# Patient Record
Sex: Female | Born: 1960
Health system: Southern US, Community
[De-identification: ages and names within clinical notes are randomized; demographics above are authoritative.]

## PROBLEM LIST (undated history)

## (undated) DIAGNOSIS — I8390 Asymptomatic varicose veins of unspecified lower extremity: Secondary | ICD-10-CM

## (undated) DIAGNOSIS — D649 Anemia, unspecified: Secondary | ICD-10-CM

## (undated) DIAGNOSIS — R112 Nausea with vomiting, unspecified: Secondary | ICD-10-CM

## (undated) DIAGNOSIS — Z9889 Other specified postprocedural states: Secondary | ICD-10-CM

## (undated) DIAGNOSIS — S76309A Unspecified injury of muscle, fascia and tendon of the posterior muscle group at thigh level, unspecified thigh, initial encounter: Secondary | ICD-10-CM

## (undated) DIAGNOSIS — E669 Obesity, unspecified: Secondary | ICD-10-CM

## (undated) DIAGNOSIS — K30 Functional dyspepsia: Secondary | ICD-10-CM

## (undated) HISTORY — DX: Asymptomatic varicose veins of unspecified lower extremity: I83.90

## (undated) HISTORY — DX: Nausea with vomiting, unspecified: R11.2

## (undated) HISTORY — PX: ENDOMETRIAL ABLATION: SHX621

## (undated) HISTORY — DX: Nausea with vomiting, unspecified: Z98.890

## (undated) HISTORY — DX: Obesity, unspecified: E66.9

## (undated) HISTORY — PX: TONSILLECTOMY: SUR1361

## (undated) HISTORY — DX: Anemia, unspecified: D64.9

## (undated) HISTORY — PX: CERVICAL DISC ARTHROPLASTY: SHX587

## (undated) HISTORY — DX: Unspecified injury of muscle, fascia and tendon of the posterior muscle group at thigh level, unspecified thigh, initial encounter: S76.309A

---
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
## 2002-11-21 DIAGNOSIS — Z9884 Bariatric surgery status: Secondary | ICD-10-CM

## 2002-11-21 HISTORY — DX: Bariatric surgery status: Z98.84

## 2002-11-21 HISTORY — PX: ENDOMETRIAL ABLATION: SHX621

## 2002-11-21 HISTORY — PX: OTHER SURGICAL HISTORY: SHX169

## 2008-01-14 ENCOUNTER — Ambulatory Visit (HOSPITAL_COMMUNITY): Admission: RE | Admit: 2008-01-14 | Discharge: 2008-01-14 | Payer: Self-pay

## 2008-01-29 ENCOUNTER — Ambulatory Visit (HOSPITAL_COMMUNITY): Admission: RE | Admit: 2008-01-29 | Discharge: 2008-01-30 | Payer: Self-pay | Admitting: Neurological Surgery

## 2011-04-05 NOTE — Op Note (Signed)
Kristie Martinez, Kristie Martinez                 ACCOUNT NO.:  [id number]   MEDICAL RECORD NO.:  [id number]          PATIENT TYPE:  INP   LOCATION:  3523                         FACILITY:  [facility]   PHYSICIAN:  Stefani Dama, M.D.  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   PROCEDURE:  Anterior cervical decompression C5-C6 and C6-C7 with partial  corpectomy, C6, arthrodesis with structural allograft, and anterior  plate fixation with an Alphatec plate.   SURGEON:  Stefani Dama, M.D.   FIRST ASSISTANT:  Clydene Fake, M.D.   ANESTHESIA:  General endotracheal.   INDICATIONS:  Kristie Martinez is a 50 year old individual who has had severe  neck, shoulder and left arm pain.  An MRI demonstrates the presence of a  large extruded fragment of disc at C6-C7 behind the body of C6 causing  severe foraminal stenosis and cord compression.  The patient was also  noted to be weak in her right arm and after careful evaluation, it was  noted that she had significant foraminal stenosis on the right side at  C5-C6.  She was advised regarding surgical decompression and was taken  to the operating room to undergo anterior decompression arthrodesis at  C5-C6 and C6-C7 and corpectomy that was initially planned for a full  decompressive corpectomy. At the time of surgery, it was noted that the  disc herniation extruded itself nicely into the disc space and with a  minimal partial corpectomy, I was able to relieve the compression  adequately with the nerve hook being flipped up onto the vertebra of C6.   PROCEDURE NOTE:   The patient was brought to the operating room supine on  the stretcher.  She was placed on the table in a supine position. After  the smooth induction of general endotracheal anesthesia, she was placed  in 5 pounds of halter traction.  Her neck was prepped with alcohol and  DuraPrep and draped in a sterile fashion.  A transverse incision was  made in the base of the neck on the left side and dissection was carried  down through the platysma.  The plane between the sternocleidomastoid  and strap muscles was dissected bluntly until the prevertebral space was  reached.  The first identifiable disc space was noted to be that of the  C6-C7 on a localizing radiograph.  The longus coli muscle was then  stripped off either side of the midline and a self-retaining Caspar  retractor was placed in the wound.  A discectomy was undertaken at C6-  C7.  As the region of the posterior longitudinal ligament was reached,  on the left side, there was noted to be some fragments of disc that were  extruded on the ligament and these were retrieved. Then,  by using small  nerve hooks, some further fragments could be retrieved from this region.  The under surface of the vertebra at C6 was then undercut approximately  3 mm with a high speed bur.  A fine nerve hook could then be slipped  into this area and several other rather large fragments of disc were  retrieved from this region. With this decompression being obtained, the  decompression was obtained across the disc space in a similar fashion.  Attention was then turned to C5-C6 were a similar discectomy was  undertaken and once the region of the posterior longitudinal ligament  was reached, the ligament, itself, was opened and decompression under  the left side of the vertebral body of C6 was undertaken with a small  nerve hook. Several passes yielded no other fragments of disc down to  the vertebral body of C7.  Then, the decompression was undertaken on the   right side at C5-C6 where there was noted to be a substantial bone spur  causing compromise for the right C6 nerve root.  This was drilled down  carefully with a 1 mm Kerrison punch.  The bone fragments were removed.  The nerve root was well decompressed.  Once this was accomplished,  hemostasis was accomplished. Because an incomplete corpectomy was  performed, it was felt that was felt that the interspace could be  stabilized with two standard allografts. These were 8 mm grafts that  were cut and fit to the appropriate size.  They were fit with some  VITOSS bone substitute.  An anterior plate was then affixed with 34 mm  plate using 14 mm screws.  Final alignment was checked radiographically  and was found to be good.  The area was checked for hemostasis and then  the wound was closed with 3-0 Vicryl in the platysma and 3-0 Vicryl  subcuticularly.  The patient tolerated procedure well and was returned  to the recovery room in stable condition.      Stefani Dama, M.D.  Electronically Signed     HJE/MEDQ  D:  01/29/2008  T:  01/30/2008  Job:  [id number]

## 2011-07-15 ENCOUNTER — Inpatient Hospital Stay (INDEPENDENT_AMBULATORY_CARE_PROVIDER_SITE_OTHER)
Admission: RE | Admit: 2011-07-15 | Discharge: 2011-07-15 | Disposition: A | Discharge status: Home / Self Care | Payer: 59 | Source: Ambulatory Visit | Attending: Emergency Medicine | Admitting: Emergency Medicine

## 2011-07-15 DIAGNOSIS — J019 Acute sinusitis, unspecified: Secondary | ICD-10-CM

## 2011-07-15 DIAGNOSIS — J40 Bronchitis, not specified as acute or chronic: Secondary | ICD-10-CM

## 2011-08-15 LAB — BASIC METABOLIC PANEL
BUN: 8
CO2: 27
Chloride: 107
Creatinine, Ser: 0.69
Potassium: 4.4

## 2011-08-15 LAB — CBC
HCT: 38.7
MCHC: 34.5
MCV: 90.3
Platelets: 331
WBC: 8.5

## 2012-05-16 ENCOUNTER — Encounter: Payer: Self-pay | Admitting: Vascular Surgery

## 2012-05-17 ENCOUNTER — Encounter: Payer: Self-pay | Admitting: Vascular Surgery

## 2012-05-17 ENCOUNTER — Ambulatory Visit (INDEPENDENT_AMBULATORY_CARE_PROVIDER_SITE_OTHER): Payer: 59 | Admitting: *Deleted

## 2012-05-17 ENCOUNTER — Ambulatory Visit (INDEPENDENT_AMBULATORY_CARE_PROVIDER_SITE_OTHER): Payer: 59 | Admitting: Vascular Surgery

## 2012-05-17 VITALS — BP 124/86 | HR 93 | Resp 18 | Ht 66.5 in | Wt 172.0 lb

## 2012-05-17 DIAGNOSIS — I83893 Varicose veins of bilateral lower extremities with other complications: Secondary | ICD-10-CM

## 2012-05-17 NOTE — Progress Notes (Signed)
[facility] [place] HISTORY AND PHYSICAL   History of Present Illness:  Patient is a 51 y.o. year old female who presents for evaluation of pain aching and burning of both lower extremities. She has developed several areas of varicose veins in her lower extremities of the last several years. Her primary complaint is a burning and aching in her legs that occurs at the end of the day and disrupture sleep. She has some occasional swelling. The veins are more prominent on some days than others. She works as a Engineer, civil (consulting) in the emergency room and is on her feet most of the day during those shifts.  She has not had any prior interventions in her lower extremities. 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   Past Medical History  Diagnosis Date  . Varicose veins     Past Surgical History  Procedure Date  . Cervical disc arthroplasty      Social History History  Substance Use Topics  . Smoking status: Never Smoker   . Smokeless tobacco: Never Used  . Alcohol Use: Yes    Family History No family history on file.  Allergies  No Known Allergies   Current Outpatient Prescriptions  Medication Sig Dispense Refill  . calcium carbonate (TUMS - DOSED IN MG ELEMENTAL CALCIUM) 500 MG chewable tablet Chew 1 tablet by mouth daily.        ROS:   General:  No weight loss, Fever, chills  HEENT: No recent headaches, no nasal bleeding, no visual changes, no sore throat  Neurologic: No dizziness, blackouts, seizures. No recent symptoms of stroke or mini- stroke. No recent episodes of slurred speech, or temporary blindness.  Cardiac: No recent episodes of chest pain/pressure, no shortness of breath at rest.  No shortness of breath with exertion.  Denies history of atrial fibrillation or irregular heartbeat  Vascular: No history of rest pain in feet.  No history of claudication.  No history of non-healing  ulcer, No history of DVT   Pulmonary: No home oxygen, no productive cough, no hemoptysis,  No asthma or wheezing  Musculoskeletal:  [ ]  Arthritis, [ ]  Low back pain,  [ ]  Joint pain  Hematologic:No history of hypercoagulable state.  No history of easy bleeding.  No history of anemia  Gastrointestinal: No hematochezia or melena,  No gastroesophageal reflux, no trouble swallowing  Urinary: [ ]  chronic Kidney disease, [ ]  on HD - [ ]  MWF or [ ]  TTHS, [ ]  Burning with urination, [ ]  Frequent urination, [ ]  Difficulty urinating;   Skin: No rashes  Psychological: No history of anxiety,  No history of depression   Physical Examination  Filed Vitals:   05/17/12 1246  BP: 124/86  Pulse: 93  Resp: 18  Height: 5' 6.5" (1.689 m)  Weight: 172 lb (78.019 kg)    Body mass index is 27.35 kg/(m^2).  General:  Alert and oriented, no acute distress HEENT: Normal Neck: No bruit or JVD Pulmonary: Clear to auscultation bilaterally Cardiac: Regular Rate and Rhythm without murmur Abdomen: Soft, non-tender, non-distended, no mass Skin: No rash, scattered spider varicosities on the anterior and lateral thigh bilaterally also along the lateral anterior calf, prominent varicosities in the anterior calf area right leg when standing, palpable varicosities in the left popliteal area on standing Extremity Pulses:  2+ radial, brachial, femoral, dorsalis pedis pulses bilaterally Musculoskeletal: No deformity or edema  Neurologic: Upper  and lower extremity motor 5/5 and symmetric  DATA: She had a venous duplex exam today which I reviewed and interpreted. This showed greater saphenous reflux bilaterally. She had very mild deep reflux.   ASSESSMENT:   Symptomatic varicose veins with evidence of greater saphenous reflux bilaterally.   PLAN:  The patient was given a prescription today for 5 high compression stockings bilaterally. She will take Tylenol intermittently for pain relief. She was counseled in  elevating her legs when she has swelling.  She will followup in 3 months time with Dr. Arbie Cookey to consider laser ablation of her saphenous veins bilaterally.  Fabienne Bruns, MD [facility] [place] Office: [phone] Pager: [phone]

## 2012-05-31 NOTE — Procedures (Unsigned)
LOWER EXTREMITY VENOUS REFLUX EXAM  INDICATION:  Bilateral throbbing and ropey varicose veins.  EXAM:  Using color-flow imaging and pulse Doppler spectral analysis, the right and left common femoral, femoral, popliteal, posterior tibial, great and small saphenous veins were evaluated.  There is evidence suggesting deep venous insufficiency in the right and the left lower extremity.  The right and left saphenofemoral junction is not competent with Reflux of >586milliseconds. The right and left greater saphenous vein is not competent with reflux of >52milliseconds with the caliber as described below.   The right and left proximal small saphenous vein demonstrates competency.  GSV Diameter (used if found to be incompetent only)                                           Right    Left Proximal Greater Saphenous Vein           0.89 cm  0.79 cm Proximal-to-mid-thigh                     0.38 cm  0.34 cm Mid thigh                                 0.29 cm  0.42 cm Mid-distal thigh                          0.33 cm  0.29 cm Distal thigh                              cm       cm Knee                                      0.23 cm  0.38 cm  IMPRESSION: 1. Right and left greater saphenous vein is not competent with reflux     of >514milliseconds. 2. The right and left great saphenous vein is not tortuous. 3. The deep venous system is not competent with reflux of     >556milliseconds. 4. The right and left small saphenous vein is competent.  ___________________________________________ Kristie Hora. Fields, MD  EM/MEDQ  D:  05/17/2012  T:  05/17/2012  Job:  [id number]

## 2012-08-21 ENCOUNTER — Encounter: Payer: Self-pay | Admitting: Vascular Surgery

## 2012-08-21 ENCOUNTER — Ambulatory Visit (INDEPENDENT_AMBULATORY_CARE_PROVIDER_SITE_OTHER): Payer: 59 | Admitting: Vascular Surgery

## 2012-08-21 VITALS — BP 112/72 | HR 73 | Resp 18 | Ht 66.0 in | Wt 179.2 lb

## 2012-08-21 DIAGNOSIS — I83893 Varicose veins of bilateral lower extremities with other complications: Secondary | ICD-10-CM

## 2012-08-21 NOTE — Progress Notes (Signed)
Problems with Activities of Daily Living Secondary to Leg Pain  1. Kristie Martinez has had to stop walking for exercise due to leg pain.  2. Kristie Martinez is unable to walk the stairs at work due to leg pain.  3. 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    Failure of  Conservative Therapy:  1. Worn 20-30 mm Hg thigh high compression hose >3 months with no relief of symptoms.  2. Frequently elevates legs-no relief of symptoms  3. Taken Ibuprofen 600 Mg TID with no relief of symptoms.  The patient has today for further discussion regarding her symptomatic venous hypertension. 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 She reports that she has continued to have aching discomfort in her calves. This is with prolonged standing she hasn't radial aching and she's been up on her feet it during the night. Reviewed her venous duplex in her reimage her veins with SonoSite. This does show enlarged refluxing great saphenous vein bilaterally. On the left side she has typical anatomy on the right side she has a bifurcated system with a more superficial and a more standard location of her great saphenous vein on the right. I recommended ablation of the standard anatomic positioning of her great saphenous vein on the right since this appears to be responsible for the reflux into the tributaries in her calf. We would then have a staged left great saphenous vein laser ablation. She understands this is an outpatient in our office under local anesthesia. We discussed the procedure including a very slight risk for DVT. She wish to proceed as soon as we can get her on the schedule

## 2012-09-04 ENCOUNTER — Other Ambulatory Visit: Payer: Self-pay | Admitting: *Deleted

## 2012-09-04 DIAGNOSIS — I83893 Varicose veins of bilateral lower extremities with other complications: Secondary | ICD-10-CM

## 2012-09-26 ENCOUNTER — Encounter: Payer: Self-pay | Admitting: Vascular Surgery

## 2012-09-27 ENCOUNTER — Ambulatory Visit (INDEPENDENT_AMBULATORY_CARE_PROVIDER_SITE_OTHER): Payer: 59 | Admitting: Vascular Surgery

## 2012-09-27 ENCOUNTER — Encounter: Payer: Self-pay | Admitting: Vascular Surgery

## 2012-09-27 VITALS — BP 112/76 | HR 73 | Resp 18 | Ht 66.0 in | Wt 178.0 lb

## 2012-09-27 DIAGNOSIS — I83893 Varicose veins of bilateral lower extremities with other complications: Secondary | ICD-10-CM

## 2012-09-27 HISTORY — PX: ENDOVENOUS ABLATION SAPHENOUS VEIN W/ LASER: SUR449

## 2012-09-27 NOTE — Progress Notes (Signed)
Laser Ablation Procedure      Date: 09/27/2012    Kristie Martinez DOB:1961-03-08  Consent signed: Yes  Surgeon:T.F. Clary Meeker  Procedure: Laser Ablation: left Greater Saphenous Vein  BP 112/76  Pulse 73  Resp 18  Ht 5\' 6"  (1.676 m)  Wt 178 lb (80.74 kg)  BMI 28.73 kg/m2  Start time: 10:50AM   End time: 11:50AM  Tumescent Anesthesia: 475 cc 0.9% NaCl with 50 cc Lidocaine HCL with 1% Epi and 15 cc 8.4% NaHCO3  Local Anesthesia: 3 cc Lidocaine HCL and NaHCO3 (ratio 2:1)  Continuous Mode: 15 Watts Total Energy 2524 Joules Total Time2:48       Patient tolerated procedure well: Yes    Description of Procedure:  After marking the course of the saphenous vein and the secondary varicosities in the standing position, the patient was placed on the operating table in the supine position, and the left leg was prepped and draped in sterile fashion. Local anesthetic was administered, and under ultrasound guidance the saphenous vein was accessed with a micro needle and guide wire; then the micro puncture sheath was placed. A guide wire was inserted to the saphenofemoral junction, followed by a 5 french sheath.  The position of the sheath and then the laser fiber below the junction was confirmed using the ultrasound and visualization of the aiming beam.  Tumescent anesthesia was administered along the course of the saphenous vein using ultrasound guidance. Protective laser glasses were placed on the patient, and the laser was fired at at 15 watt continuous mode.  For a total of 2524 joules.  A steri strip was applied to the puncture site.      ABD pads and thigh high compression stockings were applied.  Ace wrap bandages were applied  at the top of the saphenofemoral junction.  Blood loss was less than 15 cc.  The patient ambulated out of the operating room having tolerated the procedure well.

## 2012-10-01 ENCOUNTER — Encounter: Payer: Self-pay | Admitting: Vascular Surgery

## 2012-10-02 ENCOUNTER — Telehealth: Payer: Self-pay | Admitting: *Deleted

## 2012-10-02 NOTE — Telephone Encounter (Signed)
10/02/2012  Time: 8:38 AM   Patient Name: Kristie Martinez  Patient of: T.F. Early  Procedure:Laser Ablation left  Greater saphenous vein 09-27-2012 Reached patient at home and checked  Her status  Yes    Comments/Actions Taken: [name] has no  compliants of swelling.  Has occasional throbbing pain over left inner thigh relieved by Ibuprofen.  Slept well. 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  Reviewed post procedural instructions.  Reminded her of post laser ablation duplex and follow up appointment with Dr. Arbie Cookey on 10-04-2012.    @SIGNATURE @

## 2012-10-03 ENCOUNTER — Encounter: Payer: Self-pay | Admitting: Vascular Surgery

## 2012-10-04 ENCOUNTER — Ambulatory Visit (INDEPENDENT_AMBULATORY_CARE_PROVIDER_SITE_OTHER): Payer: 59 | Admitting: Vascular Surgery

## 2012-10-04 ENCOUNTER — Encounter (INDEPENDENT_AMBULATORY_CARE_PROVIDER_SITE_OTHER): Payer: 59 | Admitting: *Deleted

## 2012-10-04 ENCOUNTER — Encounter: Payer: Self-pay | Admitting: Vascular Surgery

## 2012-10-04 VITALS — BP 118/80 | HR 87 | Resp 18 | Ht 66.0 in | Wt 178.0 lb

## 2012-10-04 DIAGNOSIS — I83893 Varicose veins of bilateral lower extremities with other complications: Secondary | ICD-10-CM

## 2012-10-04 NOTE — Progress Notes (Signed)
The patient presents today for followup of her left leg laser ablation one week ago. She had minimal bruising and mild discomfort following the procedure. She has been compliant with her compression garments.  Physical exam reveals mild bruising in the mid thigh. There is some thickening as expected at her thrombosed saphenous vein at the level of the knee.  Venous duplex today he was evaluated interpreter myself. This does show closure of the great saphenous vein from the insertion site just below the knee to partially 1 cm below the saphenofemoral junction. There is no evidence of DVT  Impression and plan: Successful ablation of left great saphenous vein for venous hypertension. She is scheduled for similar treatment in her right leg in the mid December. She will continue her compression garment on the left leg for one additional week and then discontinue this use.

## 2012-11-07 ENCOUNTER — Encounter: Payer: Self-pay | Admitting: Vascular Surgery

## 2012-11-08 ENCOUNTER — Ambulatory Visit (INDEPENDENT_AMBULATORY_CARE_PROVIDER_SITE_OTHER): Payer: 59 | Admitting: Vascular Surgery

## 2012-11-08 ENCOUNTER — Encounter: Payer: Self-pay | Admitting: Vascular Surgery

## 2012-11-08 VITALS — BP 106/73 | HR 72 | Resp 18 | Ht 66.0 in | Wt 178.0 lb

## 2012-11-08 DIAGNOSIS — I83893 Varicose veins of bilateral lower extremities with other complications: Secondary | ICD-10-CM

## 2012-11-08 HISTORY — PX: ENDOVENOUS ABLATION SAPHENOUS VEIN W/ LASER: SUR449

## 2012-11-08 NOTE — Progress Notes (Signed)
Laser Ablation Procedure      Date: 11/08/2012    Kristie Martinez DOB:[birth year]  Consent signed: Yes  Surgeon:T.F. Early  Procedure: Laser Ablation: right Greater Saphenous Vein  BP 106/73  Pulse 72  Resp 18  Ht 5\' 6"  (1.676 m)  Wt 178 lb (80.74 kg)  BMI 28.73 kg/m2  Start time: 10:50AM   End time: 11:55AM  Tumescent Anesthesia: 425 cc 0.9% NaCl with 50 cc Lidocaine HCL with 1% Epi and 15 cc 8.4% NaHCO3  Local Anesthesia: 4 cc Lidocaine HCL and NaHCO3 (ratio 2:1)  Continuous Mode: 15 Watts Total Energy 1908 Joules Total Time2:07       Patient tolerated procedure well: Yes    Description of Procedure:  After marking the course of the saphenous vein and the secondary varicosities in the standing position, the patient was placed on the operating table in the supine position, and the right leg was prepped and draped in sterile fashion. Local anesthetic was administered, and under ultrasound guidance the saphenous vein was accessed with a micro needle and guide wire; then the micro puncture sheath was placed. A guide wire was inserted to the saphenofemoral junction, followed by a 5 french sheath.  The position of the sheath and then the laser fiber below the junction was confirmed using the ultrasound and visualization of the aiming beam.  Tumescent anesthesia was administered along the course of the saphenous vein using ultrasound guidance. Protective laser glasses were placed on the patient, and the laser was fired at at 15 watt continuous mode.  For a total of 1908 joules.  A steri strip was applied to the puncture site.    ABD pads and thigh high compression stockings were applied.  Ace wrap bandages were applied  at the top of the saphenofemoral junction.  Blood loss was less than 15 cc.  The patient ambulated out of the operating room having tolerated the procedure well.  The patient had an eventful laser ablation of her right saphenous vein. She had a similar treatment of  her left great saphenous vein several weeks ago. She had multiple branching pattern of her saphenous vein right after the saphenofemoral junction. The most prominent of this it was heading down to the area of discomfort was chosen and she had a treatment from just at the level of the knee to just below the saphenofemoral junction. She will see [place] in one week for repeat duplex to confirm closure of this saphenous vein

## 2012-11-09 ENCOUNTER — Encounter: Payer: Self-pay | Admitting: Surgery

## 2012-11-09 ENCOUNTER — Telehealth: Payer: Self-pay | Admitting: *Deleted

## 2012-11-09 NOTE — Telephone Encounter (Signed)
11/09/2012  Time: 11:20 AM   Patient Name: Kristie Martinez  Patient of: T.F. Early  Procedure:Laser Ablation right greater saphenous vein  11-08-2012  Reached patient at home and checked  Her status  Yes    Comments/Actions Taken: Mrs. Lorey states no problems with pain or swelling.  She states she noticed a small amount of bleeding around the insertion site(right inner knee) which was controlled and stopped with compression.  Reviewed post procedural instructions with her and reminded her of post laser ablation duplex and follow up appointment with Dr. Myra Gianotti on 11-12-2012.     @SIGNATURE @

## 2012-11-12 ENCOUNTER — Ambulatory Visit (INDEPENDENT_AMBULATORY_CARE_PROVIDER_SITE_OTHER): Payer: 59 | Admitting: Surgery

## 2012-11-12 ENCOUNTER — Encounter (INDEPENDENT_AMBULATORY_CARE_PROVIDER_SITE_OTHER): Payer: 59 | Admitting: *Deleted

## 2012-11-12 ENCOUNTER — Encounter: Payer: Self-pay | Admitting: Surgery

## 2012-11-12 ENCOUNTER — Ambulatory Visit: Payer: 59 | Admitting: Surgery

## 2012-11-12 VITALS — BP 119/77 | HR 75 | Ht 66.0 in | Wt 184.0 lb

## 2012-11-12 DIAGNOSIS — Z48812 Encounter for surgical aftercare following surgery on the circulatory system: Secondary | ICD-10-CM

## 2012-11-12 DIAGNOSIS — I83893 Varicose veins of bilateral lower extremities with other complications: Secondary | ICD-10-CM

## 2012-11-12 NOTE — Progress Notes (Signed)
The patient is one-week status post laser ablation of her right great saphenous vein. She is previously undergone laser ablation of the left great saphenous vein. These procedures were done by Dr. early. The right leg was done 1 week ago. This is her followup visit. She states that her right leg hurt significantly less than the left. She has been wearing her compression garments.  On physical examination there is mild ecchymosis in the mid side. There is minimal tenderness.  Duplex ultrasound was reviewed by myself. This shows ablation of the great saphenous vein up to 1.8 cm distal to the saphenofemoral junction. There are additional branches of the saphenous vein which remained patent but did not show any evidence of reflux.  The patient has been encouraged to wear her compression garments. She will monitor the varicosities in the left leg and contact us within 3-6 months if she wishes to have these injected.

## 2012-11-20 ENCOUNTER — Other Ambulatory Visit: Payer: 59 | Admitting: Vascular Surgery

## 2013-01-05 ENCOUNTER — Other Ambulatory Visit: Payer: Self-pay

## 2013-01-18 ENCOUNTER — Emergency Department (HOSPITAL_COMMUNITY)
Admission: EM | Admit: 2013-01-18 | Discharge: 2013-01-18 | Disposition: A | Discharge status: Home / Self Care | Payer: 59 | Source: Home / Self Care | Attending: Family Medicine | Admitting: Family Medicine

## 2013-01-18 ENCOUNTER — Encounter (HOSPITAL_COMMUNITY): Payer: Self-pay | Admitting: Emergency Medicine

## 2013-01-18 DIAGNOSIS — S76011A Strain of muscle, fascia and tendon of right hip, initial encounter: Secondary | ICD-10-CM

## 2013-01-18 DIAGNOSIS — IMO0002 Reserved for concepts with insufficient information to code with codable children: Secondary | ICD-10-CM

## 2013-01-18 MED ORDER — METHYLPREDNISOLONE 4 MG PO KIT: PACK | ORAL | Status: DC

## 2013-01-18 MED ORDER — CYCLOBENZAPRINE HCL 5 MG PO TABS: 5.0000 mg | ORAL_TABLET | Freq: Three times a day (TID) | ORAL | Status: DC | PRN

## 2013-01-18 NOTE — ED Notes (Signed)
Right buttocks pain worsening over the past few weeks.  No complaints while standing or during exercises.  Notices pain with squatting, sitting, or crossing legs .  Several weeks ago recalls assisting her mother, who had fallen.  Had pain, pain went away, but pain is returning and increasing.

## 2013-01-18 NOTE — ED Provider Notes (Signed)
History     CSN: [id number]  Arrival date & time 01/18/13  1147   First MD Initiated Contact with Patient 01/18/13 1152      Chief Complaint  Patient presents with  . Back Pain    (Consider location/radiation/quality/duration/timing/severity/associated sxs/prior treatment) Patient is a 52 y.o. female presenting with back pain. The history is provided by the patient.  Back Pain Location:  Gluteal region Quality:  Shooting Radiates to:  Does not radiate Pain severity:  Mild Onset quality:  Gradual (onset lifting mother up who had fallen 2 weeks ago,) Duration:  2 weeks Timing:  Intermittent Progression:  Waxing and waning Chronicity:  New Context comment:  Worse with heat and certain positions, able to exercise,, no neuro sx, no leg swelling, no back pain, Relieved by:  Heating pad and NSAIDs Ineffective treatments:  NSAIDs Associated symptoms: no abdominal pain, no bladder incontinence, no bowel incontinence, no numbness, no paresthesias and no pelvic pain     Past Medical History  Diagnosis Date  . Varicose veins     Past Surgical History  Procedure Laterality Date  . Cervical disc arthroplasty    . Mini gastric  2004  . Endovenous ablation saphenous vein w/ laser  09-27-2012    left greater saphenous vein  by Gretta Began MD  . Endovenous ablation saphenous vein w/ laser  11-08-2012    right greater saphenous vein   by Gretta Began MD    No family history on file.  History  Substance Use Topics  . Smoking status: Never Smoker   . Smokeless tobacco: Never Used  . Alcohol Use: Yes    OB History   Grav Para Term Preterm Abortions TAB SAB Ect Mult Living                  Review of Systems  Constitutional: Negative.   Gastrointestinal: Negative.  Negative for abdominal pain and bowel incontinence.  Genitourinary: Negative for bladder incontinence and pelvic pain.  Musculoskeletal: Positive for back pain.  Skin: Negative.   Neurological: Negative for  numbness and paresthesias.    Allergies  Review of patient's allergies indicates no known allergies.  Home Medications   Current Outpatient Rx  Name  Route  Sig  Dispense  Refill  . meloxicam (MOBIC) 7.5 MG tablet   Oral   Take 7.5 mg by mouth daily.         . calcium carbonate (TUMS - DOSED IN MG ELEMENTAL CALCIUM) 500 MG chewable tablet   Oral   Chew 1 tablet by mouth daily.         . cyclobenzaprine (FLEXERIL) 5 MG tablet   Oral   Take 1 tablet (5 mg total) by mouth 3 (three) times daily as needed for muscle spasms.   30 tablet   0   . methylcellulose oral powder   Oral   Take by mouth.         . methylPREDNISolone (MEDROL DOSEPAK) 4 MG tablet      follow package directions, start today and take until finished.   21 tablet   0     BP 104/58  Pulse 95  Temp(Src) 98.1 F (36.7 C) (Oral)  Resp 20  SpO2 98%  Physical Exam  Nursing note and vitals reviewed. Constitutional: She is oriented to person, place, and time. She appears well-developed and well-nourished. No distress.  Abdominal: Soft. Bowel sounds are normal.  Musculoskeletal: She exhibits tenderness.  Right gluteal soreness, increased with stretch maneuvers.  Pulses 2+,   Neurological: She is alert and oriented to person, place, and time. She has normal reflexes. No cranial nerve deficit. Coordination normal.  Skin: Skin is warm and dry.    ED Course  Procedures (including critical care time)  Labs Reviewed - No data to display No results found.   1. Muscle strain of gluteal region, right, initial encounter       MDM          Linna Hoff, MD 01/18/13 1240

## 2013-03-07 DIAGNOSIS — Z9884 Bariatric surgery status: Secondary | ICD-10-CM | POA: Insufficient documentation

## 2013-09-26 ENCOUNTER — Other Ambulatory Visit: Payer: Self-pay

## 2015-12-30 MED FILL — PHENTERMINE 37.5 MG TABLET: 37.5 | 30 days supply | Qty: 30 | Fill #0

## 2016-01-13 ENCOUNTER — Telehealth: Payer: 59 | Admitting: Physician Assistant

## 2016-01-13 DIAGNOSIS — J019 Acute sinusitis, unspecified: Secondary | ICD-10-CM

## 2016-01-14 MED ORDER — AMOXICILLIN-POT CLAVULANATE 875-125 MG PO TABS: 1.0000 | ORAL_TABLET | Freq: Two times a day (BID) | ORAL | Status: DC

## 2016-01-14 NOTE — Progress Notes (Signed)

## 2016-01-19 ENCOUNTER — Telehealth: Payer: 59 | Admitting: Family

## 2016-01-19 DIAGNOSIS — R112 Nausea with vomiting, unspecified: Secondary | ICD-10-CM

## 2016-01-19 MED ORDER — PROMETHAZINE HCL 12.5 MG PO TABS: 12.5000 mg | ORAL_TABLET | Freq: Three times a day (TID) | ORAL | Status: DC | PRN

## 2016-01-19 MED FILL — PROMETHAZINE 12.5 MG TABLET: 12.5 | 5 days supply | Qty: 30 | Fill #0

## 2016-01-19 NOTE — Progress Notes (Signed)
We are sorry that you are not feeling well.  Here is how we plan to help!  Based on what you have shared with me it looks like you have Acute Infectious Diarrhea.  Most cases of acute diarrhea are due to infections with virus and bacteria and are self-limited conditions lasting less than 14 days.  For your symptoms you may take Imodium 2 mg tablets that are over the counter at your local pharmacy. Take two tablet now and then one after each loose stool up to 6 a day.  Antibiotics are not needed for most people with diarrhea.  As we discussed via telephone, I have e-prescribed phenergan 12.5mg , take 1 to 2 tabs by mouth every eight hours as needed for nausea or vomiting.  Marland Kitchen  HOME CARE  We recommend changing your diet to help with your symptoms for the next few days.  Drink plenty of fluids that contain water salt and sugar. Sports drinks such as Gatorade may help.   You may try broths, soups, bananas, applesauce, soft breads, mashed potatoes or crackers.   You are considered infectious for as long as the diarrhea continues. Hand washing or use of alcohol based hand sanitizers is recommend.  It is best to stay out of work or school until your symptoms stop.   GET HELP RIGHT AWAY  If you have dark yellow colored urine or do not pass urine frequently you should drink more fluids.    If your symptoms worsen   If you feel like you are going to pass out (faint)  You have a new problem  MAKE SURE YOU   Understand these instructions.  Will watch your condition.  Will get help right away if you are not doing well or get worse.  Your e-visit answers were reviewed by a board certified advanced clinical practitioner to complete your personal care plan.  Depending on the condition, your plan could have included both over the counter or prescription medications.  If there is a problem please reply  once you have received a response from your provider.  Your safety is important to [place].   If you have drug allergies check your prescription carefully.    You can use MyChart to ask questions about today's visit, request a non-urgent call back, or ask for a work or school excuse for 24 hours related to this e-Visit. If it has been greater than 24 hours you will need to follow up with your provider, or enter a new e-Visit to address those concerns.   You will get an e-mail in the next two days asking about your experience.  I hope that your e-visit has been valuable and will speed your recovery. Thank you for using e-visits.

## 2016-02-11 MED FILL — PHENTERMINE 37.5 MG TABLET: 37.5 | 30 days supply | Qty: 30 | Fill #1

## 2016-03-03 DIAGNOSIS — B309 Viral conjunctivitis, unspecified: Secondary | ICD-10-CM | POA: Diagnosis not present

## 2016-03-03 MED FILL — NEO/POLYMYXIN/DEXAMETH DROP: 3.5-10000-0 | 15 days supply | Qty: 5 | Fill #0

## 2016-03-15 DIAGNOSIS — B309 Viral conjunctivitis, unspecified: Secondary | ICD-10-CM | POA: Diagnosis not present

## 2016-03-22 MED FILL — PHENTERMINE 37.5 MG TABLET: 37.5 | 30 days supply | Qty: 30 | Fill #0

## 2016-03-22 MED FILL — NEO/POLYMYXIN/DEXAMETH DROP: 3.5-10000-0 | 25 days supply | Qty: 5 | Fill #0

## 2016-05-30 DIAGNOSIS — H5213 Myopia, bilateral: Secondary | ICD-10-CM | POA: Diagnosis not present

## 2016-06-14 MED FILL — PHENTERMINE 37.5 MG TABLET: 37.5 | 30 days supply | Qty: 30 | Fill #0

## 2016-07-18 MED FILL — PHENTERMINE 37.5 MG TABLET: 37.5 | 30 days supply | Qty: 30 | Fill #1

## 2016-10-12 MED FILL — PHENTERMINE 37.5 MG TABLET: 37.5 | 30 days supply | Qty: 30 | Fill #0

## 2016-11-03 ENCOUNTER — Encounter: Payer: Self-pay | Admitting: *Deleted

## 2016-11-09 ENCOUNTER — Ambulatory Visit (INDEPENDENT_AMBULATORY_CARE_PROVIDER_SITE_OTHER): Payer: Self-pay | Admitting: *Deleted

## 2016-11-09 ENCOUNTER — Encounter: Payer: Self-pay | Admitting: Vascular Surgery

## 2016-11-09 DIAGNOSIS — I868 Varicose veins of other specified sites: Secondary | ICD-10-CM

## 2016-11-09 DIAGNOSIS — I83893 Varicose veins of bilateral lower extremities with other complications: Secondary | ICD-10-CM

## 2016-11-09 NOTE — Progress Notes (Signed)
X=.3%-.4% Sotradecol administered with a 27g butterfly.  Patient received a total of 12cc.  Treated large reticular that goes from calf to lateral right thigh. Easy access. Tol well. Also treated spiders above the retic. She will return in March for clean up and left leg tx.    Compression stockings applied: Yes.

## 2016-11-17 ENCOUNTER — Observation Stay (HOSPITAL_COMMUNITY)
Admission: EM | Admit: 2016-11-17 | Discharge: 2016-11-18 | Disposition: A | Discharge status: Home / Self Care | Payer: 59 | Attending: Family Medicine | Admitting: Family Medicine

## 2016-11-17 ENCOUNTER — Encounter (HOSPITAL_COMMUNITY): Payer: Self-pay | Admitting: Emergency Medicine

## 2016-11-17 ENCOUNTER — Observation Stay (HOSPITAL_COMMUNITY): Payer: 59

## 2016-11-17 DIAGNOSIS — R0789 Other chest pain: Principal | ICD-10-CM | POA: Insufficient documentation

## 2016-11-17 DIAGNOSIS — Z8249 Family history of ischemic heart disease and other diseases of the circulatory system: Secondary | ICD-10-CM | POA: Diagnosis not present

## 2016-11-17 DIAGNOSIS — R9431 Abnormal electrocardiogram [ECG] [EKG]: Secondary | ICD-10-CM

## 2016-11-17 DIAGNOSIS — R002 Palpitations: Secondary | ICD-10-CM | POA: Diagnosis not present

## 2016-11-17 DIAGNOSIS — R7989 Other specified abnormal findings of blood chemistry: Secondary | ICD-10-CM | POA: Diagnosis not present

## 2016-11-17 DIAGNOSIS — I4891 Unspecified atrial fibrillation: Secondary | ICD-10-CM | POA: Diagnosis not present

## 2016-11-17 DIAGNOSIS — R079 Chest pain, unspecified: Secondary | ICD-10-CM | POA: Diagnosis present

## 2016-11-17 DIAGNOSIS — Z7982 Long term (current) use of aspirin: Secondary | ICD-10-CM | POA: Insufficient documentation

## 2016-11-17 DIAGNOSIS — K219 Gastro-esophageal reflux disease without esophagitis: Secondary | ICD-10-CM | POA: Diagnosis not present

## 2016-11-17 DIAGNOSIS — R110 Nausea: Secondary | ICD-10-CM | POA: Diagnosis not present

## 2016-11-17 HISTORY — DX: Functional dyspepsia: K30

## 2016-11-17 LAB — CBC
HEMATOCRIT: 39.2 % (ref 36.0–46.0)
Hemoglobin: 12.7 g/dL (ref 12.0–15.0)
MCH: 28.3 pg (ref 26.0–34.0)
MCHC: 32.4 g/dL (ref 30.0–36.0)
MCV: 87.3 fL (ref 78.0–100.0)
Platelets: 312 10*3/uL (ref 150–400)
RBC: 4.49 MIL/uL (ref 3.87–5.11)
RDW: 13 % (ref 11.5–15.5)
WBC: 6.9 10*3/uL (ref 4.0–10.5)

## 2016-11-17 LAB — BASIC METABOLIC PANEL
Anion gap: 9 (ref 5–15)
BUN: 8 mg/dL (ref 6–20)
CALCIUM: 9.4 mg/dL (ref 8.9–10.3)
CO2: 26 mmol/L (ref 22–32)
CREATININE: 0.71 mg/dL (ref 0.44–1.00)
Chloride: 103 mmol/L (ref 101–111)
GFR calc Af Amer: >60 mL/min (ref >60)
GFR calc non Af Amer: >60 mL/min (ref >60)
GLUCOSE: 85 mg/dL (ref 65–99)
Potassium: 3.8 mmol/L (ref 3.5–5.1)
Sodium: 138 mmol/L (ref 135–145)

## 2016-11-17 LAB — TROPONIN I
Troponin I: <0.03 ng/mL (ref <0.03)
Troponin I: <0.03 ng/mL (ref <0.03)

## 2016-11-17 LAB — CBG MONITORING, ED: Glucose-Capillary: 94 mg/dL (ref 65–99)

## 2016-11-17 LAB — I-STAT TROPONIN, ED: Troponin i, poc: 0.01 ng/mL (ref 0.00–0.08)

## 2016-11-17 MED ORDER — ACETAMINOPHEN 325 MG PO TABS: 650.0000 mg | ORAL_TABLET | ORAL | Status: DC | PRN

## 2016-11-17 MED ORDER — PANTOPRAZOLE SODIUM 20 MG PO TBEC
20.0000 mg | DELAYED_RELEASE_TABLET | Freq: Every day | ORAL | Status: DC
  Administered 2016-11-17: 20 mg via ORAL
  Filled 2016-11-17: qty 1

## 2016-11-17 MED ORDER — ASPIRIN EC 325 MG PO TBEC
325.0000 mg | DELAYED_RELEASE_TABLET | Freq: Every day | ORAL | Status: DC
  Administered 2016-11-18: 325 mg via ORAL
  Filled 2016-11-17: qty 1

## 2016-11-17 MED ORDER — ENOXAPARIN SODIUM 40 MG/0.4ML ~~LOC~~ SOLN
40.0000 mg | SUBCUTANEOUS | Status: DC
  Administered 2016-11-17: 40 mg via SUBCUTANEOUS

## 2016-11-17 MED ORDER — ONDANSETRON HCL 4 MG/2ML IJ SOLN: 4.0000 mg | Freq: Four times a day (QID) | INTRAMUSCULAR | Status: DC | PRN

## 2016-11-17 MED ORDER — GI COCKTAIL ~~LOC~~
30.0000 mL | Freq: Four times a day (QID) | ORAL | Status: DC | PRN
Start: 2016-11-17 — End: 2016-11-18
  Administered 2016-11-18: 30 mL via ORAL
  Filled 2016-11-17: qty 30

## 2016-11-17 MED ORDER — PANTOPRAZOLE SODIUM 40 MG PO TBEC
40.0000 mg | DELAYED_RELEASE_TABLET | Freq: Two times a day (BID) | ORAL | Status: DC
  Administered 2016-11-18: 40 mg via ORAL
  Filled 2016-11-17: qty 1

## 2016-11-17 MED ORDER — GI COCKTAIL ~~LOC~~
30.0000 mL | Freq: Once | ORAL | Status: AC
  Administered 2016-11-17: 30 mL via ORAL
  Filled 2016-11-17: qty 30

## 2016-11-17 MED ORDER — ASPIRIN 81 MG PO CHEW
324.0000 mg | CHEWABLE_TABLET | Freq: Once | ORAL | Status: AC
  Administered 2016-11-17: 324 mg via ORAL
  Filled 2016-11-17: qty 4

## 2016-11-17 NOTE — ED Notes (Signed)
Pt taken to xray 

## 2016-11-17 NOTE — Progress Notes (Signed)
Nurse making rounds and writer explained to patient that Lovenox is her VTE and has gotten today's dose. Patient denied ever receiving Lovenox shot today. Documentation said patient received the medication at 1800. Patient alert and oriented. Charge nurse notified about patient concern. Charge nurse called the day shift nurse that documented the administration of the Lovenox but did not get any response.Nurse went back and updated patient.

## 2016-11-17 NOTE — H&P (Signed)
Family Medicine Teaching [facility] Admission History and Physical Service Pager: [phone]  Patient name: Kristie Martinez Medical record number: [id number] Date of birth: 07-06-1961 Age: 55 y.o. Gender: female  Primary Care Provider: Vernona Rieger, MD Consultants: None Code Status: Full  Chief Complaint: chest pain  Assessment and Plan: Kristie Martinez is a 55 y.o. female presenting with chest pain . PMH is significant for varicose veins.  Chest Pain- had an episode of chest pain accompanied by diaphoresis, feeling hot, radiated to back while at rest. Also endorsed palpitations at this time. She has noted intermittent left arm pain throughout today. Resolved after 20 minutes. Patient also endorsing burning feeling in throat. Heart Score 4. CXR with no acute cardiopulmonary process. Possibly cardiac related but may also have GI etiology vs related to her past cervical surgery. Troponin 0.01 in ED. EKG with changes from prior study in 2009 with new Q waves in anteroseptal leads. No previous EKGs documented in Epic, but patient reports she had normal EKG in 2009. Also reports Stress Test in 2009, which was normal.  - Place in observation attending Dr. McDiarmid - Vitals per floor - Continuous cardiac monitoring - Repeat EKG in morning - Trend troponin  - am CBC, BMP - Consider cardiology consult in morning due to patient's strong FH - Will start PPI - GI cocktail prn - Aspirin 325 daily. Anticipate transitioning to 81mg  dosing at discharge.  FEN/GI: regular diet, PPI Prophylaxis: lovenox  Disposition: pending clinical improvement  History of Present Illness:  Kristie Martinez is a 55 y.o. female presenting with chest pain.  Patient is a Engineer, civil (consulting) and teaches EKG classes. Was sitting at work, felt hot and started sweating and experiencing left sided chest pain that radiated to her back along with palpitations. She tried tums with no relief. Tried walking around with no relief. Felt aching  pain in her left arm earlier that day. Chest pain resolved after about 20 minutes. Patient and coworker (also a Engineer, civil (consulting)) were concerned and she came to ED for evaluation. Currently describing a heaviness in her chest but no acute pain. Endorses a lot of stress recently in her life. Has a strong FH of MI- brother had 3 cessel CABG at age 63 and has had several MI's. Father died from MI at 71. Mother had 2 stents placed and ultimately passed away at 86 from MI. Patient had cardiac work up (exercise treadmill test) in 2009 with normal EKG. She has had chest pain in past but it was related to cervical disc bulging in C3-C5 that she had an operation on. Has since had neck pain on and off. Has had reflux pain in past especially at night. Recently had varicose vein injection in right leg 1 week ago.   Review Of Systems: Per HPI with the following additions:   Review of Systems  Constitutional: Positive for diaphoresis. Negative for chills and fever.  Respiratory: Negative for cough and shortness of breath.   Cardiovascular: Positive for chest pain and palpitations. Negative for orthopnea and PND.  Gastrointestinal: Positive for heartburn and nausea. Negative for abdominal pain and vomiting.  Musculoskeletal: Positive for neck pain.  Neurological: Negative for dizziness and headaches.  Psychiatric/Behavioral: Negative for substance abuse.    Patient Active Problem List   Diagnosis Date Noted  . Chest pain 11/17/2016  . Varicose veins of lower extremities with other complications 05/17/2012   Past Medical History: Past Medical History:  Diagnosis Date  . Varicose veins  Past Surgical History: Past Surgical History:  Procedure Laterality Date  . CERVICAL DISC ARTHROPLASTY    . ENDOVENOUS ABLATION SAPHENOUS VEIN W/ LASER  09-27-2012   left greater saphenous vein  by Gretta Beganodd Early MD  . ENDOVENOUS ABLATION SAPHENOUS VEIN W/ LASER  11-08-2012   right greater saphenous vein   by Gretta Beganodd Early MD  .  mini gastric  2004    Social History: Social History  Substance Use Topics  . Smoking status: Never Smoker  . Smokeless tobacco: Never Used  . Alcohol use Yes   Additional social history: nurse, teaches EKG classes Please also refer to relevant sections of EMR.  Family History: No family history on file. Strong FH of heart disease and early MI in family members  Allergies and Medications: No Known Allergies No current facility-administered medications on file prior to encounter.    Current Outpatient Prescriptions on File Prior to Encounter  Medication Sig Dispense Refill  . amoxicillin-clavulanate (AUGMENTIN) 875-125 MG tablet Take 1 tablet by mouth 2 (two) times daily. 14 tablet 0  . calcium carbonate (TUMS - DOSED IN MG ELEMENTAL CALCIUM) 500 MG chewable tablet Chew 1 tablet by mouth daily.    . cyclobenzaprine (FLEXERIL) 5 MG tablet Take 1 tablet (5 mg total) by mouth 3 (three) times daily as needed for muscle spasms. 30 tablet 0  . meloxicam (MOBIC) 7.5 MG tablet Take 7.5 mg by mouth daily.    . methylcellulose oral powder Take by mouth.    . methylPREDNISolone (MEDROL DOSEPAK) 4 MG tablet follow package directions, start today and take until finished. 21 tablet 0  . promethazine (PHENERGAN) 12.5 MG tablet Take 1-2 tablets (12.5-25 mg total) by mouth every 8 (eight) hours as needed for nausea or vomiting. 30 tablet 0    Objective: BP 119/78   Pulse 75   Temp 97.6 F (36.4 C) (Oral)   Resp 12   Ht 5\' 6"  (1.676 m)   Wt 194 lb (88 kg)   SpO2 97%   BMI 31.31 kg/m  Exam: General: pleasant lady sitting up in bed in NAD Eyes: EOMI. PERRLA ENTM: moist mucous membranes, good dentition, no erythema or exudate in posterior oropharynx Neck: restricted ROM  Cardiovascular: RRR no MRG. +2 radial and dorsalis pedis pulses bilaterally Respiratory: CTA bilaterally with no increased work of breathing Gastrointestinal: +BS. Non-tender, non-distended MSK: no edema or cyanosis  noted Derm: no rashes or lesions noted Neuro: no focal deficits Psych: appropriate mood and affect  Labs and Imaging: CBC BMET   Recent Labs Lab 11/17/16 1354  WBC 6.9  HGB 12.7  HCT 39.2  PLT 312    Recent Labs Lab 11/17/16 1354  NA 138  K 3.8  CL 103  CO2 26  BUN 8  CREATININE 0.71  GLUCOSE 85  CALCIUM 9.4    Troponin 0.01  Dg Chest 2 View  Result Date: 11/17/2016 CLINICAL DATA:  Pt in with c/o sharp central cp that began today at work, while at rest. Pt states feeling "hot and nauseous, thought it was maybe a hotflash" around 1230 today. Pt states she felt her heart "skipping beats" as well. Denies radia.*comment was truncated* EXAM: CHEST  2 VIEW COMPARISON:  None. FINDINGS: Normal mediastinum and cardiac silhouette. Normal pulmonary vasculature. No evidence of effusion, infiltrate, or pneumothorax. No acute bony abnormality. Anterior cervical fusion. IMPRESSION: No acute cardiopulmonary process. Electronically Signed   By: Genevive BiStewart  Edmunds M.D.   On: 11/17/2016 15:13   Tillman SersAngela C Riccio,  DO 11/17/2016, 3:48 PM PGY-1, [facility] FPTS Intern pager: [phone], text pages welcome  Upper Level Addendum:  I have seen and evaluated this patient along with Dr. Wonda Oldsiccio and reviewed the above note, making necessary revisions in blue.   Dr. Garry Heateraleigh Rumley, DO, PGY3 11/17/2017; 4:51 PM

## 2016-11-17 NOTE — ED Notes (Signed)
CBG 94 reported to Julaine FusiStacy Garner RN

## 2016-11-17 NOTE — ED Provider Notes (Signed)
[facility]-EMERGENCY DEPT Provider Note   CSN: [id number] Arrival date & time: 11/17/16  1336     History   Chief Complaint Chief Complaint  Patient presents with  . Chest Pain    HPI Kristie Martinez is a 55 y.o. female with a PMHx of varicose veins, who presents to the ED with complaints of sudden onset chest pain began around 12:30 PM while she was sitting in her office. Patient states that when she arrived to work today she felt that her room was hot, and was having some hot flashes, but around 12:30 she began having intense chest pain and felt diaphoretic and nauseated. She describes her chest pain is 8/10 constant sharp central pain radiating into the upper back, with no known aggravating factors, unchanged with inspiration or movement, and unrelieved with Tums. Additional associated symptoms included palpitations. She states that currently her symptoms have completely resolved and the only thing she continues to have some acid reflux/indigestion. She did not take anything else aside from Tums. She has a nonsmoker. She has been taking NSAIDs for her right leg, had a procedure done where they injected a vein in her right leg in order to help with her varicose veins. She wears TED hose daily, has not noticed any acute leg swelling today or recently. +FHx of MI in her brother at 45y/o (survived, had CABG), mother at 83y/o (died), and father at 64y/o (died). Also with +FHx of DM, HTN, HLD, but denies personal hx of these. Reports that she had chest pain in 2009 that she saw Dr. Danelle BerryMcGuken? (cardiology) for, had an exercise stress test that she recalls was normal; reports her EKG was unremarkable at that time. She works as a Engineer, civil (consulting)nurse here, and teaches EKG reading.  She denies recent fevers, chills, ongoing CP, SOB, cough, lightheadedness, recent travel/surgery/immobilization, personal/family hx of DVT/PE, estrogen use, abd pain, V/D/C, hematuria, dysuria, myalgias, arthralgias, numbness, tingling, weakness,  orthopnea, claudication, or any other symptoms/complaints at this time. [place] in [place], Dr. Vernona RiegerKatherine Clark.   The history is provided by the patient and medical records. No language interpreter was used.  Chest Pain   This is a new problem. The current episode started 1 to 2 hours ago. The problem occurs constantly. The problem has been resolved. The pain is associated with rest. The pain is present in the substernal region. The pain is at a severity of 8/10. The pain is moderate. The quality of the pain is described as sharp. The pain radiates to the upper back. Duration of episode(s) is 1 hour. Exacerbated by: nothing. Associated symptoms include diaphoresis, nausea and palpitations. Pertinent negatives include no abdominal pain, no claudication, no cough, no fever, no lower extremity edema, no numbness, no orthopnea, no shortness of breath, no vomiting and no weakness. She has tried antacids for the symptoms. The treatment provided no relief.  Pertinent negatives for past medical history include no CAD, no diabetes, no DVT, no hyperlipidemia, no hypertension and no PE.  Her family medical history is significant for CAD, diabetes, hyperlipidemia, hypertension and early MI.  Pertinent negatives for family medical history include: no PE.  Procedure history is positive for exercise treadmill test (in 2009, normal per her reports).    Past Medical History:  Diagnosis Date  . Varicose veins     Patient Active Problem List   Diagnosis Date Noted  . Varicose veins of lower extremities with other complications 05/17/2012    Past Surgical History:  Procedure Laterality Date  .  CERVICAL DISC ARTHROPLASTY    . ENDOVENOUS ABLATION SAPHENOUS VEIN W/ LASER  09-27-2012   left greater saphenous vein  by Gretta Beganodd Early MD  . ENDOVENOUS ABLATION SAPHENOUS VEIN W/ LASER  11-08-2012   right greater saphenous vein   by Gretta Beganodd Early MD  . mini gastric  2004    OB History    No data available        Home Medications    Prior to Admission medications   Medication Sig Start Date End Date Taking? Authorizing Provider  amoxicillin-clavulanate (AUGMENTIN) 875-125 MG tablet Take 1 tablet by mouth 2 (two) times daily. 01/14/16   Waldon MerlWilliam C Martin, PA-C  calcium carbonate (TUMS - DOSED IN MG ELEMENTAL CALCIUM) 500 MG chewable tablet Chew 1 tablet by mouth daily.    Historical Provider, MD  cyclobenzaprine (FLEXERIL) 5 MG tablet Take 1 tablet (5 mg total) by mouth 3 (three) times daily as needed for muscle spasms. 01/18/13   Linna HoffJames D Kindl, MD  meloxicam (MOBIC) 7.5 MG tablet Take 7.5 mg by mouth daily.    Historical Provider, MD  methylcellulose oral powder Take by mouth.    Historical Provider, MD  methylPREDNISolone (MEDROL DOSEPAK) 4 MG tablet follow package directions, start today and take until finished. 01/18/13   Linna HoffJames D Kindl, MD  promethazine (PHENERGAN) 12.5 MG tablet Take 1-2 tablets (12.5-25 mg total) by mouth every 8 (eight) hours as needed for nausea or vomiting. 01/19/16   Beau FannyJohn C Withrow, FNP    Family History No family history on file.  Social History Social History  Substance Use Topics  . Smoking status: Never Smoker  . Smokeless tobacco: Never Used  . Alcohol use Yes     Allergies   Patient has no known allergies.   Review of Systems Review of Systems  Constitutional: Positive for diaphoresis. Negative for chills and fever.  Respiratory: Negative for cough and shortness of breath.   Cardiovascular: Positive for chest pain and palpitations. Negative for orthopnea, claudication and leg swelling.  Gastrointestinal: Positive for nausea. Negative for abdominal pain, constipation, diarrhea and vomiting.  Genitourinary: Negative for dysuria and hematuria.  Musculoskeletal: Negative for arthralgias and myalgias.  Skin: Negative for color change.  Allergic/Immunologic: Negative for immunocompromised state.  Neurological: Negative for weakness, light-headedness and  numbness.  Psychiatric/Behavioral: Negative for confusion.   10 Systems reviewed and are negative for acute change except as noted in the HPI.   Physical Exam Updated Vital Signs BP 131/96   Pulse 81   Temp 97.6 F (36.4 C) (Oral)   Resp 15   Ht 5\' 6"  (1.676 m)   Wt 88 kg   SpO2 98%   BMI 31.31 kg/m   Physical Exam  Constitutional: She is oriented to person, place, and time. Vital signs are normal. She appears well-developed and well-nourished.  Non-toxic appearance. No distress.  Afebrile, nontoxic, NAD although mildly anxious  HENT:  Head: Normocephalic and atraumatic.  Mouth/Throat: Oropharynx is clear and moist and mucous membranes are normal.  Eyes: Conjunctivae and EOM are normal. Right eye exhibits no discharge. Left eye exhibits no discharge.  Neck: Normal range of motion. Neck supple.  Cardiovascular: Normal rate, regular rhythm, normal heart sounds and intact distal pulses.  Exam reveals no gallop and no friction rub.   No murmur heard. RRR, nl s1/s2, no m/r/g, distal pulses intact, no pitting pedal edema although TED hose in place  Pulmonary/Chest: Effort normal and breath sounds normal. No respiratory distress. She has no decreased  breath sounds. She has no wheezes. She has no rhonchi. She has no rales.  CTAB in all lung fields, no w/r/r, no hypoxia or increased WOB, speaking in full sentences, SpO2 98% on RA   Abdominal: Soft. Normal appearance and bowel sounds are normal. She exhibits no distension. There is no tenderness. There is no rigidity, no rebound, no guarding, no CVA tenderness, no tenderness at McBurney's point and negative Murphy's sign.  Soft, NTND, +BS throughout, no r/g/r, neg murphy's, neg mcburney's, no CVA TTP   Musculoskeletal: Normal range of motion.  MAE x4 Strength and sensation grossly intact in all extremities Distal pulses intact Gait steady No pitting pedal edema although TED hose in place  Neurological: She is alert and oriented to  person, place, and time. She has normal strength. No sensory deficit.  Skin: Skin is warm, dry and intact. No rash noted.  Psychiatric: Her mood appears anxious (slightly).  Mildly anxious  Nursing note and vitals reviewed.    ED Treatments / Results  Labs (all labs ordered are listed, but only abnormal results are displayed) Labs Reviewed  CBC  BASIC METABOLIC PANEL  CBG MONITORING, ED  Rosezena Sensor, ED    EKG  EKG Interpretation  Date/Time:  Thursday November 17 2016 13:43:16 EST Ventricular Rate:  92 PR Interval:    QRS Duration: 85 QT Interval:  384 QTC Calculation: 475 R Axis:   -23 Text Interpretation:  Sinus rhythm Borderline left axis deviation Low voltage, precordial leads Probable anteroseptal infarct, old Confirmed by Fayrene Fearing  MD, MARK ([id number]) on 11/17/2016 2:02:11 PM       Radiology No results found.  Procedures Procedures (including critical care time)  Medications Ordered in ED Medications  aspirin chewable tablet 324 mg (not administered)  gi cocktail (Maalox,Lidocaine,Donnatal) (not administered)     Initial Impression / Assessment and Plan / ED Course  I have reviewed the triage vital signs and the nursing notes.  Pertinent labs & imaging results that were available during my care of the patient were reviewed by me and considered in my medical decision making (see chart for details).  Clinical Course     55 y.o. female here with sudden onset CP 2hrs prior to exam, with diaphoresis and nausea as well as palpitations. Works as a Engineer, civil (consulting) here. +FHx of cardiac disease, but no personal hx. On exam, mildly anxious but otherwise benign; no pitting LE swelling but TED hose in place; no tachycardia or hypoxia. Doubt PE, dissection. Labs unremarkable, initial trop neg. EKG with anteroseptal Q waves; pt actually remembers what her EKG looked like in 2009 when she had an episode of CP (had stress test which was neg; seen by Dr. Danelle Berry?) and remembers that  she didn't have that on her EKG at that time. Given family risk factors, concerning story, EKG changes, HEART score moderate (~3-4), will proceed with admission for ACS r/o. ASA and GI cocktail given; pt pain free upon exam, so no other meds ordered. Will proceed with admission. Discussed case with my attending Dr. Fayrene Fearing who agrees with plan. Of note, no CXR ordered before exam; will obtain now.  2:44 PM Dr. Wonda [facility] returning page and will admit. CXR not yet done, see their notes for results of this study. Holding orders placed. Please see their notes for further documentation of care. I appreciate their help with this pleasant pt's care. Pt stable at time of admission.   Final Clinical Impressions(s) / ED Diagnoses   Final  diagnoses:  Atypical chest pain  Nausea  Abnormal EKG    New Prescriptions New Prescriptions   No medications on file     Allen Derry, PA-C 11/17/16 1454    Rolland Porter, MD 11/25/16 [phone]

## 2016-11-17 NOTE — ED Triage Notes (Signed)
Pt in with c/o sharp central cp that began today at work, while at rest. Pt states feeling "hot and nauseous, thought it was maybe a hotflash" around 1230 today. Pt states she felt her heart "skipping beats" as well. Denies radiation to any extremities, denies sob. Endorses family hx of cardiac disease

## 2016-11-18 ENCOUNTER — Encounter (HOSPITAL_COMMUNITY): Payer: Self-pay | Admitting: Nurse Practitioner

## 2016-11-18 ENCOUNTER — Other Ambulatory Visit: Payer: Self-pay | Admitting: Nurse Practitioner

## 2016-11-18 DIAGNOSIS — I4891 Unspecified atrial fibrillation: Secondary | ICD-10-CM | POA: Diagnosis not present

## 2016-11-18 DIAGNOSIS — R002 Palpitations: Secondary | ICD-10-CM | POA: Diagnosis not present

## 2016-11-18 DIAGNOSIS — R072 Precordial pain: Secondary | ICD-10-CM

## 2016-11-18 DIAGNOSIS — K219 Gastro-esophageal reflux disease without esophagitis: Secondary | ICD-10-CM | POA: Diagnosis not present

## 2016-11-18 DIAGNOSIS — Z8249 Family history of ischemic heart disease and other diseases of the circulatory system: Secondary | ICD-10-CM | POA: Diagnosis not present

## 2016-11-18 DIAGNOSIS — R0789 Other chest pain: Secondary | ICD-10-CM | POA: Diagnosis not present

## 2016-11-18 DIAGNOSIS — Z7982 Long term (current) use of aspirin: Secondary | ICD-10-CM | POA: Diagnosis not present

## 2016-11-18 LAB — BASIC METABOLIC PANEL
ANION GAP: 7 (ref 5–15)
BUN: 9 mg/dL (ref 6–20)
CALCIUM: 9 mg/dL (ref 8.9–10.3)
CO2: 29 mmol/L (ref 22–32)
Chloride: 103 mmol/L (ref 101–111)
Creatinine, Ser: 0.69 mg/dL (ref 0.44–1.00)
GFR calc non Af Amer: >60 mL/min (ref >60)
GLUCOSE: 108 mg/dL — AB (ref 65–99)
Potassium: 4.3 mmol/L (ref 3.5–5.1)
Sodium: 139 mmol/L (ref 135–145)

## 2016-11-18 LAB — CBC
HEMATOCRIT: 38.3 % (ref 36.0–46.0)
HEMOGLOBIN: 12 g/dL (ref 12.0–15.0)
MCH: 27.6 pg (ref 26.0–34.0)
MCHC: 31.3 g/dL (ref 30.0–36.0)
MCV: 88 fL (ref 78.0–100.0)
Platelets: 262 10*3/uL (ref 150–400)
RBC: 4.35 MIL/uL (ref 3.87–5.11)
RDW: 13.1 % (ref 11.5–15.5)
WBC: 5.8 10*3/uL (ref 4.0–10.5)

## 2016-11-18 NOTE — Consult Note (Signed)
Cardiology Consult    Patient ID: Kristie Martinez MRN: [id number], DOB/AGE: 08/04/1961   Admit date: 11/17/2016 Date of Consult: 11/18/2016  Primary Physician: Vernona Rieger, MD Primary Cardiologist: New - H. Katrinka Blazing, MD  Requesting Provider: Karie Schwalbe McDiarmid, MD  Patient Profile    55 y/o ? w/o prior cardiac hx who was admitted 12/28 with sharp chest pain.  Past Medical History   Past Medical History:  Diagnosis Date  . Indigestion   . Varicose veins     Past Surgical History:  Procedure Laterality Date  . CERVICAL DISC ARTHROPLASTY    . ENDOMETRIAL ABLATION    . ENDOVENOUS ABLATION SAPHENOUS VEIN W/ LASER  09-27-2012   left greater saphenous vein  by Gretta Began MD  . ENDOVENOUS ABLATION SAPHENOUS VEIN W/ LASER  11-08-2012   right greater saphenous vein   by Gretta Began MD  . mini gastric  2004     Allergies  No Known Allergies  History of Present Illness    55 y/o ? w/o prior cardiac hx.  She does have a FH of CAD with her father dying of a MI @ age 5 and her brother requiring CABG x 3 @ age 12.  She lives in [place] with her husband and is generally active w/o limitations.  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  She has recently noted occasional left arm discomfort, which she attributes to having to carry her 14 lb dog more often, since it injured its knee and is going to require surgery.  On 12/28, she came into work in the ED and while in her office, felt warm/hot.  This persisted throughout the morning and she called maintenance.  When maintenance arrived sometime around noon, she started to experience focal, sharp, left sided chest pain with nausea and diaphoresis.  She also noted palpitations with 'skipped beats.'  A fellow ED clinical nurse specialist checked her pulse and recommended that she be seen in the ED.  In the ED, ECG showed delayed R progression but no acute ST/T changes.  Trop was nl.  She was  admitted for further eval.  Total duration of sharp c/p was about 30 mins.  She has had no recurrence of that but has noted occasional brief twinges of c/p, lasting a few secs and resolving spontaneously, which she attributes to cervical neck dzs and discomfort.  Inpatient Medications    . aspirin EC  325 mg Oral Daily  . enoxaparin (LOVENOX) injection  40 mg Subcutaneous Q24H  . pantoprazole  40 mg Oral BID    Family History    Family History  Problem Relation Age of Onset  . CAD Mother   . Heart attack Mother     died @ 22 of MI  . Heart attack Father     died @ 15 of MI  . CAD Brother     s/p CABG x 3 @ age 11    Social History    Social History   Social History  . Marital status: Married    Spouse name: N/A  . Number of children: N/A  . Years of education: N/A   Occupational History  . Clinical Nurse Specialist for [employer]    Social History Main Topics  . Smoking status: Never Smoker  . Smokeless tobacco: Never Used  . Alcohol use Yes     Comment: occasional glass of wine.  . Drug use:  No  . Sexual activity: Not on file   Other Topics Concern  . Not on file   Social History Narrative   Lives in [place] with husband.  Works @ [employer].  Finished PhD in 2016.  Exercises regularly.     Review of Systems    General:  Felt warm on day of admission w/ diaphoresis. No chills, fever, night sweats or weight changes.  Cardiovascular:  +++ chest pain, no dyspnea on exertion, edema, orthopnea, palpitations, paroxysmal nocturnal dyspnea. Dermatological: No rash, lesions/masses Respiratory: No cough, dyspnea Urologic: No hematuria, dysuria Abdominal:   +++ nausea on day of admission.  No vomiting, diarrhea, bright red blood per rectum, melena, or hematemesis Neurologic:  No visual changes, wkns, changes in mental status. All other systems reviewed and are otherwise negative except as noted above.  Physical Exam    Blood pressure 107/72, pulse 77, temperature 97.5 F  (36.4 C), temperature source Oral, resp. rate 18, height 5\' 6"  (1.676 m), weight 194 lb (88 kg), SpO2 99 %.  General: Pleasant, NAD Psych: Normal affect. Neuro: Alert and oriented X 3. Moves all extremities spontaneously. HEENT: Normal  Neck: Supple without bruits or JVD. Lungs:  Resp regular and unlabored, CTA. Heart: RRR no s3, s4, or murmurs. Abdomen: Soft, non-tender, non-distended, BS + x 4.  Extremities: No clubbing, cyanosis or edema. DP/PT/Radials 2+ and equal bilaterally.  Labs    Troponin [facility](Point of Care Test)  Recent Labs  11/17/16 1356  TROPIPOC 0.01    Recent Labs  11/17/16 1603 11/17/16 1854 11/17/16 2142  TROPONINI <0.03 <0.03 <0.03   Lab Results  Component Value Date   WBC 5.8 11/18/2016   HGB 12.0 11/18/2016   HCT 38.3 11/18/2016   MCV 88.0 11/18/2016   PLT 262 11/18/2016    Recent Labs Lab 11/18/16 0720  NA 139  K 4.3  CL 103  CO2 29  BUN 9  CREATININE 0.69  CALCIUM 9.0  GLUCOSE 108*   Radiology Studies    Dg Chest 2 View  Result Date: 11/17/2016 CLINICAL DATA:  Pt in with c/o sharp central cp that began today at work, while at rest. Pt states feeling "hot and nauseous, thought it was maybe a hotflash" around 1230 today. Pt states she felt her heart "skipping beats" as well. Denies radia.*comment was truncated* EXAM: CHEST  2 VIEW COMPARISON:  None. FINDINGS: Normal mediastinum and cardiac silhouette. Normal pulmonary vasculature. No evidence of effusion, infiltrate, or pneumothorax. No acute bony abnormality. Anterior cervical fusion. IMPRESSION: No acute cardiopulmonary process. Electronically Signed   By: Genevive BiStewart  Edmunds M.D.   On: 11/17/2016 15:13    ECG & Cardiac Imaging    RSR, 92, leftward axis, delayed R progression.  Assessment & Plan    1.  Left chest pain: Pt developed focal, sharp, left sided chest pain associated with diaphoresis and nausea after feeling unusually warm all morning on 12/28.  C/P lasted ~ 30 mins and  resolved spontaneously.  ECG non-acute.  Trop nl.  She has had some fleeting chest discomfort since but no persistent c/p.  Recommend ambulation around unit.  Provided that she is asymptomatic, in the absence of objective evidence of ischemia, she may be discharged home today and I will arrange for an outpatient exercise myoview as her FH is concerning.  Cont aspirin therapy.  2.  Palpitations:  Pt did have some "skipped beats" @ the time of symptoms yesterday.  Occasional PAC's noted on tele.  Further review of tele  shows AFib occurring between 12:15 pm and 12:38 pm on 12/28, however pt was not on 2W @ that point and was not in ED until ~ 12:40 (first ECG 12:43). I have spoken to the 2W charge nurse and central tele was contacted.  ER info does not transfer to the floors, thus tele revealing Afib was not from this patient.  Signed, Nicolasa Duckinghristopher Berge, NP 11/18/2016, 1:04 PM  The patient has been seen in conjunction with Nicolasa Duckinghristopher Berge, NP. All aspects of care have been considered and discussed. The patient has been personally interviewed, examined, and all clinical data has been reviewed.   The presenting symptom complex is atypical for unstable angina/acute coronary syndrome. Cardiac markers and EKGs have been normal.  Have recommended that the patient ambulate vigorously and if no recurrent symptoms, she be discharged for outpatient ischemic workup.

## 2016-11-18 NOTE — Discharge Summary (Signed)
Family Medicine Teaching [facility] Discharge Summary  Patient name: Kristie Martinez Medical record number: [id number] Date of birth: 04-12-1961 Age: 55 y.o. Gender: female Date of Admission: 11/17/2016  Date of Discharge: 11/18/16 Admitting Physician: Leighton Roachodd D McDiarmid, MD  Primary Care Provider: Vernona RiegerLARK,KATHERINE, MD Consultants: cardiology  Indication for Hospitalization: chest pain  Discharge Diagnoses/Problem List:  Chest pain  Disposition: home  Discharge Condition: stable  Discharge Exam: see progress note from day of discharge  Brief Hospital Course:   Kristie Martinez is a 55 year old with no significant PMH who [facility] with chest pain. [facility] and ACS rule out. Chest pain was atypical. Troponin negative x 4. EKG with changes from prior studies and demonstrated age indeterminate anteroseptal q waves. Patient also endorsed symptoms of reflux and was given GI cocktail as well as protonix while in hospital. She was evaluated by cardiology who felt she was stable for discharge with outpatient ischemic work up. She was agreeable to this plan and was discharged home on 12/30 with close cardiology follow up.   Issues for Follow Up:  1. Follow up with cardiology outpatient for ischemic workup 2. Continue 2 weeks of high dose PPI  3. Follow up with PCP regularly  Significant Procedures: none  Significant Labs and Imaging:   Recent Labs Lab 11/17/16 1354 11/18/16 0720  WBC 6.9 5.8  HGB 12.7 12.0  HCT 39.2 38.3  PLT 312 262    Recent Labs Lab 11/17/16 1354 11/18/16 0720  NA 138 139  K 3.8 4.3  CL 103 103  CO2 26 29  GLUCOSE 85 108*  BUN 8 9  CREATININE 0.71 0.69  CALCIUM 9.4 9.0    Troponin <0.03 x 3  Dg Chest 2 View  Result Date: 11/17/2016 CLINICAL DATA:  Pt in with c/o sharp central cp that began today at work, while at rest. Pt states feeling "hot and nauseous, thought it was maybe a hotflash"  around 1230 today. Pt states she felt her heart "skipping beats" as well. Denies radia.*comment was truncated* EXAM: CHEST  2 VIEW COMPARISON:  None. FINDINGS: Normal mediastinum and cardiac silhouette. Normal pulmonary vasculature. No evidence of effusion, infiltrate, or pneumothorax. No acute bony abnormality. Anterior cervical fusion. IMPRESSION: No acute cardiopulmonary process. Electronically Signed   By: Genevive BiStewart  Edmunds M.D.   On: 11/17/2016 15:13     Results/Tests Pending at Time of Discharge: none  Discharge Medications:  Allergies as of 11/18/2016   No Known Allergies     Medication List    TAKE these medications   calcium carbonate 500 MG chewable tablet Commonly known as:  TUMS - dosed in mg elemental calcium Chew 1 tablet by mouth daily.   cholecalciferol 1000 units tablet Commonly known as:  VITAMIN D Take 1,000 Units by mouth daily.   meloxicam 7.5 MG tablet Commonly known as:  MOBIC Take 7.5 mg by mouth daily as needed for pain. For neck pain   methylcellulose oral powder Take 1 packet by mouth daily.   multivitamin with minerals Tabs tablet Take 1 tablet by mouth daily.   VITAMIN B-12 PO Take 1 tablet by mouth daily.       Discharge Instructions: Please refer to Patient Instructions section of EMR for full details.  Patient was counseled important signs and symptoms that should prompt return to medical care, changes in medications, dietary instructions, activity restrictions, and follow up appointments.   Follow-Up Appointments: Follow-up  Information    [facility] Follow up on 11/23/2016.   Why:  7:15 AM - Exercise Stress Test - Nothing to eat after midnight.  Wear comfortable shoes/sneakers/clothing. Contact information: [address] [place] [address] [phone]       Cline CrockKathryn Thompson, PA-C Follow up on 11/30/2016.   Specialties:  Cardiology, Radiology Why:  10:30am -  pls arrive @ 10:15am Contact information: [address] [place] [address] [phone]           Tillman SersAngela C Lauraine Crespo, DO 11/19/2016, 8:10 AM PGY-1, [facility]

## 2016-11-18 NOTE — Progress Notes (Signed)
Order received to discharge patient.  Patient expresses readiness to discharge.  Discharge instructions, follow up, medications and instructions for their use were discussed with patient and patient expressed understanding.  Telemetry monitor removed and CCMD notified.  PIV access removed without complication.

## 2016-11-18 NOTE — Discharge Instructions (Signed)
You were admitted to the hospital for chest pain.  Your blood labs were not significant but your EKG did show some changes and so cardiology was consulted to evaluate you.  They have cleared you for discharge and would like to perform a stress test on January 3rd (see follow up appointments).  Please keep your future appointments and also follow up with your PCP.  Thank you for allowing us to participate in your care and we are glad you are feeling better!

## 2016-11-18 NOTE — Progress Notes (Signed)
Patient ambulated in the hall for 10 minutes without any difficulty.

## 2016-11-18 NOTE — Progress Notes (Signed)
Family Medicine Teaching Service Daily Progress Note Intern Pager: [phone]  Patient name: Kristie RouteDenise E Kilker Medical record number: [id number] Date of birth: June 12, 1961 Age: 55 y.o. Gender: female  Primary Care Provider: Vernona RiegerLARK,KATHERINE, MD Consultants: cardiology Code Status: full  Pt Overview and Major Events to Date:  12/28- observation for ACS rule out  Assessment and Plan: Kristie Martinez is a 55 y.o. female presenting with chest pain . PMH is significant for varicose veins.  Chest Pain- had an episode of chest pain accompanied by diaphoresis, feeling hot, radiated to back while at rest. Also endorsed palpitations at this time. She has noted intermittent left arm pain throughout today. Resolved after 20 minutes. Patient also endorsing burning feeling in throat. Heart Score 4. CXR with no acute cardiopulmonary process. Possibly cardiac related but may also have GI etiology vs related to her past cervical surgery. Troponin 0.01 in ED. EKG with changes from prior study in 2009 with new Q waves in anteroseptal leads. No previous EKGs documented in Epic, but patient reports she had normal EKG in 2009. Also reports Stress Test in 2009, which was normal.  - Vitals per floor - Continuous cardiac monitoring - EKG this morning with age indeterminate septal infarct - Troponin negative x 3 - am CBC, BMP wnl - cardiology consulted due to patient's strong FH, appreciate recommendations - continue PPI - GI cocktail prn - Aspirin 325 daily. Anticipate transitioning to 81mg  dosing at discharge.  FEN/GI: regular diet, PPI Prophylaxis: lovenox  Disposition: pending clinical improvement  Subjective:  Ms. Raina MinaRhew is doing well this morning. She had hot flashes overnight and felt like she was having reflux. GI cocktail did not provide much relief. Had some "twinges" of chest pain on left side that were brief and nothing like what she experienced yesterday. No shortness of breath or diaphoresis.    Objective: Temp:  [97.5 F (36.4 C)-98 F (36.7 C)] 97.5 F (36.4 C) (12/29 0600) Pulse Rate:  [66-87] 77 (12/29 0600) Resp:  [12-22] 18 (12/29 0600) BP: (107-148)/(56-99) 107/72 (12/29 0600) SpO2:  [97 %-100 %] 99 % (12/29 0600) Weight:  [194 lb (88 kg)] 194 lb (88 kg) (12/28 1401) Physical Exam: General: sitting up in bed in NAD Cardiovascular: RRR no MRG Respiratory: CTA bilaterally. No increased work of breathing Abdomen: soft, nontender, nondistended +BS Extremities: no edema, moves all extremities normally  Laboratory:  Recent Labs Lab 11/17/16 1354 11/18/16 0720  WBC 6.9 5.8  HGB 12.7 12.0  HCT 39.2 38.3  PLT 312 262    Recent Labs Lab 11/17/16 1354  NA 138  K 3.8  CL 103  CO2 26  BUN 8  CREATININE 0.71  CALCIUM 9.4  GLUCOSE 85   Troponin <0.03 x 3  Imaging/Diagnostic Tests: Dg Chest 2 View  Result Date: 11/17/2016 CLINICAL DATA:  Pt in with c/o sharp central cp that began today at work, while at rest. Pt states feeling "hot and nauseous, thought it was maybe a hotflash" around 1230 today. Pt states she felt her heart "skipping beats" as well. Denies radia.*comment was truncated* EXAM: CHEST  2 VIEW COMPARISON:  None. FINDINGS: Normal mediastinum and cardiac silhouette. Normal pulmonary vasculature. No evidence of effusion, infiltrate, or pneumothorax. No acute bony abnormality. Anterior cervical fusion. IMPRESSION: No acute cardiopulmonary process. Electronically Signed   By: Genevive BiStewart  Edmunds M.D.   On: 11/17/2016 15:13     Tillman Sersngela C Elizer Bostic, DO 11/18/2016, 7:47 AM PGY-1,  Family Medicine [facility] Intern pager: [phone], text pages welcome

## 2016-11-22 ENCOUNTER — Telehealth (HOSPITAL_COMMUNITY): Payer: Self-pay | Admitting: *Deleted

## 2016-11-22 NOTE — Telephone Encounter (Signed)
Patient given detailed instructions per Myocardial Perfusion Study Information Sheet for the test on 11/23/16 at 0715. Patient notified to arrive 15 minutes early and that it is imperative to arrive on time for appointment to keep from having the test rescheduled.  If you need to cancel or reschedule your appointment, please call the office within 24 hours of your appointment. Failure to do so may result in a cancellation of your appointment, and a $50 no show fee. Patient verbalized understanding.Kristie Martinez W    

## 2016-11-23 ENCOUNTER — Ambulatory Visit (HOSPITAL_COMMUNITY): Payer: 59 | Attending: Cardiovascular Disease

## 2016-11-23 DIAGNOSIS — R072 Precordial pain: Secondary | ICD-10-CM

## 2016-11-23 DIAGNOSIS — R079 Chest pain, unspecified: Secondary | ICD-10-CM | POA: Insufficient documentation

## 2016-11-23 DIAGNOSIS — Z8249 Family history of ischemic heart disease and other diseases of the circulatory system: Secondary | ICD-10-CM | POA: Diagnosis not present

## 2016-11-23 LAB — MYOCARDIAL PERFUSION IMAGING
CHL CUP NUCLEAR SRS: 0
CHL CUP NUCLEAR SSS: 0
CSEPEDS: 0 s
CSEPEW: 8.5 METS
CSEPHR: 94 %
CSEPPHR: 157 {beats}/min
Exercise duration (min): 7 min
LV dias vol: 70 mL (ref 46–106)
LV sys vol: 26 mL
MPHR: 167 {beats}/min
NUC STRESS TID: 0.65
RATE: 0.25
Rest HR: 75 {beats}/min
SDS: 0

## 2016-11-23 MED ORDER — TECHNETIUM TC 99M TETROFOSMIN IV KIT
10.1000 | PACK | Freq: Once | INTRAVENOUS | Status: AC | PRN
  Administered 2016-11-23: 10.1 via INTRAVENOUS
  Filled 2016-11-23: qty 11

## 2016-11-23 MED ORDER — TECHNETIUM TC 99M TETROFOSMIN IV KIT
31.9000 | PACK | Freq: Once | INTRAVENOUS | Status: AC | PRN
  Administered 2016-11-23: 31.9 via INTRAVENOUS
  Filled 2016-11-23: qty 32

## 2016-11-29 NOTE — Progress Notes (Signed)
Cardiology Office Note    Date:  11/30/2016   ID:  Kristie RouteDenise E Swavely, DOB 12-20-1960, MRN [id number]  PCP:  Vernona RiegerLARK,KATHERINE, MD  Cardiologist:  Dr. Katrinka BlazingSmith   CC: post hospital follow up   History of Present Illness:  Kristie Martinez is a 56 y.o. female with no past medical history but a family history of early CAD who presents to clinic for post hospital follow up.   She does have a FH of CAD with her father dying of a MI @ age 54 and her brother requiring CABG x 3 @ age 54. She was seen in ER on 11/18/16 for evaluation of chest pain. She ruled out for MI and sx felt to be atypical. She was seen by Dr. Katrinka BlazingSmith who recommended discharge with outpatient nuclear stress test. This was completed on 11/24/15 and low risk.    Today she presents to clinic for follow up. She still feels like she is having a small stabbing sensation in her chest area. She thinks this could be related to her cervical neck disease. No symptoms with exertion when exercising or when out dancing. No SOB. Has some mild LE edema from venous sclerotherapy. No orthopnea or PND. No dizziness or syncope. She does get a lot of indigestion and night and sleeps on an incline for neck pain.     Past Medical History:  Diagnosis Date  . Indigestion   . Varicose veins     Past Surgical History:  Procedure Laterality Date  . CERVICAL DISC ARTHROPLASTY    . ENDOMETRIAL ABLATION    . ENDOVENOUS ABLATION SAPHENOUS VEIN W/ LASER  09-27-2012   left greater saphenous vein  by Gretta Beganodd Early MD  . ENDOVENOUS ABLATION SAPHENOUS VEIN W/ LASER  11-08-2012   right greater saphenous vein   by Gretta Beganodd Early MD  . mini gastric  2004    Current Medications: Outpatient Medications Prior to Visit  Medication Sig Dispense Refill  . calcium carbonate (TUMS - DOSED IN MG ELEMENTAL CALCIUM) 500 MG chewable tablet Chew 1 tablet by mouth daily.    . cholecalciferol (VITAMIN D) 1000 units tablet Take 1,000 Units by mouth daily.    . Cyanocobalamin (VITAMIN  B-12 PO) Take 1 tablet by mouth daily.     . meloxicam (MOBIC) 7.5 MG tablet Take 7.5 mg by mouth daily as needed for pain. For neck pain    . methylcellulose oral powder Take 1 packet by mouth daily.     . Multiple Vitamin (MULTIVITAMIN WITH MINERALS) TABS tablet Take 1 tablet by mouth daily.     No facility-administered medications prior to visit.      Allergies:   Patient has no known allergies.   Social History   Social History  . Marital status: Married    Spouse name: N/A  . Number of children: N/A  . Years of education: N/A   Occupational History  . 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 Smoking status: Never Smoker  . Smokeless tobacco: Never Used  . Alcohol use Yes     Comment: occasional glass of wine.  . Drug use: No  . Sexual activity: Not Asked   Other Topics Concern  . None   Social History Narrative   [place] in [place] with [place].  Works @ [employer].  Finished PhD in 2016.  Exercises regularly.     Family History:  The patient'sfamily history includes CAD in her brother and  mother; Heart attack in her father and mother.      ROS:   Please see the history of present illness.    ROS All other systems reviewed and are negative.   PHYSICAL EXAM:   VS:  BP 106/80 (BP Location: Left Arm, Patient Position: Sitting, Cuff Size: Large)   Pulse 72   Ht 5\' 6"  (1.676 m)   Wt 198 lb (89.8 kg)   BMI 31.96 kg/m    GEN: Well nourished, well developed, in no acute distress  HEENT: normal  Neck: no JVD, carotid bruits, or masses Cardiac: RRR; no murmurs, rubs, or gallops,no edema  Respiratory:  clear to auscultation bilaterally, normal work of breathing GI: soft, nontender, nondistended, + BS MS: no deformity or atrophy  Skin: warm and dry, no rash Neuro:  Alert and Oriented x 3, Strength and sensation are intact Psych: euthymic mood, full affect    Wt Readings from Last 3 Encounters:  11/30/16 198 lb (89.8 kg)  11/17/16 194 lb  (88 kg)  11/12/12 184 lb (83.5 kg)      Studies/Labs Reviewed:   EKG:  EKG is NOT ordered today.    Recent Labs: 11/18/2016: BUN 9; Creatinine, Ser 0.69; Hemoglobin 12.0; Platelets 262; Potassium 4.3; Sodium 139   Lipid Panel No results found for: CHOL, TRIG, HDL, CHOLHDL, VLDL, LDLCALC, LDLDIRECT  Additional studies/ records that were reviewed today include:  Myoview 11/24/15 Study Highlights    Nuclear stress EF: 63%.  There was no ST segment deviation noted during stress.  Blood pressure demonstrated a normal response to exercise.  The study is normal.  The left ventricular ejection fraction is normal (55-65%).   Normal study, no evidence for ischemia or infarction.        ASSESSMENT & PLAN:   Chest pain: atypical and recent nuclear stress test low risk for ischemia. No further work up.   GERD: would like to try protonix. I will prescribed this    Medication Adjustments/Labs and Tests Ordered: Current medicines are reviewed at length with the patient today.  Concerns regarding medicines are outlined above.  Medication changes, Labs and Tests ordered today are listed in the Patient Instructions below. Patient Instructions  Medication Instructions:  Your physician has recommended you make the following change in your medication:  1.  START Protonix 40 mg taking 1 tablet daily   Labwork: None ordered   Testing/Procedures: None ordered  Follow-Up: Your physician recommends that you schedule a follow-up appointment in: AS NEEDED   Any Other Special Instructions Will Be Listed Below (If Applicable).     If you need a refill on your cardiac medications before your next appointment, please call your pharmacy.      Signed, Cline Crock, PA-C  11/30/2016 9:11 AM    [facility] [address] [place], [place], [place]  [address] Phone: [phone]; Fax: [phone]

## 2016-11-30 ENCOUNTER — Ambulatory Visit (INDEPENDENT_AMBULATORY_CARE_PROVIDER_SITE_OTHER): Payer: 59 | Admitting: Physician Assistant

## 2016-11-30 ENCOUNTER — Encounter: Payer: Self-pay | Admitting: Physician Assistant

## 2016-11-30 VITALS — BP 106/80 | HR 72 | Ht 66.0 in | Wt 198.0 lb

## 2016-11-30 DIAGNOSIS — K219 Gastro-esophageal reflux disease without esophagitis: Secondary | ICD-10-CM | POA: Diagnosis not present

## 2016-11-30 DIAGNOSIS — R079 Chest pain, unspecified: Secondary | ICD-10-CM

## 2016-11-30 MED ORDER — PANTOPRAZOLE SODIUM 40 MG PO TBEC: 40.0000 mg | DELAYED_RELEASE_TABLET | Freq: Every day | ORAL | 11 refills | Status: DC

## 2016-11-30 MED FILL — PANTOPRAZOLE SOD DR 40 MG T: 40 | 30 days supply | Qty: 30 | Fill #0

## 2016-11-30 NOTE — Patient Instructions (Signed)
Medication Instructions:  Your physician has recommended you make the following change in your medication:  1.  START Protonix 40 mg taking 1 tablet daily   Labwork: None ordered   Testing/Procedures: None ordered  Follow-Up: Your physician recommends that you schedule a follow-up appointment in: AS NEEDED   Any Other Special Instructions Will Be Listed Below (If Applicable).     If you need a refill on your cardiac medications before your next appointment, please call your pharmacy.

## 2016-12-19 MED FILL — PHENTERMINE 37.5 MG TABLET: 37.5 | 30 days supply | Qty: 30 | Fill #1

## 2017-01-03 MED FILL — PANTOPRAZOLE SOD DR 40 MG T: 40 | 90 days supply | Qty: 90 | Fill #1

## 2017-01-17 ENCOUNTER — Encounter: Payer: Self-pay | Admitting: *Deleted

## 2017-01-25 ENCOUNTER — Ambulatory Visit (INDEPENDENT_AMBULATORY_CARE_PROVIDER_SITE_OTHER): Payer: Self-pay | Admitting: *Deleted

## 2017-01-25 DIAGNOSIS — I83893 Varicose veins of bilateral lower extremities with other complications: Secondary | ICD-10-CM

## 2017-01-25 NOTE — Progress Notes (Signed)
X=.3% Sotradecol administered with a 27g butterfly.  Patient received a total of 6cc. Cleaned up any remaining spiders. She is pleased with the last tx results, Easy access. Tol well. Follow prn.    Compression stockings applied: Yes.

## 2017-01-31 ENCOUNTER — Encounter: Payer: Self-pay | Admitting: *Deleted

## 2017-02-24 MED FILL — PHENTERMINE 37.5 MG TABLET: 37.5 | 30 days supply | Qty: 30 | Fill #0 | Status: TO

## 2017-04-03 MED FILL — PANTOPRAZOLE SOD DR 40 MG T: 40 | 90 days supply | Qty: 90 | Fill #2

## 2017-04-05 MED FILL — PHENTERMINE 37.5 MG TABLET: 37.5 | 30 days supply | Qty: 30 | Fill #0

## 2017-05-11 ENCOUNTER — Emergency Department (HOSPITAL_COMMUNITY): Payer: PRIVATE HEALTH INSURANCE

## 2017-05-11 ENCOUNTER — Emergency Department (HOSPITAL_COMMUNITY)
Admission: EM | Admit: 2017-05-11 | Discharge: 2017-05-11 | Disposition: A | Discharge status: Home / Self Care | Payer: PRIVATE HEALTH INSURANCE | Attending: Emergency Medicine | Admitting: Emergency Medicine

## 2017-05-11 ENCOUNTER — Encounter (HOSPITAL_COMMUNITY): Payer: Self-pay | Admitting: Emergency Medicine

## 2017-05-11 DIAGNOSIS — S76391A Other specified injury of muscle, fascia and tendon of the posterior muscle group at thigh level, right thigh, initial encounter: Secondary | ICD-10-CM | POA: Insufficient documentation

## 2017-05-11 DIAGNOSIS — Y9359 Activity, other involving other sports and athletics played individually: Secondary | ICD-10-CM | POA: Insufficient documentation

## 2017-05-11 DIAGNOSIS — X509XXA Other and unspecified overexertion or strenuous movements or postures, initial encounter: Secondary | ICD-10-CM | POA: Diagnosis not present

## 2017-05-11 DIAGNOSIS — Y929 Unspecified place or not applicable: Secondary | ICD-10-CM | POA: Insufficient documentation

## 2017-05-11 DIAGNOSIS — Y999 Unspecified external cause status: Secondary | ICD-10-CM | POA: Diagnosis not present

## 2017-05-11 DIAGNOSIS — S79921A Unspecified injury of right thigh, initial encounter: Secondary | ICD-10-CM | POA: Diagnosis present

## 2017-05-11 DIAGNOSIS — S76301A Unspecified injury of muscle, fascia and tendon of the posterior muscle group at thigh level, right thigh, initial encounter: Secondary | ICD-10-CM

## 2017-05-11 MED ORDER — ONDANSETRON 4 MG PO TBDP
4.0000 mg | ORAL_TABLET | Freq: Once | ORAL | Status: AC
  Administered 2017-05-11: 4 mg via ORAL
  Filled 2017-05-11: qty 1

## 2017-05-11 MED ORDER — OXYCODONE HCL 5 MG PO TABS: 2.5000 mg | ORAL_TABLET | ORAL | 0 refills | Status: DC | PRN

## 2017-05-11 MED ORDER — MELOXICAM 15 MG PO TABS: 15.0000 mg | ORAL_TABLET | Freq: Every day | ORAL | 0 refills | Status: DC

## 2017-05-11 MED ORDER — OXYCODONE HCL 5 MG PO TABS
5.0000 mg | ORAL_TABLET | Freq: Once | ORAL | Status: AC
  Administered 2017-05-11: 5 mg via ORAL
  Filled 2017-05-11: qty 1

## 2017-05-11 MED ORDER — CYCLOBENZAPRINE HCL 10 MG PO TABS: 10.0000 mg | ORAL_TABLET | Freq: Two times a day (BID) | ORAL | 0 refills | Status: DC | PRN

## 2017-05-11 MED FILL — MELOXICAM 15 MG TABLET: 15 | 10 days supply | Qty: 10 | Fill #0

## 2017-05-11 MED FILL — oxyCODONE HCL 5 MG TABS: 5 | 3 days supply | Qty: 20 | Fill #0

## 2017-05-11 MED FILL — CYCLOBENZAPRINE 10 MG TAB: 10 | 10 days supply | Qty: 20 | Fill #0

## 2017-05-11 NOTE — Discharge Instructions (Signed)
Return for worsening pain or swelling in the calf and foot.

## 2017-05-11 NOTE — ED Notes (Addendum)
Given ginger ale. Paged ortho to apply knee immobilizer and crutches.

## 2017-05-11 NOTE — ED Triage Notes (Signed)
Pt was doing training for worker training. Pulled right hamstring during last maneuver. Saw MD at employee health. She was doing better but after some exercises, it popped again and she cannot sit down without extreme pain. Using cane.

## 2017-05-11 NOTE — ED Provider Notes (Signed)
[facility]-EMERGENCY DEPT Provider Note    By signing my name below, I, Earmon Phoenix, attest that this documentation has been prepared under the direction and in the presence of Arthor Captain, PA-C. Electronically Signed: Earmon Phoenix, ED Scribe. 05/11/17. 1:12 PM.    History   Chief Complaint Chief Complaint  Patient presents with  . Muscle Pain    The history is provided by the patient and medical records. No language interpreter was used.    Kristie Martinez is a 56 y.o. female who presents to the Emergency Department complaining of severe upper right posterior leg pain that began two days ago. Pt was doing a training course for self defense when the injury occurred causing a hamstring rupture. She was seen by a physician at [facility] yesterday and was advised to take her regularly prescribed Mobic. Her daughter who is a Multimedia programmer wrapped the leg for her. She describes the pain as burning. She has been taking the Mobic for pain with no significant relief. Movement and palpation increases the pain. She denies alleviating factors. She denies numbness, tingling or weakness of the RLE.   Past Medical History:  Diagnosis Date  . Indigestion   . Varicose veins     Patient Active Problem List   Diagnosis Date Noted  . Chest pain 11/17/2016  . Varicose veins of lower extremities with other complications 05/17/2012    Past Surgical History:  Procedure Laterality Date  . CERVICAL DISC ARTHROPLASTY    . ENDOMETRIAL ABLATION    . ENDOVENOUS ABLATION SAPHENOUS VEIN W/ LASER  09-27-2012   left greater saphenous vein  by Gretta Began MD  . ENDOVENOUS ABLATION SAPHENOUS VEIN W/ LASER  11-08-2012   right greater saphenous vein   by Gretta Began MD  . mini gastric  2004    OB History    No data available       Home Medications    Prior to Admission medications   Medication Sig Start Date End Date Taking? Authorizing Provider  calcium carbonate (TUMS - DOSED IN MG  ELEMENTAL CALCIUM) 500 MG chewable tablet Chew 1 tablet by mouth daily.    [provider]  cholecalciferol (VITAMIN D) 1000 units tablet Take 1,000 Units by mouth daily.    [provider]  Cyanocobalamin (VITAMIN B-12 PO) Take 1 tablet by mouth daily.     [provider]  Flaxseed, Linseed, ([employer] FLAXSEED OIL PO) Take by mouth daily.    [provider]  meloxicam (MOBIC) 7.5 MG tablet Take 7.5 mg by mouth daily as needed for pain. For neck pain    [provider]  methylcellulose oral powder Take 1 packet by mouth daily.     [provider]  Multiple Vitamin (MULTIVITAMIN WITH MINERALS) TABS tablet Take 1 tablet by mouth daily.    [provider]  pantoprazole (PROTONIX) 40 MG tablet Take 1 tablet (40 mg total) by mouth daily. 11/30/16   Janetta Hora, PA-C    Family History Family History  Problem Relation Age of Onset  . CAD Mother   . Heart attack Mother        died @ 72 of MI  . Heart attack Father        died @ 64 of MI  . CAD Brother        s/p CABG x 3 @ age 23    Social History Social History  Substance Use Topics  . Smoking status: Never Smoker  .  Smokeless tobacco: Never Used  . Alcohol use Yes     Comment: occasional glass of wine.     Allergies   Patient has no known allergies.   Review of Systems Review of Systems  Musculoskeletal: Positive for myalgias.  Neurological: Negative for weakness and numbness.     Physical Exam Updated Vital Signs BP 133/84 (BP Location: Left Arm)   Pulse 92   Temp 97.7 F (36.5 C) (Oral)   SpO2 100%   Physical Exam  Constitutional: She is oriented to person, place, and time. She appears well-developed and well-nourished.  HENT:  Head: Normocephalic and atraumatic.  Neck: Normal range of motion.  Cardiovascular: Normal rate.   Pulmonary/Chest: Effort normal.  Musculoskeletal: She exhibits edema and tenderness. She exhibits no deformity.  Exquisitely  TTP at ischial tuberosity of RLE. Swelling noted to popliteal fossa and trace edema to right foot. No bruising.  Neurological: She is alert and oriented to person, place, and time.  Skin: Skin is warm and dry.  Psychiatric: She has a normal mood and affect. Her behavior is normal.  Nursing note and vitals reviewed.    ED Treatments / Results  DIAGNOSTIC STUDIES: Oxygen Saturation is 100% on RA, normal by my interpretation.   COORDINATION OF CARE: 1:00 PM- Will order knee immobilizer and crutches. Will prescribe pain medication. Pt verbalizes understanding and agrees to plan.  Medications - No data to display  Labs (all labs ordered are listed, but only abnormal results are displayed) Labs Reviewed - No data to display  EKG  EKG Interpretation None       Radiology No results found.  Procedures Procedures (including critical care time)  Medications Ordered in ED Medications - No data to display   Initial Impression / Assessment and Plan / ED Course  I have reviewed the triage vital signs and the nursing notes.  Pertinent labs & imaging results that were available during my care of the patient were reviewed by me and considered in my medical decision making (see chart for details).     Patient presenting for upper right posterior leg pain that began after being injured at work two days ago. Pt advised to follow up with sports medicine but since this is worker's comp it will have to be approved. Discussed possibility of DVT and pt is able to self assess and return precautions discussed. Patient given knee immobilizer and crutches while in ED, conservative therapy recommended and discussed. Patient will be discharged home & is agreeable with above plan. Returns precautions discussed. Pt appears safe for discharge.   Final Clinical Impressions(s) / ED Diagnoses   Final diagnoses:  Right hamstring injury, initial encounter    New Prescriptions New Prescriptions   No  medications on file    I personally performed the services described in this documentation, which was scribed in my presence. The recorded information has been reviewed and is accurate.       Arthor CaptainHarris, Claudene Gatliff, PA-C 05/11/17 1604    Cathren LaineSteinl, Kevin, MD 05/15/17 [id number]

## 2017-05-22 MED FILL — MELOXICAM 15 MG TABLET: 15 | 30 days supply | Qty: 30 | Fill #0 | Status: TO

## 2017-05-29 ENCOUNTER — Ambulatory Visit: Payer: PRIVATE HEALTH INSURANCE | Attending: Specialist | Admitting: Physical Therapy

## 2017-05-29 DIAGNOSIS — M79604 Pain in right leg: Secondary | ICD-10-CM | POA: Diagnosis present

## 2017-05-29 DIAGNOSIS — R29898 Other symptoms and signs involving the musculoskeletal system: Secondary | ICD-10-CM | POA: Insufficient documentation

## 2017-05-29 NOTE — Patient Instructions (Signed)
Hamstring Step 2   Left foot relaxed, knee straight, other leg bent, foot flat. Raise straight leg further upward to maximal range. Hold __30_ seconds. Relax leg completely down. Repeat __3_ times.  EXTENSION: Prone - Knee Flexed (Active)   Lie on stomach, right knee bent to 90. Lift leg toward ceiling. Use _0__ lbs. Complete _2__ sets of _15__ repetitions.   Bridge   Lie back, legs bent. Inhale, pressing hips up. Keeping ribs in, lengthen lower back. Exhale, rolling down along spine from top. Repeat __15__ times. Do _2___ sessions per day.  Outer Hip Stretch: Reclined IT Band Stretch (Strap)   Strap around opposite foot, pull across only as far as possible with shoulders on mat. Hold for __30__ breaths. Repeat __3__ times each leg.  Gastroc / Heel Cord Stretch - On Step   Stand with heels over edge of stair. Holding rail, lower heels until stretch is felt in calf of legs. Repeat _3__ times. Hold _30__ seconds.

## 2017-05-29 NOTE — Therapy (Signed)
[facility] [address]  [address] [place], [place], [address] Phone: [phone]   Fax:  [phone]  Physical Therapy Evaluation  Patient Details  Name: Kristie Martinez MRN: [id number] Date of Birth: [birth year] Referring Provider: Dr. Valma Cava  Encounter Date: 05/29/2017      PT End of Session - 05/29/17 0929    Visit Number 1   Number of Visits 10   Date for PT Re-Evaluation 07/10/17   Authorization Type WC - 1 Eval + 9 Treat   PT Start Time 0928   PT Stop Time 1012   PT Time Calculation (min) 44 min   Activity Tolerance Patient tolerated treatment well   Behavior During Therapy [facility] for tasks assessed/performed      Past Medical History:  Diagnosis Date  . Indigestion   . Varicose veins     Past Surgical History:  Procedure Laterality Date  . CERVICAL DISC ARTHROPLASTY    . ENDOMETRIAL ABLATION    . ENDOVENOUS ABLATION SAPHENOUS VEIN W/ LASER  09-27-2012   left greater saphenous vein  by Gretta Began MD  . ENDOVENOUS ABLATION SAPHENOUS VEIN W/ LASER  11-08-2012   right greater saphenous vein   by Gretta Began MD  . mini gastric  2004    There were no vitals filed for this visit.       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 Was doing a "take down" - was not comfortable with movement. Felt a pop with immediate pain, tried to put pressure on leg with inability. Used ice for initial 30 min. Went to [employer] - was given some exercises and did them in the pool. Later that night felt another pop - with fall to ground - pain from proxiaml HS to heel. Went to ED - was able to wear short TED hose for swelling. Now having pain in arch of foot, proximal and distal HS.    Diagnostic tests Xray - negative   Patient Stated Goals improve pain and function   Currently in Pain? Yes   Pain Score 2   up to 4/10 with walking   Pain  Location Hip  proximal and distal HS   Pain Orientation Right   Pain Descriptors / Indicators Sore;Aching   Pain Type Acute pain   Pain Onset 1 to 4 weeks ago   Pain Frequency Intermittent   Aggravating Factors  walking, stretching   Pain Relieving Factors Heel: ice; HS: heat            [facility] PT Assessment - 05/29/17 0941      Assessment   Medical Diagnosis R HS strain   Referring Provider Dr. Valma Cava   Onset Date/Surgical Date 05/09/17   Next MD Visit 06/26/17   Prior Therapy not for this issue     Precautions   Precautions None     Restrictions   Weight Bearing Restrictions No     Balance Screen   Has the patient fallen in the past 6 months No   Has the patient had a decrease in activity level because of a fear of falling?  No   Is the patient reluctant to leave their home because of a fear of falling?  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, travelling between sites     Cognition   Overall Cognitive Status Within Functional Limits for tasks assessed     Sensation   Light Touch Appears Intact     Coordination   Gross Motor Movements are Fluid and Coordinated Yes     Posture/Postural Control   Posture/Postural Control No significant limitations     ROM / Strength   AROM / PROM / Strength AROM;Strength     AROM   Overall AROM  Within functional limits for tasks performed   Overall AROM Comments B LE - patient reporting tightness of R LE     Strength   Strength Assessment Site Hip;Knee   Right/Left Hip Right;Left   Right Hip Flexion 3+/5    Right Hip Extension 3/5   Right Hip ABduction 3+/5   Left Hip Flexion 4+/5   Left Hip Extension 4/5   Left Hip ABduction 4-/5   Right/Left Knee Right;Left   Right Knee Flexion 3+/5   Right Knee Extension 4-/5   Left Knee Flexion 5/5   Left Knee Extension 5/5     Flexibility   Soft Tissue Assessment /Muscle Length yes   Hamstrings R moderate tightness     Palpation   Palpation comment tenderness to palpationt proximal and distal HS, gastroc muscle belly, lateral knee at IT band insertion, and arch of R foot            Objective measurements completed on examination: See above findings.          [facility] Adult PT Treatment/Exercise - 05/29/17 0941      Exercises   Exercises Knee/Hip     Knee/Hip Exercises: Stretches   Passive Hamstring Stretch Right;3 reps;30 seconds   Passive Hamstring Stretch Limitations supine with strap   ITB Stretch Right;3 reps;30 seconds   ITB Stretch Limitations supine with strap   Gastroc Stretch Right;3 reps;30 seconds   Gastroc Stretch Limitations prostretch     Knee/Hip Exercises: Seated   Other Seated Knee/Hip Exercises sciatic nerve glide x 5 reps     Knee/Hip Exercises: Supine   Bridges Both;10 reps                PT Education - 05/29/17 [phone]    Education provided Yes   Education Details exam findings, POC, HEP   Person(s) Educated Patient   Methods Explanation;Demonstration;Handout   Comprehension Verbalized understanding;Returned demonstration;Need further instruction             PT Long Term Goals - 05/29/17 1359      PT LONG TERM GOAL #1   Title patient to be independent with advanced HEP (07/10/17)   Status New     PT LONG TERM GOAL #2   Title Patient to improve R HS flexibility to WNL or symmetrical of L LE without pain limiting (07/10/17)   Status New     PT LONG TERM GOAL #3   Title Patient to improve R LE strength to >/= 4+/5 wihtout pain (07/10/17)   Status New     PT LONG TERM GOAL #4   Title  Patient to report ability to drive, sit, and  walk (work duties) for prolonged periods without pain demonstrating improved functional mobility (07/10/17)   Status New                Plan - 05/29/17 1356    Clinical Impression Statement Patient is [age] y/o female presenting to OPPT today for low complexity evaluation of primary complaints of pain in R leg fllowing injury during self-defense class where she heard and felt a pop in R posterior thigh. Patient today with tightness of R HS group with pain limiting as well as reduced strength at R hip and knee. Patient tender to palpation at proximal and distal HS with taut bands present, however, no pain in muscle belly of HS. Patient to benefit from PT to address pain and functional mobility to allow for improved quality of life and return to full time work duties with reduced pain.    Clinical Presentation Stable   Clinical Presentation due to: single body function impaired   Clinical Decision Making Low   Rehab Potential Good   PT Frequency 2x / week   PT Duration 6 weeks   PT Treatment/Interventions ADLs/Self Care Home Management;Cryotherapy;Electrical Stimulation;Moist Heat;Ultrasound;Neuromuscular re-education;Balance training;Therapeutic exercise;Iontophoresis 4mg /ml Dexamethasone;Therapeutic activities;Functional mobility training;Patient/family education;Manual techniques;Passive range of motion;Vasopneumatic Device;Taping;Dry needling   Consulted and Agree with Plan of Care Patient      Patient will benefit from skilled therapeutic intervention in order to improve the following deficits and impairments:  Abnormal gait, Decreased activity tolerance, Decreased range of motion, Decreased mobility, Decreased strength, Difficulty walking, Pain  Visit Diagnosis: Pain in right leg - Plan: PT plan of care cert/re-cert  Other symptoms and signs involving the musculoskeletal system - Plan: PT plan of care cert/re-cert     Problem  List Patient Active Problem List   Diagnosis Date Noted  . Chest pain 11/17/2016  . Varicose veins of lower extremities with other complications 05/17/2012     Kipp LaurenceStephanie R Chesni Vos, PT, DPT 05/29/17 2:07 PM   [facility] [place] [facility] [address]  [address] [place], [place], [address] Phone: [phone]   Fax:  [phone]  Name: Kristie Martinez MRN: [id number] Date of Birth: [birth year]

## 2017-05-30 MED FILL — PHENTERMINE 37.5 MG TABLET: 37.5 | 30 days supply | Qty: 30 | Fill #0

## 2017-05-31 ENCOUNTER — Ambulatory Visit: Payer: PRIVATE HEALTH INSURANCE | Admitting: Physical Therapy

## 2017-05-31 DIAGNOSIS — M79604 Pain in right leg: Secondary | ICD-10-CM | POA: Diagnosis not present

## 2017-05-31 DIAGNOSIS — R29898 Other symptoms and signs involving the musculoskeletal system: Secondary | ICD-10-CM

## 2017-05-31 NOTE — Therapy (Signed)
[facility] [address]  [address] [place], [place], [address] Phone: [phone]   Fax:  [phone]  Physical Therapy Treatment  Patient Details  Name: Kristie Martinez MRN: [id number] Date of Birth: 1961/05/20 Referring Provider: Dr. Valma CavaAndrew Collins  Encounter Date: 05/31/2017      PT End of Session - 05/31/17 1704    Visit Number 2   Number of Visits 10   Date for PT Re-Evaluation 07/10/17   Authorization Type WC - 1 Eval + 9 Treat   PT Start Time 1659   PT Stop Time 1745   PT Time Calculation (min) 46 min   Activity Tolerance Patient tolerated treatment well   Behavior During Therapy [facility] for tasks assessed/performed      Past Medical History:  Diagnosis Date  . Indigestion   . Varicose veins     Past Surgical History:  Procedure Laterality Date  . CERVICAL DISC ARTHROPLASTY    . ENDOMETRIAL ABLATION    . ENDOVENOUS ABLATION SAPHENOUS VEIN W/ LASER  09-27-2012   left greater saphenous vein  by Gretta Beganodd Early MD  . ENDOVENOUS ABLATION SAPHENOUS VEIN W/ LASER  11-08-2012   right greater saphenous vein   by Gretta Beganodd Early MD  . mini gastric  2004    There were no vitals filed for this visit.      Subjective Assessment - 05/31/17 1702    Subjective Has done a good bit of sitting today - some pain in buttocks and "catching" at distal HS   Patient Stated Goals improve pain and function   Currently in Pain? Yes   Pain Score 2    Pain Location Hip   Pain Orientation Right   Pain Descriptors / Indicators Tightness;Sore   Pain Type Acute pain                         [facility] Adult PT Treatment/Exercise - 05/31/17 1704      Knee/Hip Exercises: Stretches   Passive Hamstring Stretch Right;3 reps;30 seconds   Passive Hamstring Stretch Limitations supine with strap     Knee/Hip Exercises: Aerobic   Stationary Bike L1 x 6 minutes     Knee/Hip Exercises: Machines for Strengthening   Cybex Knee Flexion B  LE - 20# x 15 reps     Knee/Hip Exercises: Standing   Heel Raises 15 reps   Heel Raises Limitations B con/R ecc   Functional Squat 15 reps   Functional Squat Limitations TRX - VC for form     Knee/Hip Exercises: Seated   Hamstring Curl Right;15 reps   Hamstring Limitations red tband     Knee/Hip Exercises: Supine   Bridges Both;15 reps   Other Supine Knee/Hip Exercises straight leg bridge - feet on peanut ball x 15 reps     Knee/Hip Exercises: Prone   Hip Extension Right;15 reps     Manual Therapy   Manual Therapy Soft tissue mobilization   Manual therapy comments patient prone; education on use of roller stick for self STM   Soft tissue mobilization STM to distal medial HS - very sore                     PT Long Term Goals - 05/29/17 1359      PT LONG TERM GOAL #1   Title patient to be independent with advanced HEP (07/10/17)   Status New     PT  LONG TERM GOAL #2   Title Patient to improve R HS flexibility to WNL or symmetrical of L LE without pain limiting (07/10/17)   Status New     PT LONG TERM GOAL #3   Title Patient to improve R LE strength to >/= 4+/5 wihtout pain (07/10/17)   Status New     PT LONG TERM GOAL #4   Title Patient to report ability to drive, sit, and walk (work duties) for prolonged periods without pain demonstrating improved functional mobility (07/10/17)   Status New               Plan - 05/31/17 1704    Clinical Impression Statement Patient with some pain increase today - may be due to a difficult patient case yesterday or from prolonged sitting in meetings today. PT session focusing on HS stretching and strengthening. STM to medial HS insertion today with noted tissue tightness. Discussion of trial of DN at next visit if symtpoms persist if symptoms continue to persist into next week.    PT Treatment/Interventions ADLs/Self Care Home Management;Cryotherapy;Electrical Stimulation;Moist Heat;Ultrasound;Neuromuscular  re-education;Balance training;Therapeutic exercise;Iontophoresis 4mg /ml Dexamethasone;Therapeutic activities;Functional mobility training;Patient/family education;Manual techniques;Passive range of motion;Vasopneumatic Device;Taping;Dry needling   Consulted and Agree with Plan of Care Patient      Patient will benefit from skilled therapeutic intervention in order to improve the following deficits and impairments:  Abnormal gait, Decreased activity tolerance, Decreased range of motion, Decreased mobility, Decreased strength, Difficulty walking, Pain  Visit Diagnosis: Pain in right leg  Other symptoms and signs involving the musculoskeletal system     Problem List Patient Active Problem List   Diagnosis Date Noted  . Chest pain 11/17/2016  . Varicose veins of lower extremities with other complications 05/17/2012     Kipp Laurence, PT, DPT 05/31/17 6:11 PM   [facility] [address]  [address] [place], [place], [address] Phone: [phone]   Fax:  [phone]  Name: Kristie Martinez MRN: [id number] Date of Birth: Oct 25, 1961

## 2017-05-31 NOTE — Patient Instructions (Signed)

## 2017-06-05 ENCOUNTER — Ambulatory Visit: Payer: PRIVATE HEALTH INSURANCE | Admitting: Physical Therapy

## 2017-06-05 DIAGNOSIS — M79604 Pain in right leg: Secondary | ICD-10-CM

## 2017-06-05 DIAGNOSIS — R29898 Other symptoms and signs involving the musculoskeletal system: Secondary | ICD-10-CM

## 2017-06-05 NOTE — Therapy (Signed)
[facility] [place] [address]  [address] [place], [place], [address] Phone: [phone]   Fax:  [phone]  Physical Therapy Treatment  Patient Details  Name: Kristie RouteDenise E Ostermann MRN: [id number] Date of Birth: 10/04/1961 Referring Provider: Dr. Valma CavaAndrew Collins  Encounter Date: 06/05/2017      PT End of Session - 06/05/17 0900    Visit Number 3   Number of Visits 10   Date for PT Re-Evaluation 07/10/17   Authorization Type WC - 1 Eval + 9 Treat   PT Start Time 0858  pt late   PT Stop Time 0931   PT Time Calculation (min) 33 min   Activity Tolerance Patient tolerated treatment well   Behavior During Therapy [facility] for tasks assessed/performed      Past Medical History:  Diagnosis Date  . Indigestion   . Varicose veins     Past Surgical History:  Procedure Laterality Date  . CERVICAL DISC ARTHROPLASTY    . ENDOMETRIAL ABLATION    . ENDOVENOUS ABLATION SAPHENOUS VEIN W/ LASER  09-27-2012   left greater saphenous vein  by Gretta Beganodd Early MD  . ENDOVENOUS ABLATION SAPHENOUS VEIN W/ LASER  11-08-2012   right greater saphenous vein   by Gretta Beganodd Early MD  . mini gastric  2004    There were no vitals filed for this visit.      Subjective Assessment - 06/05/17 0900    Subjective got a roller stick for self-STM; felt good [place] the beach   Patient Stated Goals improve pain and function   Currently in Pain? No/denies   Pain Score 0-No pain                         [facility] Adult PT Treatment/Exercise - 06/05/17 0901      Knee/Hip Exercises: Stretches   Passive Hamstring Stretch Right;3 reps;30 seconds   Passive Hamstring Stretch Limitations seated with R LE extended     Knee/Hip Exercises: Aerobic   Stationary Bike L2 x 4 minutes     Knee/Hip Exercises: Machines for Strengthening   Cybex Knee Extension B LE 20# x 15 reps; 15# B con/R ecc x 15 reps     Knee/Hip Exercises: Standing   Heel Raises 15 reps   Heel Raises Limitations B con/R ecc   Functional Squat 15 reps   Functional Squat Limitations TRX + heel raise   Other Standing Knee Exercises Fitter - 2 blue bands - R hip extension x 15 reps     Knee/Hip Exercises: Supine   Other Supine Knee/Hip Exercises straight leg bridge - feet on peanut ball x 10 reps; attempted streight leg bridge with HS curl - unable     Manual Therapy   Manual therapy comments patient education to use firm ball as self-STM to posterior thigh/glute musculature.                     PT Long Term Goals - 06/05/17 0901      PT LONG TERM GOAL #1   Title patient to be independent with advanced HEP (07/10/17)   Status On-going     PT LONG TERM GOAL #2   Title Patient to improve R HS flexibility to WNL or symmetrical of L LE without pain limiting (07/10/17)   Status On-going     PT LONG TERM GOAL #3   Title Patient to improve R LE strength to >/=  4+/5 wihtout pain (07/10/17)   Status On-going     PT LONG TERM GOAL #4   Title Patient to report ability to drive, sit, and walk (work duties) for prolonged periods without pain demonstrating improved functional mobility (07/10/17)   Status On-going               Plan - 06/05/17 0901    Clinical Impression Statement Patient late today limiting full treatment time. Patient with subjective reports of reduced pain symtpoms this weekend following completion of HEP. Some pain reproduced during fast/prolonged walking likely due to increased stess and stretch placed on HS musculature. Patient doing well today with all activities. Unable to perform straight leg bridge with HS curl due to general weakness at hip/glutes. Will continue to progress as tolerated.    PT Treatment/Interventions ADLs/Self Care Home Management;Cryotherapy;Electrical Stimulation;Moist Heat;Ultrasound;Neuromuscular re-education;Balance training;Therapeutic exercise;Iontophoresis 4mg /ml Dexamethasone;Therapeutic activities;Functional  mobility training;Patient/family education;Manual techniques;Passive range of motion;Vasopneumatic Device;Taping;Dry needling   Consulted and Agree with Plan of Care Patient      Patient will benefit from skilled therapeutic intervention in order to improve the following deficits and impairments:  Abnormal gait, Decreased activity tolerance, Decreased range of motion, Decreased mobility, Decreased strength, Difficulty walking, Pain  Visit Diagnosis: Pain in right leg  Other symptoms and signs involving the musculoskeletal system     Problem List Patient Active Problem List   Diagnosis Date Noted  . Chest pain 11/17/2016  . Varicose veins of lower extremities with other complications 05/17/2012     Kipp Laurence, PT, DPT 06/05/17 9:39 AM   [facility] [address]  [address] [place], [place], [address] Phone: [phone]   Fax:  [phone]  Name: Kristie Martinez MRN: [id number] Date of Birth: [birth year]

## 2017-06-05 NOTE — Patient Instructions (Signed)
Seated Hamstring Stretch    With R leg out straight bend forward at hips keeping trunk tall - 3 x 30 seconds

## 2017-06-09 ENCOUNTER — Ambulatory Visit: Payer: PRIVATE HEALTH INSURANCE

## 2017-06-09 DIAGNOSIS — R29898 Other symptoms and signs involving the musculoskeletal system: Secondary | ICD-10-CM

## 2017-06-09 DIAGNOSIS — M79604 Pain in right leg: Secondary | ICD-10-CM

## 2017-06-09 NOTE — Therapy (Signed)
[facility] [address]  [address] [place], [place], [address] Phone: [phone]   Fax:  [phone]  Physical Therapy Treatment  Patient Details  Name: Kristie Martinez MRN: [id number] Date of Birth: [birth year] Referring Provider: Dr. Valma Cava  Encounter Date: 06/09/2017      PT End of Session - 06/09/17 0816    Visit Number 4   Number of Visits 10   Date for PT Re-Evaluation 07/10/17   Authorization Type WC - 1 Eval + 9 Treat   PT Start Time 0806  pt. arrived late    PT Stop Time 0846   PT Time Calculation (min) 40 min   Activity Tolerance Patient tolerated treatment well   Behavior During Therapy [facility] for tasks assessed/performed      Past Medical History:  Diagnosis Date  . Indigestion   . Varicose veins     Past Surgical History:  Procedure Laterality Date  . CERVICAL DISC ARTHROPLASTY    . ENDOMETRIAL ABLATION    . ENDOVENOUS ABLATION SAPHENOUS VEIN W/ LASER  09-27-2012   left greater saphenous vein  by Gretta Began MD  . ENDOVENOUS ABLATION SAPHENOUS VEIN W/ LASER  11-08-2012   right greater saphenous vein   by Gretta Began MD  . mini gastric  2004    There were no vitals filed for this visit.      Subjective Assessment - 06/09/17 0808    Subjective Pt. noting improvement in R leg pain.     Patient Stated Goals improve pain and function   Currently in Pain? No/denies   Pain Score 0-No pain   Multiple Pain Sites No                         [facility] Adult PT Treatment/Exercise - 06/09/17 0810      Knee/Hip Exercises: Stretches   Passive Hamstring Stretch Right;3 reps;30 seconds   Passive Hamstring Stretch Limitations supine with strap    Gastroc Stretch Right;3 reps;30 seconds   Gastroc Stretch Limitations prostretch     Knee/Hip Exercises: Aerobic   Nustep Lvl 3 , 6 min      Knee/Hip Exercises: Standing   Heel Raises 15 reps   Heel Raises Limitations B con/R ecc   Knee  Flexion Right;10 reps   Knee Flexion Limitations 2#    Hip Flexion Right;Left;15 reps;Knee straight   Hip Flexion Limitations 2#; at counter    Hip Abduction Right;Left;1 set;15 reps;Knee straight   Abduction Limitations red looped TB around ankles    Hip Extension Right;Left;15 reps;Knee straight   Extension Limitations red looped TB around ankles   Functional Squat 15 reps   Functional Squat Limitations TRX + heel raise     Knee/Hip Exercises: Seated   Hamstring Curl Right;15 reps   Hamstring Limitations red tband     Knee/Hip Exercises: Supine   Heel Slides Right;15 reps   Heel Slides Limitations with pillow base on shoe                      PT Long Term Goals - 06/05/17 0901      PT LONG TERM GOAL #1   Title patient to be independent with advanced HEP (07/10/17)   Status On-going     PT LONG TERM GOAL #2   Title Patient to improve R HS flexibility to WNL or symmetrical of L LE without pain limiting (07/10/17)  Status On-going     PT LONG TERM GOAL #3   Title Patient to improve R LE strength to >/= 4+/5 wihtout pain (07/10/17)   Status On-going     PT LONG TERM GOAL #4   Title Patient to report ability to drive, sit, and walk (work duties) for prolonged periods without pain demonstrating improved functional mobility (07/10/17)   Status On-going               Plan - 06/09/17 0820    Clinical Impression Statement Pt. doing well today.  Reports she feels R HS is improving and has purchased "roller stick" for home use without issue.  Tolerated all LE strengthening activities today well.  Seems to be progress well at this point.     PT Treatment/Interventions ADLs/Self Care Home Management;Cryotherapy;Electrical Stimulation;Moist Heat;Ultrasound;Neuromuscular re-education;Balance training;Therapeutic exercise;Iontophoresis 4mg /ml Dexamethasone;Therapeutic activities;Functional mobility training;Patient/family education;Manual techniques;Passive range of  motion;Vasopneumatic Device;Taping;Dry needling      Patient will benefit from skilled therapeutic intervention in order to improve the following deficits and impairments:  Abnormal gait, Decreased activity tolerance, Decreased range of motion, Decreased mobility, Decreased strength, Difficulty walking, Pain  Visit Diagnosis: Pain in right leg  Other symptoms and signs involving the musculoskeletal system     Problem List Patient Active Problem List   Diagnosis Date Noted  . Chest pain 11/17/2016  . Varicose veins of lower extremities with other complications 05/17/2012    Kermit BaloMicah Chenee Munns, PTA 06/09/17 12:04 PM  [facility] [address]  [address] [place], [place], [address] Phone: [phone]   Fax:  [phone]  Name: Kristie Martinez MRN: [id number] Date of Birth: [birth year]

## 2017-06-12 ENCOUNTER — Ambulatory Visit: Payer: PRIVATE HEALTH INSURANCE | Admitting: Physical Therapy

## 2017-06-12 DIAGNOSIS — R29898 Other symptoms and signs involving the musculoskeletal system: Secondary | ICD-10-CM

## 2017-06-12 DIAGNOSIS — M79604 Pain in right leg: Secondary | ICD-10-CM | POA: Diagnosis not present

## 2017-06-12 NOTE — Therapy (Signed)
[facility] [place] [address]  [address] [place], [place], [address] Phone: [phone]   Fax:  [phone]  Physical Therapy Treatment  Patient Details  Name: Kristie Martinez MRN: [id number] Date of Birth: 1961-07-19 Referring Provider: Dr. Valma CavaAndrew Collins  Encounter Date: 06/12/2017      PT End of Session - 06/12/17 0806    Visit Number 5   Number of Visits 10   Date for PT Re-Evaluation 07/10/17   Authorization Type WC - 1 Eval + 9 Treat   PT Start Time 0805   PT Stop Time 0847   PT Time Calculation (min) 42 min   Activity Tolerance Patient tolerated treatment well   Behavior During Therapy [facility] for tasks assessed/performed      Past Medical History:  Diagnosis Date  . Indigestion   . Varicose veins     Past Surgical History:  Procedure Laterality Date  . CERVICAL DISC ARTHROPLASTY    . ENDOMETRIAL ABLATION    . ENDOVENOUS ABLATION SAPHENOUS VEIN W/ LASER  09-27-2012   left greater saphenous vein  by Gretta Beganodd Early MD  . ENDOVENOUS ABLATION SAPHENOUS VEIN W/ LASER  11-08-2012   right greater saphenous vein   by Gretta Beganodd Early MD  . mini gastric  2004    There were no vitals filed for this visit.      Subjective Assessment - 06/12/17 0805    Subjective "It's doing really well - I think getting in the pool is helping."   Diagnostic tests Xray - negative   Patient Stated Goals improve pain and function   Currently in Pain? No/denies   Pain Score 0-No pain  just stretching with bike warm-up                         [facility]/Exercise - 06/12/17 0807      Knee/Hip Exercises: Stretches   Passive Hamstring Stretch Right;3 reps;30 seconds   Passive Hamstring Stretch Limitations seated with R LE extended     Knee/Hip Exercises: Aerobic   Stationary Bike L3 x 6 minutes     Knee/Hip Exercises: Machines for Strengthening   Cybex Knee Flexion 15# B con/R ecc x 15     Knee/Hip Exercises:  Standing   Hip Flexion Right;15 reps;Knee straight   Hip Flexion Limitations green band   Hip ADduction Right;15 reps   Hip ADduction Limitations green tband   Hip Abduction Right;15 reps;Knee straight   Abduction Limitations green tband   Hip Extension Right;15 reps;Knee straight   Extension Limitations green tband   Step Down Right;15 reps;Hand Hold: 1;Step Height: 6"   Functional Squat 15 reps;3 seconds   Functional Squat Limitations TRX     Knee/Hip Exercises: Seated   Stool Scoot - Round Trips 75 feet - HS focused                     PT Long Term Goals - 06/05/17 0901      PT LONG TERM GOAL #1   Title patient to be independent with advanced HEP (07/10/17)   Status On-going     PT LONG TERM GOAL #2   Title Patient to improve R HS flexibility to WNL or symmetrical of L LE without pain limiting (07/10/17)   Status On-going     PT LONG TERM GOAL #3   Title Patient to improve R LE strength to >/= 4+/5 wihtout pain (07/10/17)  Status On-going     PT LONG TERM GOAL #4   Title Patient to report ability to drive, sit, and walk (work duties) for prolonged periods without pain demonstrating improved functional mobility (07/10/17)   Status On-going               Plan - 06/12/17 0806    Clinical Impression Statement Patient doing well today - has noticed an improvement in symptoms but does have pain with fast walking and prolonged sitting requiring frequent position changes and stretching. Patient doing well with all strengthenign and making good progress towards goals.    PT Treatment/Interventions ADLs/Self Care Home Management;Cryotherapy;Electrical Stimulation;Moist Heat;Ultrasound;Neuromuscular re-education;Balance training;Therapeutic exercise;Iontophoresis 4mg /ml Dexamethasone;Therapeutic activities;Functional mobility training;Patient/family education;Manual techniques;Passive range of motion;Vasopneumatic Device;Taping;Dry needling   Consulted and Agree with  Plan of Care Patient      Patient will benefit from skilled therapeutic intervention in order to improve the following deficits and impairments:  Abnormal gait, Decreased activity tolerance, Decreased range of motion, Decreased mobility, Decreased strength, Difficulty walking, Pain  Visit Diagnosis: Pain in right leg  Other symptoms and signs involving the musculoskeletal system     Problem List Patient Active Problem List   Diagnosis Date Noted  . Chest pain 11/17/2016  . Varicose veins of lower extremities with other complications 05/17/2012    Kipp Laurence, PT, DPT 06/12/17 8:52 AM   [facility] [address]  [address] [place], [place], [address] Phone: [phone]   Fax:  [phone]  Name: Kristie Martinez MRN: [id number] Date of Birth: 1961/08/14

## 2017-06-16 ENCOUNTER — Ambulatory Visit: Payer: PRIVATE HEALTH INSURANCE | Admitting: Physical Therapy

## 2017-06-16 DIAGNOSIS — M79604 Pain in right leg: Secondary | ICD-10-CM | POA: Diagnosis not present

## 2017-06-16 DIAGNOSIS — R29898 Other symptoms and signs involving the musculoskeletal system: Secondary | ICD-10-CM

## 2017-06-16 NOTE — Therapy (Signed)
[facility] [address]  [address] [place], [place], [address] Phone: [phone]   Fax:  [phone]  Physical Therapy Treatment  Patient Details  Name: Kristie Martinez MRN: [id number] Date of Birth: 27-Apr-1961 Referring Provider: Dr. Valma CavaAndrew Collins  Encounter Date: 06/16/2017      PT End of Session - 06/16/17 0855    Visit Number 6   Number of Visits 10   Date for PT Re-Evaluation 07/10/17   Authorization Type WC - 1 Eval + 9 Treat   PT Start Time 0852   PT Stop Time 0931   PT Time Calculation (min) 39 min   Activity Tolerance Patient tolerated treatment well   Behavior During Therapy [facility] for tasks assessed/performed      Past Medical History:  Diagnosis Date  . Indigestion   . Varicose veins     Past Surgical History:  Procedure Laterality Date  . CERVICAL DISC ARTHROPLASTY    . ENDOMETRIAL ABLATION    . ENDOVENOUS ABLATION SAPHENOUS VEIN W/ LASER  09-27-2012   left greater saphenous vein  by Gretta Beganodd Early MD  . ENDOVENOUS ABLATION SAPHENOUS VEIN W/ LASER  11-08-2012   right greater saphenous vein   by Gretta Beganodd Early MD  . mini gastric  2004    There were no vitals filed for this visit.      Subjective Assessment - 06/16/17 0854    Subjective having some pain at distal medial hamstring - "90% of time walking up hill isn't bad"   Patient Stated Goals improve pain and function   Currently in Pain? Yes   Pain Score 2    Pain Location Leg   Pain Orientation Right;Posterior;Upper;Mid   Pain Descriptors / Indicators Aching   Pain Type Acute pain                         [facility] Adult PT Treatment/Exercise - 06/16/17 0858      Knee/Hip Exercises: Aerobic   Stationary Bike L3 x 5 min     Knee/Hip Exercises: Machines for Strengthening   Cybex Knee Flexion 15# B con/R ecc x 15     Knee/Hip Exercises: Standing   Forward Lunges Right;10 reps   Forward Lunges Limitations TRX   Hip Extension  Right;15 reps;Knee straight   Extension Limitations Fitter - 2 blue bands   Functional Squat 15 reps;3 seconds   Functional Squat Limitations TRX     Knee/Hip Exercises: Seated   Sit to Sand 10 reps;without UE support  B LE on foam - L foot elevated to target R LE dominant     Modalities   Modalities Ultrasound     Ultrasound   Ultrasound Location R distal medial hamstring insertion   Ultrasound Parameters 1.0 x 1.5 w/cm2   Ultrasound Goals Pain                     PT Long Term Goals - 06/05/17 0901      PT LONG TERM GOAL #1   Title patient to be independent with advanced HEP (07/10/17)   Status On-going     PT LONG TERM GOAL #2   Title Patient to improve R HS flexibility to WNL or symmetrical of L LE without pain limiting (07/10/17)   Status On-going     PT LONG TERM GOAL #3   Title Patient to improve R LE strength to >/= 4+/5 wihtout pain (07/10/17)  Status On-going     PT LONG TERM GOAL #4   Title Patient to report ability to drive, sit, and walk (work duties) for prolonged periods without pain demonstrating improved functional mobility (07/10/17)   Status On-going               Plan - 06/16/17 0856    Clinical Impression Statement patient reporitng 90% improvement in symtpoms since starting PT. Doing well with all hamstring and glute targeted strengthening. Education to continue to work in extension based movemetns for posterior chain strengthening during vacation wiht good understanding.    PT Treatment/Interventions ADLs/Self Care Home Management;Cryotherapy;Electrical Stimulation;Moist Heat;Ultrasound;Neuromuscular re-education;Balance training;Therapeutic exercise;Iontophoresis 4mg /ml Dexamethasone;Therapeutic activities;Functional mobility training;Patient/family education;Manual techniques;Passive range of motion;Vasopneumatic Device;Taping;Dry needling   Consulted and Agree with Plan of Care Patient      Patient will benefit from skilled  therapeutic intervention in order to improve the following deficits and impairments:  Abnormal gait, Decreased activity tolerance, Decreased range of motion, Decreased mobility, Decreased strength, Difficulty walking, Pain  Visit Diagnosis: Pain in right leg  Other symptoms and signs involving the musculoskeletal system     Problem List Patient Active Problem List   Diagnosis Date Noted  . Chest pain 11/17/2016  . Varicose veins of lower extremities with other complications 05/17/2012     Kipp LaurenceStephanie R Ezrie Bunyan, PT, DPT 06/16/17 11:03 AM   [facility] [place] [address]  [address] [place], [place], [address] Phone: [phone]   Fax:  [phone]  Name: Kristie Martinez MRN: [id number] Date of Birth: [birth year]

## 2017-06-27 ENCOUNTER — Ambulatory Visit: Payer: PRIVATE HEALTH INSURANCE | Attending: Specialist | Admitting: Physical Therapy

## 2017-06-27 DIAGNOSIS — R29898 Other symptoms and signs involving the musculoskeletal system: Secondary | ICD-10-CM | POA: Insufficient documentation

## 2017-06-27 DIAGNOSIS — M79604 Pain in right leg: Secondary | ICD-10-CM | POA: Diagnosis present

## 2017-06-27 NOTE — Therapy (Signed)
[facility] [address]  [address] [place], [place], [address] Phone: [phone]   Fax:  [phone]  Physical Therapy Treatment  Patient Details  Name: Kristie Martinez MRN: [id number] Date of Birth: 01-Feb-1961 Referring Provider: Dr. Valma Cava  Encounter Date: 06/27/2017      PT End of Session - 06/27/17 0809    Visit Number 7   Number of Visits 10   Date for PT Re-Evaluation 07/10/17   Authorization Type WC - 1 Eval + 9 Treat   PT Start Time 0807   PT Stop Time 0845   PT Time Calculation (min) 38 min   Activity Tolerance Patient tolerated treatment well   Behavior During Therapy [facility] for tasks assessed/performed      Past Medical History:  Diagnosis Date  . Indigestion   . Varicose veins     Past Surgical History:  Procedure Laterality Date  . CERVICAL DISC ARTHROPLASTY    . ENDOMETRIAL ABLATION    . ENDOVENOUS ABLATION SAPHENOUS VEIN W/ LASER  09-27-2012   left greater saphenous vein  by Gretta Began MD  . ENDOVENOUS ABLATION SAPHENOUS VEIN W/ LASER  11-08-2012   right greater saphenous vein   by Gretta Began MD  . mini gastric  2004    There were no vitals filed for this visit.      Subjective Assessment - 06/27/17 0808    Subjective was able to ride bike and elliptical as well as play volleyball on vacation; saw PA - complete POC as outlined   Patient Stated Goals improve pain and function   Currently in Pain? No/denies   Pain Score 0-No pain                         [facility] Adult PT Treatment/Exercise - 06/27/17 0810      Knee/Hip Exercises: Stretches   Gastroc Stretch Right;3 reps;30 seconds   Gastroc Stretch Limitations prostretch     Knee/Hip Exercises: Aerobic   Stationary Bike L3 x 5 minutes     Knee/Hip Exercises: Machines for Strengthening   Cybex Knee Flexion 25# B LE x 15; 10# B con/R ecc x 15     Knee/Hip Exercises: Standing   Hip Extension Right;15 reps   Extension  Limitations Fitter - 2 blue bands   Functional Squat 15 reps   Functional Squat Limitations TRX + heel raise     Knee/Hip Exercises: Seated   Stool Scoot - Round Trips 100 feet - HS focused     Knee/Hip Exercises: Prone   Hamstring Curl 1 set;15 reps   Hamstring Curl Limitations 3#                     PT Long Term Goals - 06/05/17 0901      PT LONG TERM GOAL #1   Title patient to be independent with advanced HEP (07/10/17)   Status On-going     PT LONG TERM GOAL #2   Title Patient to improve R HS flexibility to WNL or symmetrical of L LE without pain limiting (07/10/17)   Status On-going     PT LONG TERM GOAL #3   Title Patient to improve R LE strength to >/= 4+/5 wihtout pain (07/10/17)   Status On-going     PT LONG TERM GOAL #4   Title Patient to report ability to drive, sit, and walk (work duties) for prolonged periods without pain  demonstrating improved functional mobility (07/10/17)   Status On-going               Plan - 06/27/17 0810    Clinical Impression Statement Patient reporting continued improvement in symptoms with ability to walk on sand play volleyball, and walk in ocean/waves with no onset of posterior thigh pain. Patient noting some tightness and soreness in HS and gastroc which easily resolves with stretching and strengthening activities. Will continue to progress towards goals.    PT Treatment/Interventions ADLs/Self Care Home Management;Cryotherapy;Electrical Stimulation;Moist Heat;Ultrasound;Neuromuscular re-education;Balance training;Therapeutic exercise;Iontophoresis 4mg /ml Dexamethasone;Therapeutic activities;Functional mobility training;Patient/family education;Manual techniques;Passive range of motion;Vasopneumatic Device;Taping;Dry needling   Consulted and Agree with Plan of Care Patient      Patient will benefit from skilled therapeutic intervention in order to improve the following deficits and impairments:  Abnormal gait, Decreased  activity tolerance, Decreased range of motion, Decreased mobility, Decreased strength, Difficulty walking, Pain  Visit Diagnosis: Pain in right leg  Other symptoms and signs involving the musculoskeletal system     Problem List Patient Active Problem List   Diagnosis Date Noted  . Chest pain 11/17/2016  . Varicose veins of lower extremities with other complications 05/17/2012    Kipp LaurenceStephanie R Laraina Sulton, PT, DPT 06/27/17 10:27 AM   [facility] [place] [address]  [address] [place], [place], [address] Phone: [phone]   Fax:  [phone]  Name: Kristie Martinez MRN: [id number] Date of Birth: 1960/12/01

## 2017-06-30 ENCOUNTER — Ambulatory Visit: Payer: PRIVATE HEALTH INSURANCE | Admitting: Physical Therapy

## 2017-06-30 DIAGNOSIS — M79604 Pain in right leg: Secondary | ICD-10-CM | POA: Diagnosis not present

## 2017-06-30 DIAGNOSIS — R29898 Other symptoms and signs involving the musculoskeletal system: Secondary | ICD-10-CM

## 2017-06-30 NOTE — Therapy (Signed)
[facility] [address]  [address] [place], [place], [address] Phone: [phone]   Fax:  [phone]  Physical Therapy Treatment  Patient Details  Name: Kristie Martinez MRN: [id number] Date of Birth: Jan 31, 1961 Referring Provider: Dr. Valma Cava  Encounter Date: 06/30/2017      PT End of Session - 06/30/17 0804    Visit Number 8   Number of Visits 10   Date for PT Re-Evaluation 07/10/17   Authorization Type WC - 1 Eval + 9 Treat   PT Start Time 0801   PT Stop Time 0839   PT Time Calculation (min) 38 min   Activity Tolerance Patient tolerated treatment well   Behavior During Therapy [facility] for tasks assessed/performed      Past Medical History:  Diagnosis Date  . Indigestion   . Varicose veins     Past Surgical History:  Procedure Laterality Date  . CERVICAL DISC ARTHROPLASTY    . ENDOMETRIAL ABLATION    . ENDOVENOUS ABLATION SAPHENOUS VEIN W/ LASER  09-27-2012   left greater saphenous vein  by Gretta Began MD  . ENDOVENOUS ABLATION SAPHENOUS VEIN W/ LASER  11-08-2012   right greater saphenous vein   by Gretta Began MD  . mini gastric  2004    There were no vitals filed for this visit.      Subjective Assessment - 06/30/17 0803    Subjective feeling well;    Diagnostic tests Xray - negative   Patient Stated Goals improve pain and function   Currently in Pain? No/denies   Pain Score 0-No pain                         [facility] Adult PT Treatment/Exercise - 06/30/17 0805      Knee/Hip Exercises: Aerobic   Stationary Bike L3 x 6 min     Knee/Hip Exercises: Machines for Strengthening   Cybex Knee Extension 25# B LE x 15   Cybex Knee Flexion 25# B LE x 15; 15# B con/R ecc x 15     Knee/Hip Exercises: Standing   Knee Flexion Right;15 reps   Knee Flexion Limitations 3# - standing HS curls - slow eccentric   Hip Abduction Right;15 reps;Knee straight   Abduction Limitations fitter - 2 blue bands    Hip Extension Right;15 reps   Extension Limitations Fitter - 2 blue bands   Functional Squat 15 reps   Functional Squat Limitations TRX + heel raise   Wall Squat 15 reps   Wall Squat Limitations with orange pball against wall   Other Standing Knee Exercises bwd monster walks - 2 x 30 feet - red tband   Other Standing Knee Exercises side stepping - 30 feet each direction - red tband                     PT Long Term Goals - 06/05/17 0901      PT LONG TERM GOAL #1   Title patient to be independent with advanced HEP (07/10/17)   Status On-going     PT LONG TERM GOAL #2   Title Patient to improve R HS flexibility to WNL or symmetrical of L LE without pain limiting (07/10/17)   Status On-going     PT LONG TERM GOAL #3   Title Patient to improve R LE strength to >/= 4+/5 wihtout pain (07/10/17)   Status On-going  PT LONG TERM GOAL #4   Title Patient to report ability to drive, sit, and walk (work duties) for prolonged periods without pain demonstrating improved functional mobility (07/10/17)   Status On-going               Plan - 06/30/17 0805    Clinical Impression Statement Patient continuing to note benefit from PT. Patient reporting now able to acsend/descend stairs with reciprocal gait pattern with improved stability. Doing well today with all posterior chain strengthening with only slight pull in HS muscle group. Making good progress towards goals.    PT Treatment/Interventions ADLs/Self Care Home Management;Cryotherapy;Electrical Stimulation;Moist Heat;Ultrasound;Neuromuscular re-education;Balance training;Therapeutic exercise;Iontophoresis 4mg /ml Dexamethasone;Therapeutic activities;Functional mobility training;Patient/family education;Manual techniques;Passive range of motion;Vasopneumatic Device;Taping;Dry needling   Consulted and Agree with Plan of Care Patient      Patient will benefit from skilled therapeutic intervention in order to improve the  following deficits and impairments:  Abnormal gait, Decreased activity tolerance, Decreased range of motion, Decreased mobility, Decreased strength, Difficulty walking, Pain  Visit Diagnosis: Pain in right leg  Other symptoms and signs involving the musculoskeletal system     Problem List Patient Active Problem List   Diagnosis Date Noted  . Chest pain 11/17/2016  . Varicose veins of lower extremities with other complications 05/17/2012     Kipp LaurenceStephanie R Tanara Turvey, PT, DPT 06/30/17 8:42 AM   [facility] [place] [address]  [address] [place], [place], [address] Phone: [phone]   Fax:  [phone]  Name: Kristie Martinez MRN: [id number] Date of Birth: 1961-08-25

## 2017-07-03 MED FILL — PHENTERMINE 37.5 MG TABLET: 37.5 | 30 days supply | Qty: 30 | Fill #1

## 2017-07-03 MED FILL — MELOXICAM 15 MG TABLET: 15 | 30 days supply | Qty: 30 | Fill #0

## 2017-07-04 MED FILL — PANTOPRAZOLE SOD DR 40 MG T: 40 | 90 days supply | Qty: 90 | Fill #3 | Status: TO

## 2017-07-06 ENCOUNTER — Ambulatory Visit: Payer: PRIVATE HEALTH INSURANCE

## 2017-07-10 ENCOUNTER — Ambulatory Visit: Payer: PRIVATE HEALTH INSURANCE | Admitting: Physical Therapy

## 2017-07-12 ENCOUNTER — Ambulatory Visit: Payer: PRIVATE HEALTH INSURANCE | Admitting: Physical Therapy

## 2017-07-12 DIAGNOSIS — M79604 Pain in right leg: Secondary | ICD-10-CM | POA: Diagnosis not present

## 2017-07-12 DIAGNOSIS — R29898 Other symptoms and signs involving the musculoskeletal system: Secondary | ICD-10-CM

## 2017-07-12 NOTE — Patient Instructions (Addendum)
Knee / Mills Koller on stomach, knees together. Grab one ankle with same side hand. Use towel if needed to reach. Gently pull foot toward buttock. Hold __30__ seconds. Repeat with other leg. Repeat _3___ times. Do __1-2__ sessions per day.  Piriformis Stretch    Lying on back, pull right knee toward opposite shoulder. Hold __30_ seconds. Repeat _3___ times. Do __1-2__ sessions per day.  Piriformis Stretch, Sitting    Sit, one ankle on opposite knee, same-side hand on crossed knee. Push down on knee, keeping spine straight. Lean torso forward, with flat back, until tension is felt in hamstrings and gluteals of crossed-leg side. Hold _30__ seconds.  Repeat _3__ times per session. Do _1-2__ sessions per day.  Adductor Stretch: Gate Pose (Block)    Pelvic tilt engaged, reach down over straight leg. Sweep opposite arm over head. Hold for __30__ breaths. Repeat __3__ times each side. **bend forward at the hips   Trigger Point Dry Needling  . What is Trigger Point Dry Needling (DN)? o DN is a physical therapy technique used to treat muscle pain and dysfunction. Specifically, DN helps deactivate muscle trigger points (muscle knots).  o A thin filiform needle is used to penetrate the skin and stimulate the underlying trigger point. The goal is for a local twitch response (LTR) to occur and for the trigger point to relax. No medication of any kind is injected during the procedure.   . What Does Trigger Point Dry Needling Feel Like?  o The procedure feels different for each individual patient. Some patients report that they do not actually feel the needle enter the skin and overall the process is not painful. Very mild bleeding may occur. However, many patients feel a deep cramping in the muscle in which the needle was inserted. This is the local twitch response.   Marland Kitchen How Will I feel after the treatment? o Soreness is normal, and the onset of soreness may not occur for a few hours.  Typically this soreness does not last longer than two days.  o Bruising is uncommon, however; ice can be used to decrease any possible bruising.  o In rare cases feeling tired or nauseous after the treatment is normal. In addition, your symptoms may get worse before they get better, this period will typically not last longer than 24 hours.   . What Can I do After My Treatment? o Increase your hydration by drinking more water for the next 24 hours. o You may place ice or heat on the areas treated that have become sore, however, do not use heat on inflamed or bruised areas. Heat often brings more relief post needling. o You can continue your regular activities, but vigorous activity is not recommended initially after the treatment for 24 hours. o DN is best combined with other physical therapy such as strengthening, stretching, and other therapies.

## 2017-07-12 NOTE — Therapy (Signed)
[facility] Rehabilitation [facility] [address]  [address] [place], [place], [address] Phone: [phone]   Fax:  [phone]  Physical Therapy Treatment  Patient Details  Name: Kristie Martinez MRN: [id number] Date of Birth: 09-Oct-1961 Referring Provider: Dr. Valma Cava  Encounter Date: 07/12/2017      PT End of Session - 07/12/17 0942    Visit Number 9   Number of Visits 10   Date for PT Re-Evaluation 07/10/17   Authorization Type WC - 1 Eval + 9 Treat   PT Start Time 0854   PT Stop Time 0938   PT Time Calculation (min) 44 min   Activity Tolerance Patient tolerated treatment well   Behavior During Therapy [facility] for tasks assessed/performed      Past Medical History:  Diagnosis Date  . Indigestion   . Varicose veins     Past Surgical History:  Procedure Laterality Date  . CERVICAL DISC ARTHROPLASTY    . ENDOMETRIAL ABLATION    . ENDOVENOUS ABLATION SAPHENOUS VEIN W/ LASER  09-27-2012   left greater saphenous vein  by Gretta Began MD  . ENDOVENOUS ABLATION SAPHENOUS VEIN W/ LASER  11-08-2012   right greater saphenous vein   by Gretta Began MD  . mini gastric  2004    There were no vitals filed for this visit.      Subjective Assessment - 07/12/17 0940    Subjective having some quad and HS pain - walked in the ocean this past weekend    Patient Stated Goals improve pain and function   Currently in Pain? No/denies   Pain Score 0-No pain                         [facility] Adult PT Treatment/Exercise - 07/12/17 0001      Knee/Hip Exercises: Stretches   Lobbyist Right;3 reps;60 seconds   Quad Stretch Limitations prone with strap   Piriformis Stretch Right;3 reps;60 seconds   Piriformis Stretch Limitations seated figure 4   Other Knee/Hip Stretches R hip adductor stretch - 3 x 45-60 seconds     Knee/Hip Exercises: Aerobic   Stationary Bike L3 x 6 min          Trigger Point Dry Needling - 07/12/17 0942    Consent Given? Yes   Education Handout Provided Yes   Muscles Treated Lower Body Quadriceps  R VMO   Quadriceps Response Twitch response elicited;Palpable increased muscle length                   PT Long Term Goals - 06/05/17 0901      PT LONG TERM GOAL #1   Title patient to be independent with advanced HEP (07/10/17)   Status On-going     PT LONG TERM GOAL #2   Title Patient to improve R HS flexibility to WNL or symmetrical of L LE without pain limiting (07/10/17)   Status On-going     PT LONG TERM GOAL #3   Title Patient to improve R LE strength to >/= 4+/5 wihtout pain (07/10/17)   Status On-going     PT LONG TERM GOAL #4   Title Patient to report ability to drive, sit, and walk (work duties) for prolonged periods without pain demonstrating improved functional mobility (07/10/17)   Status On-going               Plan - 07/12/17 0952    Clinical  Impression Statement Kristie Martinez doing well today - noticing some pain symptoms after walking in ocean this weekend. Trigger points noted in distal HS as well as VMO. PT and patient discussing DN treatment with patient and PT agreeing upon this, with good response to DN treatement area. Will plan to complete POC at next visit.    PT Treatment/Interventions ADLs/Self Care Home Management;Cryotherapy;Electrical Stimulation;Moist Heat;Ultrasound;Neuromuscular re-education;Balance training;Therapeutic exercise;Iontophoresis 4mg /ml Dexamethasone;Therapeutic activities;Functional mobility training;Patient/family education;Manual techniques;Passive range of motion;Vasopneumatic Device;Taping;Dry needling   Consulted and Agree with Plan of Care Patient      Patient will benefit from skilled therapeutic intervention in order to improve the following deficits and impairments:  Abnormal gait, Decreased activity tolerance, Decreased range of motion, Decreased mobility, Decreased strength, Difficulty walking, Pain  Visit Diagnosis: Pain in  right leg  Other symptoms and signs involving the musculoskeletal system     Problem List Patient Active Problem List   Diagnosis Date Noted  . Chest pain 11/17/2016  . Varicose veins of lower extremities with other complications 05/17/2012     Kipp Laurence, PT, DPT 07/12/17 9:54 AM   [facility] [address]  [address] [place], [place], [address] Phone: [phone]   Fax:  [phone]  Name: Kristie Martinez MRN: [id number] Date of Birth: 05/19/1961

## 2017-07-18 DIAGNOSIS — H5213 Myopia, bilateral: Secondary | ICD-10-CM | POA: Diagnosis not present

## 2017-07-19 ENCOUNTER — Ambulatory Visit: Payer: PRIVATE HEALTH INSURANCE | Admitting: Physical Therapy

## 2017-07-19 DIAGNOSIS — M79604 Pain in right leg: Secondary | ICD-10-CM

## 2017-07-19 DIAGNOSIS — R29898 Other symptoms and signs involving the musculoskeletal system: Secondary | ICD-10-CM

## 2017-07-19 NOTE — Patient Instructions (Signed)
Adductor Stretch, Standing    Stand on one leg, other leg's ankle on full foam roller at chair height. Keeping body erect, bend standing leg and as it lowers, slide raised leg out on roller. Hold _30-60__ seconds.  Repeat _3__ times per session. Do _2-3__ sessions per day. **Left knee on floor - put weight into R inner thigh

## 2017-07-19 NOTE — Therapy (Addendum)
[facility] [place] [address]  [employer] [place], [place], [address] Phone: [phone]   Fax:  [phone]  Physical Therapy Treatment  Patient Details  Name: Kristie Martinez MRN: [id number] Date of Birth: [birth year] Referring Provider: Dr. Hart Robinsons  Encounter Date: 07/19/2017      PT End of Session - 07/19/17 1456    Visit Number 10   Number of Visits 10   Authorization Type WC - 1 Eval + 9 Treat   PT Start Time 1450   PT Stop Time 1530   PT Time Calculation (min) 40 min   Activity Tolerance Patient tolerated treatment well   Behavior During Therapy [facility] for tasks assessed/performed      Past Medical History:  Diagnosis Date  . Indigestion   . Varicose veins     Past Surgical History:  Procedure Laterality Date  . CERVICAL DISC ARTHROPLASTY    . ENDOMETRIAL ABLATION    . ENDOVENOUS ABLATION SAPHENOUS VEIN W/ LASER  09-27-2012   left greater saphenous vein  by Curt Jews MD  . ENDOVENOUS ABLATION SAPHENOUS VEIN W/ LASER  11-08-2012   right greater saphenous vein   by Curt Jews MD  . mini gastric  2004    There were no vitals filed for this visit.      Subjective Assessment - 07/19/17 1451    Subjective Was a meeting yesterday - had to sit a lot - when she went to get up, had a "[employer]"; feels like Meloxicam has masked symptoms   Diagnostic tests Xray - negative   Patient Stated Goals improve pain and function   Currently in Pain? Yes   Pain Score 4    Pain Location Leg   Pain Orientation Right;Posterior;Upper;Mid   Pain Descriptors / Indicators Aching;Constant   Pain Type Acute pain                         [facility] Adult PT Treatment/Exercise - 07/19/17 1458      Knee/Hip Exercises: Stretches   Passive Hamstring Stretch Right;3 reps;30 seconds   Other Knee/Hip Stretches R hip adductor stretch - 3 x 45-60 seconds     Knee/Hip Exercises: Aerobic   Stationary Bike L3 x 5 min      Manual Therapy   Manual Therapy Soft tissue mobilization   Manual therapy comments patient prone   Soft tissue mobilization STM to HS and adductor muscle group          Trigger Point Dry Needling - 07/19/17 1716    Consent Given? Yes   Muscles Treated Lower Body Hamstring;Adductor longus/brevius/maximus   Adductor Response Twitch response elicited;Palpable increased muscle length   Hamstring Response Twitch response elicited;Palpable increased muscle length                   PT Long Term Goals - 07/19/17 1712      PT LONG TERM GOAL #1   Title patient to be independent with advanced HEP (07/10/17)   Status Achieved     PT LONG TERM GOAL #2   Title Patient to improve R HS flexibility to WNL or symmetrical of L LE without pain limiting (07/10/17)   Status Partially Met     PT LONG TERM GOAL #3   Title Patient to improve R LE strength to >/= 4+/5 wihtout pain (07/10/17)   Status Partially Met     PT LONG TERM GOAL #4  Title Patient to report ability to drive, sit, and walk (work duties) for prolonged periods without pain demonstrating improved functional mobility (07/10/17)   Status Partially Met               Plan - 07/19/17 1456    Clinical Impression Statement patient noting near complete resolution of anterior thihg pain today follwoing DN treatment, however, patient reporting recent onset of increased HS pain follwoing reduction in taking meloxicam - feels this may have bene masking pain. DN continued today at HS muscle group focusing on proximal instertion, lateral distal HS, as well as some proximal adductors with good respsonse noted. Patient currently talking with case manager due to increased pain, and may be granted increased visits depending on patient response to DN treatment. Will plan to keep patients chart open at this time for 30 days.    PT Treatment/Interventions ADLs/Self Care Home Management;Cryotherapy;Electrical Stimulation;Moist  Heat;Ultrasound;Neuromuscular re-education;Balance training;Therapeutic exercise;Iontophoresis 20m/ml Dexamethasone;Therapeutic activities;Functional mobility training;Patient/family education;Manual techniques;Passive range of motion;Vasopneumatic Device;Taping;Dry needling   Consulted and Agree with Plan of Care Patient      Patient will benefit from skilled therapeutic intervention in order to improve the following deficits and impairments:  Abnormal gait, Decreased activity tolerance, Decreased range of motion, Decreased mobility, Decreased strength, Difficulty walking, Pain  Visit Diagnosis: Pain in right leg  Other symptoms and signs involving the musculoskeletal system     Problem List Patient Active Problem List   Diagnosis Date Noted  . Chest pain 11/17/2016  . Varicose veins of lower extremities with other complications 076/28/3151    SLanney Gins PT, DPT 07/19/17 5:16 PM  PHYSICAL THERAPY DISCHARGE SUMMARY  Visits from Start of Care: 10  Current functional level related to goals / functional outcomes: See above   Remaining deficits: See above   Education / Equipment: HEP  Plan: Patient agrees to discharge.  Patient goals were partially met. Patient is being discharged due to being pleased with the current functional level.  ?????    SLanney Gins PT, DPT 09/19/17 10:13 AM   [facility] [place] [address]Phone: [phone]  Fax:  [phone] Name: Kristie SALCEMRN: [id number] of Birth: 3March 13, 1962

## 2017-08-23 MED FILL — PHENTERMINE 37.5 MG TABLET: 37.5 | 30 days supply | Qty: 30 | Fill #0

## 2017-09-19 ENCOUNTER — Telehealth: Payer: Self-pay

## 2017-09-19 NOTE — Telephone Encounter (Signed)
Pre visit call attempted. Left message for return call. 

## 2017-09-20 ENCOUNTER — Ambulatory Visit (INDEPENDENT_AMBULATORY_CARE_PROVIDER_SITE_OTHER): Payer: 59 | Admitting: Family Medicine

## 2017-09-20 ENCOUNTER — Encounter: Payer: Self-pay | Admitting: Family Medicine

## 2017-09-20 VITALS — BP 120/82 | HR 77 | Temp 98.1°F | Ht 66.0 in | Wt 194.5 lb

## 2017-09-20 DIAGNOSIS — M542 Cervicalgia: Secondary | ICD-10-CM

## 2017-09-20 DIAGNOSIS — Z1211 Encounter for screening for malignant neoplasm of colon: Secondary | ICD-10-CM | POA: Diagnosis not present

## 2017-09-20 DIAGNOSIS — K219 Gastro-esophageal reflux disease without esophagitis: Secondary | ICD-10-CM | POA: Diagnosis not present

## 2017-09-20 MED ORDER — MELOXICAM 15 MG PO TABS: 15.0000 mg | ORAL_TABLET | Freq: Every day | ORAL | 2 refills | Status: DC

## 2017-09-20 MED FILL — MELOXICAM 15 MG TABLET: 15 | 30 days supply | Qty: 30 | Fill #0

## 2017-09-20 NOTE — Patient Instructions (Signed)
If you do not hear anything about your referral in the next 1-2 weeks, call our office and ask for an update.  Try coming off of Protonix. You can try to replace with Pepcid or Zantac. If symptoms are still not controlled, we can go back on it.   Let us know if you need anything.

## 2017-09-20 NOTE — Progress Notes (Signed)
Chief Complaint  Patient presents with  . Establish Care       New Patient Visit SUBJECTIVE: HPI: Kristie Martinez is an 56 y.o.female who is being seen for establishing care.  The patient was previously seen at [facility]   In December 2017, the patient underwent a workup for atypical chest pain.  Stress testing was negative.  She was started on Protonix.  She has been on this medicine ever since.  She has never tried to come off of it.  It did help with her reflux symptoms.  The patient has a history of a slipped disc in her neck.  She had surgery done.  She will intermittently take a meloxicam for pain.   No Known Allergies  Past Medical History:  Diagnosis Date  . Indigestion   . Varicose veins    Past Surgical History:  Procedure Laterality Date  . CERVICAL DISC ARTHROPLASTY    . ENDOMETRIAL ABLATION    . ENDOVENOUS ABLATION SAPHENOUS VEIN W/ LASER  09-27-2012   left greater saphenous vein  by Gretta Began MD  . ENDOVENOUS ABLATION SAPHENOUS VEIN W/ LASER  11-08-2012   right greater saphenous vein   by Gretta Began MD  . mini gastric  2004   Social History   Social History  . Marital status: Married   Occupational History  . 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 Smoking status: Never Smoker  . Smokeless tobacco: Never Used  . Alcohol use Yes     Comment: occasional glass of wine.  . Drug use: No   Social History Narrative   Lives in [place] with husband.  Works @ [employer].  Finished PhD in 2016.  Exercises regularly.   Family History  Problem Relation Age of Onset  . CAD Mother   . Heart attack Mother        died @ 96 of MI  . Heart attack Father        died @ 65 of MI  . CAD Brother        s/p CABG x 3 @ age 34  . Diabetes Maternal Grandmother   . Diabetes Maternal Grandfather   . COPD Paternal Grandmother      Current Outpatient Prescriptions:  .  Biotin 16109 MCG TABS, Take by mouth., Disp: , Rfl:  .  calcium carbonate  (TUMS - DOSED IN MG ELEMENTAL CALCIUM) 500 MG chewable tablet, Chew 1 tablet by mouth daily., Disp: , Rfl:  .  cholecalciferol (VITAMIN D) 1000 units tablet, Take 1,000 Units by mouth daily., Disp: , Rfl:  .  Cyanocobalamin (VITAMIN B-12 PO), Take 1 tablet by mouth daily. , Disp: , Rfl:  .  diphenhydrAMINE (BENADRYL) 25 mg capsule, Take 25 mg by mouth every 6 (six) hours as needed., Disp: , Rfl:  .  docusate sodium (COLACE) 100 MG capsule, Take 100 mg by mouth 2 (two) times daily., Disp: , Rfl:  .  Flaxseed, Linseed, ([facility] FLAXSEED OIL PO), Take by mouth daily., Disp: , Rfl:  .  meloxicam (MOBIC) 15 MG tablet, Take 1 tablet (15 mg total) by mouth daily. Take 1 daily with food., Disp: 10 tablet, Rfl: 0 .  methylcellulose oral powder, Take 1 packet by mouth daily. , Disp: , Rfl:  .  Multiple Vitamin (MULTIVITAMIN WITH MINERALS) TABS tablet, Take 1 tablet by mouth daily., Disp: , Rfl:  .  pantoprazole (PROTONIX) 40 MG tablet, Take 1 tablet (40 mg  total) by mouth daily., Disp: 30 tablet, Rfl: 11 .  phentermine 37.5 MG capsule, Take 37.5 mg by mouth every morning., Disp: , Rfl:   ROS GI:  Denies reflux  Cardiac: Denies chest pain   OBJECTIVE: BP 120/82 (BP Location: Left Arm, Patient Position: Sitting, Cuff Size: Large)   Pulse 77   Temp 98.1 F (36.7 C) (Oral)   Ht 5\' 6"  (1.676 m)   Wt 194 lb 8 oz (88.2 kg)   SpO2 96%   BMI 31.39 kg/m   Constitutional: -  VS reviewed -  Well developed, well nourished, appears stated age -  No apparent distress  Psychiatric: -  Oriented to person, place, and time -  Memory intact -  Affect and mood normal -  Fluent conversation, good eye contact -  Judgment and insight age appropriate  Eye: -  Conjunctivae clear, no discharge -  Pupils symmetric, round, reactive to light  ENMT: -  MMM    Pharynx moist, no exudate, no erythema  Neck: -  No gross swelling, no palpable masses -  Thyroid midline, not enlarged, mobile, no palpable masses   Cardiovascular: -  RRR -  No LE edema  Respiratory: -  Normal respiratory effort, no accessory muscle use, no retraction -  Breath sounds equal, no wheezes, no ronchi, no crackles  Gastrointestinal: -  Bowel sounds normal -  No tenderness, no distention, no guarding, no masses  Skin: -  No significant lesion on inspection -  Warm and dry to palpation   ASSESSMENT/PLAN: Gastroesophageal reflux disease, esophagitis presence not specified  Neck pain - Plan: meloxicam (MOBIC) 15 MG tablet  Colon cancer screening - Plan: Ambulatory referral to Gastroenterology  Patient instructed to sign release of records form from her previous PCP. She sees GYN for her women's health needs and annual exams. Will check records for tetanus shot.  Patient should return prn. The patient voiced understanding and agreement to the plan.   Kristie Rocheicholas Paul [place]Wendling, DO 09/20/17  2:29 PM

## 2017-09-20 NOTE — Progress Notes (Signed)
Pre visit review using our clinic review tool, if applicable. No additional management support is needed unless otherwise documented below in the visit note. 

## 2017-10-17 MED FILL — PHENTERMINE 37.5 MG TABLET: 37.5 | 30 days supply | Qty: 30 | Fill #1

## 2017-10-20 ENCOUNTER — Encounter: Payer: Self-pay | Admitting: Family Medicine

## 2017-10-26 ENCOUNTER — Encounter: Payer: Self-pay | Admitting: Gastroenterology

## 2017-11-20 MED FILL — PANTOPRAZOLE SOD DR 40 MG T: 40 | 60 days supply | Qty: 60 | Fill #0

## 2017-11-21 HISTORY — PX: CERVICAL DISC ARTHROPLASTY: SHX587

## 2017-11-29 ENCOUNTER — Telehealth: Payer: Self-pay | Admitting: *Deleted

## 2017-11-29 NOTE — Telephone Encounter (Signed)
Called patient,left message for her to call me back today. Patient needs to stop Phentermine 10 days prior to colonoscopy scheduled on 12/15/17.

## 2017-12-01 ENCOUNTER — Telehealth: Payer: Self-pay | Admitting: *Deleted

## 2017-12-01 NOTE — Telephone Encounter (Signed)
Called patient's home number and cell.  L/M on cell for her to hold her Phentermine x 10 days prior to her colon on 1/25.  Her PV is 1/16.  Asked patient to call us at [phone] to let us know she received the message.  We will clarify with her on 1/16 at her PV.  B.Benitez, CMA PV

## 2017-12-05 MED FILL — PHENTERMINE 37.5 MG TABLET: 37.5 | 30 days supply | Qty: 30 | Fill #0

## 2017-12-06 ENCOUNTER — Ambulatory Visit (AMBULATORY_SURGERY_CENTER): Payer: Self-pay | Admitting: *Deleted

## 2017-12-06 ENCOUNTER — Other Ambulatory Visit: Payer: Self-pay

## 2017-12-06 VITALS — Ht 66.0 in | Wt 200.0 lb

## 2017-12-06 DIAGNOSIS — Z1211 Encounter for screening for malignant neoplasm of colon: Secondary | ICD-10-CM

## 2017-12-06 MED ORDER — NA SULFATE-K SULFATE-MG SULF 17.5-3.13-1.6 GM/177ML PO SOLN: ORAL | 0 refills | Status: DC

## 2017-12-06 MED FILL — SUPREP BOWEL PREP KIT: 17.5-3.13-1 | 1 days supply | Qty: 354 | Fill #0

## 2017-12-06 NOTE — Progress Notes (Signed)
Patient denies any allergies to eggs or soy. Patient denies any problems with anesthesia/sedation. Patient denies any oxygen use at home. Patient denies taking any blood thinners. Patient states she stopped Phentermine 3 days ago, explained to patient to stay off this until after colon exam. EMMI education assisgned to patient on colonoscopy, this was explained and instructions given to patient.

## 2017-12-07 ENCOUNTER — Encounter: Payer: Self-pay | Admitting: Gastroenterology

## 2017-12-15 ENCOUNTER — Encounter: Payer: Self-pay | Admitting: Gastroenterology

## 2017-12-15 ENCOUNTER — Ambulatory Visit (AMBULATORY_SURGERY_CENTER): Payer: 59 | Admitting: Gastroenterology

## 2017-12-15 ENCOUNTER — Other Ambulatory Visit: Payer: Self-pay

## 2017-12-15 VITALS — BP 118/75 | HR 72 | Temp 98.2°F | Resp 13 | Ht 66.0 in | Wt 200.0 lb

## 2017-12-15 DIAGNOSIS — Z1211 Encounter for screening for malignant neoplasm of colon: Secondary | ICD-10-CM | POA: Diagnosis present

## 2017-12-15 DIAGNOSIS — K5730 Diverticulosis of large intestine without perforation or abscess without bleeding: Secondary | ICD-10-CM

## 2017-12-15 MED ORDER — SODIUM CHLORIDE 0.9 % IV SOLN: 500.0000 mL | Freq: Once | INTRAVENOUS | Status: DC

## 2017-12-15 NOTE — Op Note (Signed)
[facility] Patient Name: Kristie Martinez Procedure Date: 12/15/2017 2:31 PM MRN: [id number] Endoscopist: Rachael Fee , MD Age: 57 Referring MD:  Date of Birth: 1961/06/26 Gender: Female Account #: [id number] Procedure:                Colonoscopy Indications:              Screening for colorectal malignant neoplasm Medicines:                Monitored Anesthesia Care Procedure:                Pre-Anesthesia Assessment:                           - Prior to the procedure, a History and Physical                            was performed, and patient medications and                            allergies were reviewed. The patient's tolerance of                            previous anesthesia was also reviewed. The risks                            and benefits of the procedure and the sedation                            options and risks were discussed with the patient.                            All questions were answered, and informed consent                            was obtained. Prior Anticoagulants: The patient has                            taken no previous anticoagulant or antiplatelet                            agents. ASA Grade Assessment: II - A patient with                            mild systemic disease. After reviewing the risks                            and benefits, the patient was deemed in                            satisfactory condition to undergo the procedure.                           After obtaining informed consent, the colonoscope  was passed under direct vision. Throughout the                            procedure, the patient's blood pressure, pulse, and                            oxygen saturations were monitored continuously. The                            Colonoscope was introduced through the anus and                            advanced to the the cecum, identified by                            appendiceal orifice and  ileocecal valve. The                            colonoscopy was performed without difficulty. The                            patient tolerated the procedure well. The quality                            of the bowel preparation was adequate. The                            ileocecal valve, appendiceal orifice, and rectum                            were photographed. Scope In: 2:40:42 PM Scope Out: 2:58:25 PM Scope Withdrawal Time: 0 hours 9 minutes 45 seconds  Total Procedure Duration: 0 hours 17 minutes 43 seconds  Findings:                 Multiple small-mouthed diverticula were found in                            the left colon.                           Internal hemorrhoids were found. The hemorrhoids                            were small.                           The exam was otherwise without abnormality on                            direct and retroflexion views. Complications:            No immediate complications. Estimated blood loss:                            None. Estimated Blood Loss:     Estimated blood  loss: none. Impression:               - Diverticulosis in the left colon.                           - Internal hemorrhoids.                           - The examination was otherwise normal on direct                            and retroflexion views.                           - No polyps or cancers. Recommendation:           - Patient has a contact number available for                            emergencies. The signs and symptoms of potential                            delayed complications were discussed with the                            patient. Return to normal activities tomorrow.                            Written discharge instructions were provided to the                            patient.                           - Resume previous diet.                           - Continue present medications.                           - Repeat colonoscopy in 10 years for  screening. Rachael Fee, MD 12/15/2017 3:01:12 PM This report has been signed electronically.

## 2017-12-15 NOTE — Progress Notes (Signed)
To PACU, VSS. Report to RN.tb 

## 2017-12-15 NOTE — Patient Instructions (Signed)
**  Handouts given on polyps and hemorrhoids**   YOU HAD AN ENDOSCOPIC PROCEDURE TODAY: Refer to the procedure report and other information in the discharge instructions given to you for any specific questions about what was found during the examination. If this information does not answer your questions, please call [place] office at [phone] to clarify.   YOU SHOULD EXPECT: Some feelings of bloating in the abdomen. Passage of more gas than usual. Walking can help get rid of the air that was put into your GI tract during the procedure and reduce the bloating. If you had a lower endoscopy (such as a colonoscopy or flexible sigmoidoscopy) you may notice spotting of blood in your stool or on the toilet paper. Some abdominal soreness may be present for a day or two, also.  DIET: Your first meal following the procedure should be a light meal and then it is ok to progress to your normal diet. A half-sandwich or bowl of soup is an example of a good first meal. Heavy or fried foods are harder to digest and may make you feel nauseous or bloated. Drink plenty of fluids but you should avoid alcoholic beverages for 24 hours. If you had a esophageal dilation, please see attached instructions for diet.    ACTIVITY: Your care partner should take you home directly after the procedure. You should plan to take it easy, moving slowly for the rest of the day. You can resume normal activity the day after the procedure however YOU SHOULD NOT DRIVE, use power tools, machinery or perform tasks that involve climbing or major physical exertion for 24 hours (because of the sedation medicines used during the test).   SYMPTOMS TO REPORT IMMEDIATELY: A gastroenterologist can be reached at any hour. Please call [phone]  for any of the following symptoms:  Following lower endoscopy (colonoscopy, flexible sigmoidoscopy) Excessive amounts of blood in the stool  Significant tenderness, worsening of abdominal pains  Swelling of  the abdomen that is new, acute  Fever of 100 or higher    FOLLOW UP:  If any biopsies were taken you will be contacted by phone or by letter within the next 1-3 weeks. Call [phone]  if you have not heard about the biopsies in 3 weeks.  Please also call with any specific questions about appointments or follow up tests.  

## 2017-12-18 ENCOUNTER — Telehealth: Payer: Self-pay

## 2017-12-18 NOTE — Telephone Encounter (Signed)
  Follow up Call-  Call back number 12/15/2017  Post procedure Call Back phone  # [phone]  Permission to leave phone message Yes  Some recent data might be hidden     Patient questions:  Do you have a fever, pain , or abdominal swelling? No. Pain Score  0 *  Have you tolerated food without any problems? Yes.    Have you been able to return to your normal activities? Yes.    Do you have any questions about your discharge instructions: Diet   No. Medications  No. Follow up visit  No.  Do you have questions or concerns about your Care? No.  Actions: * If pain score is 4 or above: No action needed, pain <4.

## 2018-01-16 MED FILL — PHENTERMINE 37.5 MG TABLET: 37.5 | 30 days supply | Qty: 30 | Fill #1

## 2018-02-23 DIAGNOSIS — Z713 Dietary counseling and surveillance: Secondary | ICD-10-CM | POA: Diagnosis not present

## 2018-03-05 MED FILL — PHENTERMINE 37.5 MG TABLET: 37.5 | 30 days supply | Qty: 30 | Fill #0

## 2018-03-12 ENCOUNTER — Other Ambulatory Visit: Payer: Self-pay | Admitting: *Deleted

## 2018-04-26 MED FILL — PHENTERMINE 37.5 MG TABLET: 37.5 | 30 days supply | Qty: 30 | Fill #1

## 2018-05-10 ENCOUNTER — Encounter: Payer: Self-pay | Admitting: Registered"

## 2018-05-10 ENCOUNTER — Encounter: Payer: 59 | Attending: Family Medicine | Admitting: Registered"

## 2018-05-10 DIAGNOSIS — E639 Nutritional deficiency, unspecified: Secondary | ICD-10-CM

## 2018-05-10 DIAGNOSIS — Z713 Dietary counseling and surveillance: Secondary | ICD-10-CM | POA: Insufficient documentation

## 2018-05-10 NOTE — Patient Instructions (Addendum)
Instructions/Goals:  Make sure to get in three meals per day. Try to have balanced meals like the My Plate example (see handout). Try to include more vegetables, fruits, and whole grains at meals. Goal: Include more non-starchy vegetables and whole grains at lunch/dinner. (see handout)    Practice Mindful Eating  If you feel that you are wanting to snack because your are bored or due to emotions and not because you are hungry, try to do a fun activity (read a book, take a walk, talk with a friend, etc.) for at least 20 minutes.   If doing work at a certain location makes you want to snack, try to work at different location away from food/kitchen.   Make physical activity a part of your week. Try to include at least 30 minutes of physical activity 5 days each week or at least 150 minutes per week. Regular physical activity promotes overall health-including helping to reduce risk for heart disease and diabetes, promoting mental health, and helping us sleep better.   Starting Goal: Include at least 30 minutes of activity 4 days per week.

## 2018-05-10 NOTE — Progress Notes (Signed)
Medical Nutrition Therapy:  Appt start time: 0800 end time:  0915.   Assessment:  Primary concerns today: [employer] Employee Visit #1. Pt reports that she has always struggled with her weight and reports that she is at a higher weight now than is typical for her. She reports she is a stress eater. Pt reports having a very busy schedule. Pt reports that when not at work she is often reading or writing. 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 She reports that she will want to snack while writing at night. Pt usually does her writing at the dining room table. Pt reports she has followed low carbohydrate diets in the past and lost weight initially, but gained the weight back after stopping the diet. She reports she tries to limit grains in her diet. She does include starchy vegetables and fruits. Reports she includes regular activity which includes walking, the elliptical or swimming for 20-30 minutes 2-3 times per week. Pt reports that milk, plain water, and sweets late at night give her indigestion. She reports that she does not usually have problems with indigestion if she avoids eating/drinking these foods, especially at night. Reports she will take a Pepcid if she does have them. Pt reports that she does not like cold beverages.   Preferred Learning Style:  No preference indicated   Learning Readiness:   Ready  MEDICATIONS: See list.    DIETARY INTAKE:  Usual eating pattern includes 2-3 meals and 1 snack per day. Has a snack every night. Reports liking salty foods. Reports eating popcorn about every night. Pt reports breakfast is usually [place] yogurt and fruit OR eggs OR peanut butter toast and she eats it on the way to work. Meals eaten at home are usually eaten at the dining room table. Sometimes in living room. Electronics are not present during mealtimes.    Everyday foods include popcorn, banana, reports she eats a lot of chicken.   Avoided foods include soups, baked beans.    24-hr recall:  B ( AM): eggs OR [place] yogurt and fruit OR peanut butter toast, coffee with articificial sweetener and creamer Snk ( AM): None reported.  L ( PM): vegetables (broccoli, asparagus, mixed vegetables) and may get fish or rotisserie chicken, Diet Mountain Dew Snk ( PM): None reported.  D ( PM): stir fry with chicken and vegetables OR chicken with a salad OR grilled chicken with corn on the cob and green beans, Diet Mountain Dew  Snk ( PM): typically popcorn Beverages: Diet Mountain Dew usually 4-5 x 12 oz cans per day, ~3 cups coffee per day.    Usual physical activity: Apart from walking her dog daily, pt reports walking, using elliptical, or swimming for 20-30 minutes 2-3 times per week.  Progress Towards Goal(s):  In progress.   Nutritional Diagnosis:  NI-5.11.1 Predicted suboptimal nutrient intake As related to diet low in whole grains and plain water.  As evidenced by pt's reported dietary recall and habits .    Intervention:  Nutrition counseling provided. Provided education regarding balanced nutrition. Discussed ineffectiveness of restrictive diets for promoting long-term health. Discussed different types of whole grains and how to incorporate them into meals pt likes to prepare. Discussed mindful eating. Worked with pt to set goals for next appointment. Pt appeared agreeable to information/goals discussed.   Instructions/Goals:  Make sure to get in three meals per day. Try to have balanced meals like the My Plate example (see  handout). Try to include more vegetables, fruits, and whole grains at meals. Goal: Include more non-starchy vegetables and whole grains at lunch/dinner. (see handout)    Practice Mindful Eating  If you feel that you are wanting to snack because your are bored or due to emotions and not because you are hungry, try to do a fun activity (read a book, take a walk, talk with a friend, etc.) for at least 20  minutes.   If doing work at a certain location makes you want to snack, try to work at different location away from food/kitchen.   Make physical activity a part of your week. Try to include at least 30 minutes of physical activity 5 days each week or at least 150 minutes per week. Regular physical activity promotes overall health-including helping to reduce risk for heart disease and diabetes, promoting mental health, and helping us sleep better.   Starting Goal: Include at least 30 minutes of activity 4 days per week.  Teaching Method Utilized:  Visual Auditory  Handouts given during visit include:  Balanced plate with food list.   Healthy Snacks   Barriers to learning/adherence to lifestyle change: None indicated.   Demonstrated degree of understanding via:  Teach Back   Monitoring/Evaluation:  Dietary intake, exercise, and body weight in 2 week(s). Pt requested to come back for a second appointment before she goes out of town next month.

## 2018-05-21 ENCOUNTER — Encounter: Payer: Self-pay | Admitting: Family Medicine

## 2018-05-21 ENCOUNTER — Ambulatory Visit: Payer: 59 | Admitting: Family Medicine

## 2018-05-21 ENCOUNTER — Ambulatory Visit: Payer: Self-pay | Admitting: Registered"

## 2018-05-21 VITALS — BP 108/70 | HR 88 | Temp 98.2°F | Ht 66.0 in | Wt 207.4 lb

## 2018-05-21 DIAGNOSIS — G4700 Insomnia, unspecified: Secondary | ICD-10-CM | POA: Diagnosis not present

## 2018-05-21 DIAGNOSIS — R232 Flushing: Secondary | ICD-10-CM | POA: Diagnosis not present

## 2018-05-21 MED ORDER — TRAZODONE HCL 50 MG PO TABS: 25.0000 mg | ORAL_TABLET | Freq: Every evening | ORAL | 3 refills | Status: DC | PRN

## 2018-05-21 MED ORDER — VENLAFAXINE HCL ER 37.5 MG PO TB24: 37.5000 mg | ORAL_TABLET | Freq: Every day | ORAL | 2 refills | Status: DC

## 2018-05-21 MED FILL — traZODone HCL 50 MG TABS: 50 | 30 days supply | Qty: 30 | Fill #0

## 2018-05-21 MED FILL — VENLAFAXINE HCL ER 37.5 MG: 37.5 | 30 days supply | Qty: 30 | Fill #0 | Status: TO

## 2018-05-21 NOTE — Progress Notes (Signed)
Pre visit review using our clinic review tool, if applicable. No additional management support is needed unless otherwise documented below in the visit note. 

## 2018-05-21 NOTE — Progress Notes (Signed)
Chief Complaint  Patient presents with  . Hot Flashes  . Headache  . Weight Gain    Subjective: Patient is a 57 y.o. female here for hot flashes.  This has been going on since menopause got worse earlier in the year. She used to take Flaxseed, but this was no longer helpful. She is not interested in hormone replacement. She has been using fans, dressing appropriately and cranking [facility] the [facility].   Has been having issues sleeping both because of hot flashes, but also because of racing thoughts. She is very busy and type A with her work. Has tried and failed melatonin and Benadryl.   ROS: Endo: As noted in HPI Psych: +insomnia  Past Medical History:  Diagnosis Date  . Indigestion   . Post-operative nausea and vomiting   . Varicose veins     Objective: BP 108/70 (BP Location: Left Arm, Patient Position: Sitting, Cuff Size: Large)   Pulse 88   Temp 98.2 F (36.8 C) (Oral)   Ht 5\' 6"  (1.676 m)   Wt 207 lb 6 oz (94.1 kg)   SpO2 97%   BMI 33.47 kg/m  General: Awake, appears stated age HEENT: MMM, EOMi Heart: RRR Neck: No asymmetry or masses noted Lungs: CTAB, no rales, wheezes or rhonchi. No accessory muscle use Psych: Age appropriate judgment and insight, normal affect and mood  Assessment and Plan: Hot flashes - Plan: TSH, Venlafaxine HCl 37.5 MG TB24  Insomnia, unspecified type - Plan: traZODone (DESYREL) 50 MG tablet  Orders as above. Start SNRI for hot flashes. Soy/Black Cohosh suggested.  Sleep hygiene, CBT referral info, Trazodone prn. Maybe can control should the hot flashes resolve.  F/u in 6 weeks or prn. The patient voiced understanding and agreement to the plan.  Jilda Rocheicholas Paul [place], DO 05/21/18  4:37 PM

## 2018-05-21 NOTE — Patient Instructions (Addendum)
Soy products, Black Cohosh can be helpful.   Let us know if you need anything.  Sleep Hygiene Tips:  Do not watch TV or look at screens within 1 hour of going to bed. If you do, make sure there is a blue light filter (nighttime mode) involved.  Try to go to bed around the same time every night. Wake up at the same time within 1 hour of regular time. Ex: If you wake up at 7 AM for work, do not sleep past 8 AM on days that you don't work.  Do not drink alcohol before bedtime.  Do not consume caffeine-containing beverages after noon or within 9 hours of intended bedtime.  Get regular exercise/physical activity in your life, but not within 2 hours of planned bedtime.  Do not take naps.   Do not eat within 2 hours of planned bedtime.  Melatonin, 3-5 mg 30-60 minutes before planned bedtime may be helpful.   The bed should be for sleep or sex only. If after 20-30 minutes you are unable to fall asleep, get up and do something relaxing. Do this until you feel ready to go to sleep again.   Sleep is important to us all. Getting good sleep is imperative to adequate functioning during the day. Work with our counselors who are trained to help people obtain quality sleep. Call [phone] to schedule an appointment or if you are curious about insurance coverage/cost.

## 2018-05-22 LAB — TSH: TSH: 2.92 u[IU]/mL (ref 0.35–4.50)

## 2018-05-28 ENCOUNTER — Ambulatory Visit: Payer: Self-pay | Admitting: Family Medicine

## 2018-06-15 MED FILL — VENLAFAXINE HCL ER 37.5 MG: 37.5 | 30 days supply | Qty: 30 | Fill #0

## 2018-06-20 DIAGNOSIS — Z713 Dietary counseling and surveillance: Secondary | ICD-10-CM | POA: Diagnosis not present

## 2018-06-20 DIAGNOSIS — N951 Menopausal and female climacteric states: Secondary | ICD-10-CM | POA: Diagnosis not present

## 2018-06-21 MED FILL — PHENTERMINE 37.5 MG TABLET: 37.5 | 30 days supply | Qty: 30 | Fill #0

## 2018-06-28 ENCOUNTER — Encounter: Payer: 59 | Attending: Family Medicine | Admitting: Registered"

## 2018-06-28 ENCOUNTER — Encounter: Payer: Self-pay | Admitting: Registered"

## 2018-06-28 DIAGNOSIS — E639 Nutritional deficiency, unspecified: Secondary | ICD-10-CM

## 2018-06-28 DIAGNOSIS — Z713 Dietary counseling and surveillance: Secondary | ICD-10-CM | POA: Insufficient documentation

## 2018-06-28 NOTE — Patient Instructions (Addendum)
Instructions/Goals:  Make sure to get in three meals per day. Try to have balanced meals like the My Plate example (see handout). Try to include vegetables, fruits, and whole grains at meals.  Goal: Continue working to include more whole grains (whole grain pasta, bread, rice, quinoa, etc) at meals along with non-starchy vegetables.    Include more water and less soda-recommend trying carbonated fruit essenced water-[place], [place]ubbly, and Dasani include some common brands   Practice Mindful Eating  Recommend including good sources of protein and fiber with snacks to provide satiety.   Make physical activity a part of your week. Try to include at least 30 minutes of physical activity 5 days each week or at least 150 minutes per week. Regular physical activity promotes overall health-including helping to reduce risk for heart disease and diabetes, promoting mental health, and helping us sleep better.   Goal: Continue including at least 20 minutes of physical activity 6 days per week.  Great job increasing activity each week!

## 2018-06-28 NOTE — Progress Notes (Signed)
Medical Nutrition Therapy:  Appt start time: 1530 end time:  1623.   Assessment:  Primary concerns today: [employer] Employee Visit #2. Nutrition Follow-Up: Pt reports she has been walking more since last visit. Pt reports she has been walking at least 20 minutes 6 days a week. Pt also reports that while in [place] last month she drank a lot more water. Reports that she liked a lemon/lime drink she found there but has not been able to find a similar product here. Pt reports that she has been trying to limit her carbohydrates, specifically grains, because she wants to lose weight. Pt reports that she did try some more whole grains as discussed at last appointment. Pt reports that she tried whole grain pasta and quinoa and liked them both. Pt reports that she has been having hot flashes. Pt reports that she was prescribed Effexor and reports that since starting the medication she has been feeling hungry after going to bed at night if she does not have an evening snack. Pt reports she often will eat a banana and that will satisfy her hunger/craving. Pt reports she usually eats dinner between 630-8 pm and bedtime varies. Pt reports that she often gets only 4-6 hours of sleep per night and usually wakes up at least one time during the night. Pt reports that this sleep pattern has been consistent for the past several years. Pt reports that while traveling in [place] her leg which she previously had injured started bothering her again when walking on uneven ground. She plans to discuss this with her doctor. Pt will be coming back next week to complete her 3 visits before she leaves for [place] later this month for business travel. Pt reports she may want to continue coming for visits after her 3 visit requirement is fulfilled.   Preferred Learning Style:  No preference indicated   Learning Readiness:   Ready  MEDICATIONS: See list.    DIETARY INTAKE:  Usual eating pattern includes 2-3 meals and 1 snack per day. Has  a snack every night. Reports liking salty foods. Reports eating popcorn about every night. Pt reports breakfast is usually [place] yogurt and fruit OR eggs OR peanut butter toast and she eats it on the way to work. Meals eaten at home are usually eaten at the dining room table. Sometimes in living room. Electronics are not present during mealtimes.    Everyday foods include popcorn, banana, reports she eats a lot of chicken.  Avoided foods include soups, baked beans.    24-hr recall:  B ( AM): banana, coffee  Snk ( AM): None reported.  L ( PM): southwestern salad  Snk ( PM): None reported.  D ( PM): peppers, onions, olive oil, baked potato, grilled chicken   Snk ( PM): banana Beverages: Diet Mt Dew x3, Diet Pepsi, Unsweet tea, flavored water  Usual physical activity: Pt reports she has increased her walking to 6 days per week for at least 20 minutes each time.    Progress Towards Goal(s):  Some progress.   Nutritional Diagnosis:  NI-5.11.1 Predicted suboptimal nutrient intake As related to diet low in whole grains and plain water.  As evidenced by pt's reported dietary recall and habits .    Intervention:  Nutrition counseling provided. Dietitian praised pt's progress with getting in more physical activity and trying more whole grains. Dietitian discussed the need for a balanced amount of carbohydrates at each meal and discussed including whole grains as they are more filling/satisfying than  refined grains. Encouraged pt to try unsweetened flavored waters such as [employer] as they have lemon and lime flavors to try in place of soda. Discussed filling snacks (those with good source of protein and fiber). Pt reports she tries to include protein in snacks. Pt appeared agreeable to information/goals discussed.   Instructions/Goals:  Make sure to get in three meals per day. Try to have balanced meals like the My Plate example (see handout). Try to include vegetables, fruits, and whole grains at  meals.  Goal: Continue working to include more whole grains (whole grain pasta, bread, rice, quinoa, etc) at meals along with non-starchy vegetables.    Include more water and less soda-recommend trying carbonated fruit essenced water-[place], [place]ubbly, and Dasani include some common brands   Practice Mindful Eating  Recommend including good sources of protein and fiber with snacks to provide satiety.   Make physical activity a part of your week. Try to include at least 30 minutes of physical activity 5 days each week or at least 150 minutes per week. Regular physical activity promotes overall health-including helping to reduce risk for heart disease and diabetes, promoting mental health, and helping us sleep better.   Goal: Continue including at least 20 minutes of physical activity 6 days per week.  Great job increasing activity each week!   Teaching Method Utilized:  Visual Auditory  Barriers to learning/adherence to lifestyle change: None indicated.   Demonstrated degree of understanding via:  Teach Back   Monitoring/Evaluation:  Dietary intake, exercise, and body weight in 4 day(s).

## 2018-07-02 ENCOUNTER — Ambulatory Visit: Payer: Self-pay | Admitting: Registered"

## 2018-07-02 ENCOUNTER — Encounter: Payer: 59 | Admitting: Skilled Nursing Facility1

## 2018-07-02 ENCOUNTER — Encounter: Payer: Self-pay | Admitting: Skilled Nursing Facility1

## 2018-07-02 DIAGNOSIS — E639 Nutritional deficiency, unspecified: Secondary | ICD-10-CM

## 2018-07-02 NOTE — Progress Notes (Signed)
Medical Nutrition Therapy:  Appt start time: 1530 end time:  1623.   Assessment:  Primary concerns today: [employer] Employee Visit #3. Nutrition Follow-Up:  Pt was not convinced adding in complex carbohydrates was the right route for her and believed a low carb diet is the only way for her to lose weight. Dietitian educated the pt on the importance of understanding her relationship with food, stress' role in that relationship, and why carbohydrates are neccessary for he body. Dietitian also educated the pt on the difference between quickly losing weight and maintaining  health and weight loss. Pt was open to this educating and realizes her stress level and reacting to stress with the use of food has to change if she is going to have success with weight loss.  Dietitian advised pt consume 30 grams of complex carbohydrate per meal at minimum.    MEDICATIONS: See list.    DIETARY INTAKE:  Usual eating pattern includes 2-3 meals and 1 snack per day. Has a snack every night. Reports liking salty foods. Reports eating popcorn about every night. Pt reports breakfast is usually [place] yogurt and fruit OR eggs OR peanut butter toast and she eats it on the way to work. Meals eaten at home are usually eaten at the dining room table. Sometimes in living room. Electronics are not present during mealtimes.    Everyday foods include popcorn, banana, reports she eats a lot of chicken.  Avoided foods include soups, baked beans.    24-hr recall:  B ( AM): banana, coffee  Snk ( AM): None reported.  L ( PM): southwestern salad  Snk ( PM): None reported.  D ( PM): peppers, onions, olive oil, baked potato, grilled chicken   Snk ( PM): banana Beverages: Diet Mt Dew x3, Diet Pepsi, Unsweet tea, flavored water  Usual physical activity: Pt reports she has increased her walking to 6 days per week for at least 20 minutes each time.    Progress Towards Goal(s):  Some progress.   Nutritional Diagnosis:  NI-5.11.1  Predicted suboptimal nutrient intake As related to diet low in whole grains and plain water.  As evidenced by pt's reported dietary recall and habits .    Intervention:  Nutrition counseling provided. Dietitian praised pt's progress with getting in more physical activity and trying more whole grains. Dietitian discussed the need for a balanced amount of carbohydrates at each meal and discussed including whole grains as they are more filling/satisfying than refined grains. Encouraged pt to try unsweetened flavored waters such as [employer] as they have lemon and lime flavors to try in place of soda. Discussed filling snacks (those with good source of protein and fiber). Pt reports she tries to include protein in snacks. Pt appeared agreeable to information/goals discussed.   Instructions/Goals:  Make sure to get in three meals per day. Try to have balanced meals like the My Plate example (see handout). Try to include vegetables, fruits, and whole grains at meals.  Goal: Continue working to include more whole grains (whole grain pasta, bread, rice, quinoa, etc) at meals along with non-starchy vegetables.    Include more water and less soda-recommend trying carbonated fruit essenced water-[place], [place], and Dasani include some common brands   Practice Mindful Eating  Recommend including good sources of protein and fiber with snacks to provide satiety.   Make physical activity a part of your week. Try to include at least 30 minutes of physical activity 5 days each week or at least  150 minutes per week. Regular physical activity promotes overall health-including helping to reduce risk for heart disease and diabetes, promoting mental health, and helping us sleep better.   Goal: Continue including at least 20 minutes of physical activity 6 days per week.  Great job increasing activity each week!   Teaching Method Utilized:  Visual Auditory  Barriers to learning/adherence to lifestyle change:  None indicated.   Demonstrated degree of understanding via:  Teach Back   Monitoring/Evaluation:  Dietary intake, exercise, and body weight in 4 day(s).

## 2018-07-13 MED FILL — VENLAFAXINE HCL ER 37.5 MG: 37.5 | 30 days supply | Qty: 30 | Fill #0

## 2018-07-24 ENCOUNTER — Ambulatory Visit (HOSPITAL_COMMUNITY)
Admission: EM | Admit: 2018-07-24 | Discharge: 2018-07-24 | Disposition: A | Discharge status: Home / Self Care | Payer: 59 | Attending: Family Medicine | Admitting: Family Medicine

## 2018-07-24 ENCOUNTER — Encounter (HOSPITAL_COMMUNITY): Payer: Self-pay

## 2018-07-24 ENCOUNTER — Other Ambulatory Visit: Payer: Self-pay

## 2018-07-24 DIAGNOSIS — L551 Sunburn of second degree: Secondary | ICD-10-CM

## 2018-07-24 DIAGNOSIS — R110 Nausea: Secondary | ICD-10-CM | POA: Diagnosis not present

## 2018-07-24 DIAGNOSIS — R51 Headache: Secondary | ICD-10-CM | POA: Diagnosis not present

## 2018-07-24 MED ORDER — PREDNISONE 10 MG PO TABS: 20.0000 mg | ORAL_TABLET | Freq: Every day | ORAL | 0 refills | Status: DC

## 2018-07-24 MED FILL — predniSONE 10 MG TABS: 10 | 6 days supply | Qty: 15 | Fill #0

## 2018-07-24 NOTE — ED Provider Notes (Signed)
[facility]    CSN: [id number] Arrival date & time: 07/24/18  7829     History   Chief Complaint Chief Complaint  Patient presents with  . sun burn    HPI Kristie Martinez is a 57 y.o. female.   Patient [place] on the beach [place] ago and had a fairly severe sunburn since then she has been having nausea headaches and itching primarily on her upper extremities.  She is using various OTC creams such as calamine and taking oral Benadryl.  HPI  Past Medical History:  Diagnosis Date  . Indigestion   . Post-operative nausea and vomiting   . Varicose veins     Patient Active Problem List   Diagnosis Date Noted  . Hot flashes 05/21/2018  . Chest pain 11/17/2016  . Status post gastric bypass for obesity 03/07/2013  . Varicose veins of lower extremities with other complications 05/17/2012    Past Surgical History:  Procedure Laterality Date  . CERVICAL DISC ARTHROPLASTY    . ENDOMETRIAL ABLATION    . ENDOVENOUS ABLATION SAPHENOUS VEIN W/ LASER  09-27-2012   left greater saphenous vein  by Gretta Began MD  . ENDOVENOUS ABLATION SAPHENOUS VEIN W/ LASER  11-08-2012   right greater saphenous vein   by Gretta Began MD  . mini gastric bypass   2004  . OTHER SURGICAL HISTORY  2004   mini gastric bypass  . TONSILLECTOMY    . TONSILLECTOMY AND ADENOIDECTOMY  1970    OB History   None      Home Medications    Prior to Admission medications   Medication Sig Start Date End Date Taking? Authorizing Provider  Biotin 56213 MCG TABS Take by mouth.    [provider]  calcium carbonate (TUMS - DOSED IN MG ELEMENTAL CALCIUM) 500 MG chewable tablet Chew 1 tablet by mouth daily.    [provider]  cholecalciferol (VITAMIN D) 1000 units tablet Take 800 Units by mouth daily.     [provider]  Cyanocobalamin (VITAMIN B-12 PO) Take 2,500 tablets by mouth daily.     [provider]  diphenhydrAMINE (BENADRYL) 25 mg capsule Take 25 mg  by mouth every 6 (six) hours as needed.    [provider]  docusate sodium (COLACE) 100 MG capsule Take 100 mg by mouth 2 (two) times daily.    [provider]  famotidine (PEPCID) 20 MG tablet Take 20 mg by mouth as needed for heartburn or indigestion.    [provider]  Flaxseed, Linseed, ([facility] FLAXSEED OIL PO) Take 1,200 mg by mouth daily.     [provider]  meloxicam (MOBIC) 15 MG tablet Take 1 tablet (15 mg total) by mouth daily. 09/20/17   Sharlene Dory, DO  methylcellulose oral powder Take 1 packet by mouth daily.     [provider]  Multiple Vitamin (MULTIVITAMIN WITH MINERALS) TABS tablet Take 1 tablet by mouth daily.    [provider]  phentermine 37.5 MG capsule Take 37.5 mg by mouth every morning.    [provider]  traZODone (DESYREL) 50 MG tablet Take 0.5-1 tablets (25-50 mg total) by mouth at bedtime as needed for sleep. 05/21/18   Sharlene Dory, DO  Venlafaxine HCl 37.5 MG TB24 Take 1 tablet (37.5 mg total) by mouth daily. 05/21/18   Sharlene Dory, DO    Family History Family History  Problem Relation Age of Onset  . CAD Mother   .  Heart attack Mother        died @ 25 of MI  . Hypertension Mother   . Hyperlipidemia Mother   . Heart attack Father        died @ 3 of MI  . Hypertension Father   . CAD Brother        s/p CABG x 3 @ age 29  . Hypertension Brother   . Hyperlipidemia Brother   . Heart attack Brother   . Diabetes Maternal Grandmother   . Diabetes Maternal Grandfather   . COPD Paternal Grandmother   . Hypertension Brother   . Colon cancer Neg Hx     Social History Social History   Tobacco Use  . Smoking status: Never Smoker  . Smokeless tobacco: Never Used  Substance Use Topics  . Alcohol use: Yes    Comment: occasional glass of wine.  . Drug use: No     Allergies   Patient has no known allergies.   Review of Systems Review of Systems    Constitutional: Negative.   Gastrointestinal: Positive for nausea.  Skin: Positive for rash.     Physical Exam Triage Vital Signs ED Triage Vitals  Enc Vitals Group     BP --      Pulse Rate 07/24/18 0939 74     Resp 07/24/18 0939 18     Temp 07/24/18 0939 98 F (36.7 C)     Temp Source 07/24/18 0939 Oral     SpO2 07/24/18 0939 98 %     Weight 07/24/18 0940 195 lb (88.5 kg)     Height --      Head Circumference --      Peak Flow --      Pain Score --      Pain Loc --      Pain Edu? --      Excl. in GC? --    No data found.  Updated Vital Signs Pulse 74   Temp 98 F (36.7 C) (Oral)   Resp 18   Wt 88.5 kg   SpO2 98%   BMI 31.71 kg/m   Visual Acuity Right Eye Distance:   Left Eye Distance:   Bilateral Distance:    Right Eye Near:   Left Eye Near:    Bilateral Near:     Physical Exam  Constitutional: She appears well-developed and well-nourished.  Cardiovascular: Normal rate.  Pulmonary/Chest: Effort normal and breath sounds normal.  Skin: Skin is warm. Rash noted.  Some depigmentation of the left upper arm     UC Treatments / Results  Labs (all labs ordered are listed, but only abnormal results are displayed) Labs Reviewed - No data to display  EKG None  Radiology No results found.  Procedures Procedures (including critical care time)  Medications Ordered in UC Medications - No data to display  Initial Impression / Assessment and Plan / UC Course  I have reviewed the triage vital signs and the nursing notes.  Pertinent labs & imaging results that were available during my care of the patient were reviewed by me and considered in my medical decision making (see chart for details).     Sunburn with sequelae.  Will treat with oral prednisone and taper dose over 1 week.  Stop all creams but may use aloe Final Clinical Impressions(s) / UC Diagnoses   Final diagnoses:  None   Discharge Instructions   None    ED Prescriptions    None  Controlled Substance Prescriptions [place] Controlled Substance Registry consulted? No   Frederica Kuster, MD 07/24/18 1007

## 2018-07-24 NOTE — ED Triage Notes (Signed)
Pt states she [place] asleep on the beach and got sun poison. C/o nausea , headaches, itching. (both arms)

## 2018-07-25 MED FILL — MELOXICAM 15 MG TABLET: 15 | 30 days supply | Qty: 30 | Fill #1

## 2018-07-25 MED FILL — NORETHIND-ETH ESTRAD 0.5-2.: 0.5-2.5 | 84 days supply | Qty: 84 | Fill #0

## 2018-08-06 ENCOUNTER — Other Ambulatory Visit: Payer: Self-pay | Admitting: Family Medicine

## 2018-08-06 MED FILL — VENLAFAXINE HCL ER 37.5 MG: 37.5 | 30 days supply | Qty: 30 | Fill #0

## 2018-08-07 DIAGNOSIS — H5213 Myopia, bilateral: Secondary | ICD-10-CM | POA: Diagnosis not present

## 2018-08-27 MED FILL — PHENTERMINE 37.5 MG TABLET: 37.5 | 30 days supply | Qty: 30 | Fill #1

## 2018-09-26 DIAGNOSIS — M722 Plantar fascial fibromatosis: Secondary | ICD-10-CM | POA: Diagnosis not present

## 2018-10-08 DIAGNOSIS — M722 Plantar fascial fibromatosis: Secondary | ICD-10-CM | POA: Diagnosis not present

## 2018-10-10 DIAGNOSIS — Z01419 Encounter for gynecological examination (general) (routine) without abnormal findings: Secondary | ICD-10-CM | POA: Diagnosis not present

## 2018-10-11 MED FILL — NORETHIND-ETH ESTRAD 0.5-2.: 0.5-2.5 | 84 days supply | Qty: 84 | Fill #0

## 2018-10-11 MED FILL — PHENTERMINE 37.5 MG TABLET: 37.5 | 30 days supply | Qty: 30 | Fill #0

## 2018-10-12 DIAGNOSIS — Z01419 Encounter for gynecological examination (general) (routine) without abnormal findings: Secondary | ICD-10-CM | POA: Diagnosis not present

## 2018-10-12 DIAGNOSIS — Z1151 Encounter for screening for human papillomavirus (HPV): Secondary | ICD-10-CM | POA: Diagnosis not present

## 2018-10-24 DIAGNOSIS — M792 Neuralgia and neuritis, unspecified: Secondary | ICD-10-CM | POA: Insufficient documentation

## 2018-10-24 MED FILL — predniSONE 10 MG TABS: 10 | 6 days supply | Qty: 21 | Fill #0

## 2018-10-25 MED FILL — traZODone HCL 50 MG TABS: 50 | 30 days supply | Qty: 30 | Fill #1

## 2018-11-13 MED FILL — PHENTERMINE 37.5 MG TABLET: 37.5 | 30 days supply | Qty: 30 | Fill #1

## 2018-12-12 DIAGNOSIS — M5431 Sciatica, right side: Secondary | ICD-10-CM | POA: Insufficient documentation

## 2018-12-12 MED FILL — traZODone HCL 50 MG TABS: 50 | 30 days supply | Qty: 30 | Fill #2

## 2018-12-17 MED FILL — PHENTERMINE 37.5 MG TABLET: 37.5 | 30 days supply | Qty: 30 | Fill #0

## 2018-12-19 DIAGNOSIS — M67952 Unspecified disorder of synovium and tendon, left thigh: Secondary | ICD-10-CM | POA: Insufficient documentation

## 2019-02-07 MED FILL — traZODone HCL 50 MG TABS: 50 | 30 days supply | Qty: 30 | Fill #3

## 2019-02-18 ENCOUNTER — Ambulatory Visit (INDEPENDENT_AMBULATORY_CARE_PROVIDER_SITE_OTHER): Payer: 59 | Admitting: Family Medicine

## 2019-02-18 ENCOUNTER — Other Ambulatory Visit: Payer: Self-pay

## 2019-02-18 ENCOUNTER — Encounter: Payer: Self-pay | Admitting: Family Medicine

## 2019-02-18 VITALS — BP 108/68 | HR 114 | Temp 99.2°F | Wt 208.0 lb

## 2019-02-18 DIAGNOSIS — M545 Low back pain, unspecified: Secondary | ICD-10-CM

## 2019-02-18 LAB — POC URINALSYSI DIPSTICK (AUTOMATED)
BILIRUBIN UA: NEGATIVE
Glucose, UA: NEGATIVE
KETONES UA: NEGATIVE
Nitrite, UA: NEGATIVE
PH UA: 5.5 (ref 5.0–8.0)
PROTEIN UA: POSITIVE — AB
SPEC GRAV UA: 1.02 (ref 1.010–1.025)
Urobilinogen, UA: 0.2 E.U./dL

## 2019-02-18 MED ORDER — SULFAMETHOXAZOLE-TRIMETHOPRIM 800-160 MG PO TABS: 1.0000 | ORAL_TABLET | Freq: Two times a day (BID) | ORAL | 0 refills | Status: AC

## 2019-02-18 MED ORDER — FLUCONAZOLE 150 MG PO TABS: 150.0000 mg | ORAL_TABLET | Freq: Once | ORAL | 0 refills | Status: AC

## 2019-02-18 MED ORDER — HYDROCODONE-ACETAMINOPHEN 5-325 MG PO TABS: 1.0000 | ORAL_TABLET | Freq: Four times a day (QID) | ORAL | 0 refills | Status: DC | PRN

## 2019-02-18 MED ORDER — CEFTRIAXONE SODIUM 1 G IJ SOLR
1.0000 g | Freq: Once | INTRAMUSCULAR | Status: AC
  Administered 2019-02-18: 1 g via INTRAMUSCULAR

## 2019-02-18 MED ORDER — TAMSULOSIN HCL 0.4 MG PO CAPS: 0.4000 mg | ORAL_CAPSULE | Freq: Every day | ORAL | 0 refills | Status: AC

## 2019-02-18 MED ORDER — ONDANSETRON HCL 4 MG PO TABS: 4.0000 mg | ORAL_TABLET | Freq: Three times a day (TID) | ORAL | 0 refills | Status: DC | PRN

## 2019-02-18 MED FILL — TAMSULOSIN HCL 0.4 MG CAP: 0.4 | 14 days supply | Qty: 14 | Fill #0

## 2019-02-18 MED FILL — ONDANSETRON HCL 4 MG TABLET: 4 | 6 days supply | Qty: 20 | Fill #0

## 2019-02-18 MED FILL — HYDROCODON-APAP 5-325: 5-325 | 3 days supply | Qty: 12 | Fill #0

## 2019-02-18 MED FILL — SULFAMETHOXAZOLE-TMP DS TAB: 800-160 | 6 days supply | Qty: 12 | Fill #0

## 2019-02-18 MED FILL — FLUCONAZOLE 150 MG TABS: 150 | 1 days supply | Qty: 1 | Fill #0

## 2019-02-18 NOTE — Patient Instructions (Signed)
We are covering for both a kidney/bladder infection as well as a kidney stone.   Stay hydrated.   Warning signs/symptoms: Uncontrollable nausea/vomiting, fevers, worsening symptoms despite treatment, confusion.  Give [place] around 2 business days to get culture back to you.  Do not drink alcohol, do any illicit/street drugs, drive or do anything that requires alertness while on this medicine.   Start the Bactrim tomorrow. Diflucan has been sent in the event you get a yeast infection from the antibiotic.   Let us know if you need anything.

## 2019-02-18 NOTE — Progress Notes (Signed)
Chief Complaint  Patient presents with  . Back Pain    Kristie Martinez is a 58 y.o. female here for possible UTI vs kidney stone.  Duration: 2 days. Symptoms: urinary frequency, fever (Tmax 99.8 F), flank pain on left, nausea, vomiting and urinary incontinence Denies: hematuria, dysuria Hx of recurrent UTI? No  ROS:  Constitutional: +low grade fevers  GU: As noted in HPI  Past Medical History:  Diagnosis Date  . Indigestion   . Post-operative nausea and vomiting   . Varicose veins     BP 108/68 (BP Location: Left Arm, Patient Position: Sitting, Cuff Size: Normal)   Pulse (!) 114   Temp 99.2 F (37.3 C) (Oral)   Wt 208 lb (94.3 kg)   BMI 33.83 kg/m  General: Awake, alert, appears stated age Heart: RRR (HR around 84 on my exam) Lungs: CTAB, normal respiratory effort, no accessory muscle usage Abd: BS+, soft, ttp in suprapubic region, ND, no masses or organomegaly MSK: No CVA tenderness, neg Lloyd's sign, there is ttp over the L lower lumbar region Psych: Age appropriate judgment and insight  Acute left-sided low back pain without sciatica - Plan: POCT Urinalysis Dipstick (Automated), Urine Culture, sulfamethoxazole-trimethoprim (BACTRIM DS,SEPTRA DS) 800-160 MG tablet, ondansetron (ZOFRAN) 4 MG tablet, HYDROcodone-acetaminophen (NORCO/VICODIN) 5-325 MG tablet, fluconazole (DIFLUCAN) 150 MG tablet, tamsulosin (FLOMAX) 0.4 MG CAPS capsule, cefTRIAXone (ROCEPHIN) injection 1 g  Orders as above. Given suprapubic ttp, will cover for pyelonephritis. Will also cover for renal stone, though I think if that is the case, she has passed it. Stay hydrated. Seek immediate care if pt starts to develop fevers, new/worsening symptoms, uncontrollable N/V. F/u prn. The patient voiced understanding and agreement to the plan.  Jilda Roche [place], DO 02/18/19 10:21 AM

## 2019-02-19 LAB — URINE CULTURE
MICRO NUMBER:: 362363
SPECIMEN QUALITY:: ADEQUATE

## 2019-03-26 MED FILL — PHENTERMINE 37.5 MG TABLET: 37.5 | 30 days supply | Qty: 30 | Fill #0

## 2019-04-25 MED FILL — SULINDAC 200 MG TABLET: 200 | 30 days supply | Qty: 60 | Fill #0

## 2019-05-09 ENCOUNTER — Ambulatory Visit: Payer: PRIVATE HEALTH INSURANCE | Attending: Physical Medicine and Rehabilitation | Admitting: Physical Therapy

## 2019-05-09 ENCOUNTER — Encounter: Payer: Self-pay | Admitting: Physical Therapy

## 2019-05-09 ENCOUNTER — Other Ambulatory Visit: Payer: Self-pay

## 2019-05-09 DIAGNOSIS — M6281 Muscle weakness (generalized): Secondary | ICD-10-CM | POA: Insufficient documentation

## 2019-05-09 DIAGNOSIS — R252 Cramp and spasm: Secondary | ICD-10-CM | POA: Diagnosis present

## 2019-05-09 DIAGNOSIS — R29898 Other symptoms and signs involving the musculoskeletal system: Secondary | ICD-10-CM | POA: Diagnosis present

## 2019-05-09 DIAGNOSIS — M79604 Pain in right leg: Secondary | ICD-10-CM | POA: Diagnosis present

## 2019-05-09 NOTE — Therapy (Signed)
[facility] [place] [address]  [address] [place], [place], [address] Phone: [phone]   Fax:  [phone]  Physical Therapy Evaluation  Patient Details  Name: Kristie Martinez MRN: [id number] Date of Birth: Sep 28, 1961 Referring Provider (PT): Herminio HeadsShawn Dalton-Bethea, MD   Encounter Date: 05/09/2019  PT End of Session - 05/09/19 0810    Visit Number  1    Number of Visits  9    Authorization Type  WC - 1 Eval + 8 Treat    Authorization - Visit Number  1    Authorization - Number of Visits  9    PT Start Time  0810   Pt arrived late   PT Stop Time  0928    PT Time Calculation (min)  78 min    Activity Tolerance  Patient tolerated treatment well    Behavior During Therapy  [facility] for tasks assessed/performed       Past Medical History:  Diagnosis Date  . Indigestion   . Post-operative nausea and vomiting   . Varicose veins     Past Surgical History:  Procedure Laterality Date  . CERVICAL DISC ARTHROPLASTY    . ENDOMETRIAL ABLATION    . ENDOVENOUS ABLATION SAPHENOUS VEIN W/ LASER  09-27-2012   left greater saphenous vein  by Gretta Beganodd Early MD  . ENDOVENOUS ABLATION SAPHENOUS VEIN W/ LASER  11-08-2012   right greater saphenous vein   by Gretta Beganodd Early MD  . mini gastric bypass   2004  . OTHER SURGICAL HISTORY  2004   mini gastric bypass  . TONSILLECTOMY    . TONSILLECTOMY AND ADENOIDECTOMY  1970    There were no vitals filed for this visit.   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 Was doing a "take down" - was not comfortable with movement due to preexisting tightness but was pressured by instructor to attempt the maneuver anyway. Felt a pop with immediate pain, tried to put pressure on leg with inability. Was previously seen for PT in 2018 with good relief but return of spams after [place]  [place]ing in [place]. Went back to MD and was told to "give it another 6 months". Eventually was referred to ortho neurologist. 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 Referred to physiatrist. Had PRP treatment in March 2020. Referred back to PT to ensure proper performance of HEP. Reports R leg still get severe cramps and feels like it's giving out at times - states she has to walk slower due to fear on falling.    Limitations  Sitting;Walking    How long can you sit comfortably?  20 minutes    How long can you walk comfortably?  15-20 minutes    Patient Stated Goals  "refamiliarize myself with exercises and see if I can get the pain better managed"    Currently in Pain?  Yes    Pain Score  3    10/10 when "knot" occurs   Pain Location  Leg    Pain Orientation  Right;Posterior;Upper;Mid    Pain Descriptors / Indicators  Constant;Throbbing    Pain Type  Chronic pain    Pain Radiating Towards  burning & "knotting" down back of R thigh to knee    Pain Onset  Other (comment)   June 2018   Pain  Frequency  Constant    Aggravating Factors   walking up inclines, overdoing things with exercises    Pain Relieving Factors  rubbing; topical pain meds (fentanyl)    Effect of Pain on Daily Activities  difficulty walking; unable to dance (line dance & shag)         [facility] PT Assessment - 05/09/19 0810      Assessment   Medical Diagnosis  R hamstring strain & gluteal tendinitis    Referring Provider (PT)  Herminio HeadsShawn Dalton-Bethea, MD    Onset Date/Surgical Date  05/09/17    Next MD Visit  05/27/19    Prior Therapy  PT in 2018 for same issue      Precautions   Precautions  None      Restrictions   Weight Bearing Restrictions  No      Balance Screen   Has the patient fallen in the past 6 months  Yes    How many times?  3    Has the patient had a decrease in activity level because of a fear of falling?   Yes    Is the patient reluctant to leave their home because of a fear of falling?   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[employer], Conservation officer, naturelegal consulting    Leisure  dancing (line & shag); walking 1 mile daily [place]      Cognition   Overall Cognitive Status  Within Functional Limits for tasks assessed      Observation/Other Assessments   Focus on Therapeutic Outcomes (FOTO)   Upper leg - 55% (45% limitation); Predicted 64% (36% limitation)      Sensation   Light Touch  Appears Intact      Coordination   Gross Motor Movements are Fluid and Coordinated  Yes      Posture/Postural Control   Posture/Postural Control  Postural limitations    Postural Limitations  Weight shift left      ROM / Strength   AROM / PROM / Strength  AROM;Strength      AROM   Overall AROM Comments  limited R hip flexion, IR & ER      Strength   Strength Assessment Site  Hip;Knee    Right/Left Hip  Right;Left    Right Hip Flexion  4-/5    Right Hip Extension  3-/5    Right Hip External Rotation   3-/5    Right Hip Internal Rotation  4/5    Right Hip ABduction  3/5    Right Hip ADduction  3+/5    Left Hip Flexion  4+/5    Left Hip Extension  4-/5    Left Hip External Rotation  4-/5    Left Hip Internal Rotation  4/5    Left Hip ABduction  4/5    Left  Hip ADduction  4/5    Right/Left Knee  Right;Left    Right Knee Flexion  4-/5    Right Knee Extension  4-/5    Left Knee Flexion  4+/5    Left Knee Extension  4+/5      Flexibility   Soft Tissue Assessment /Muscle Length  yes     Hamstrings  R moderate tightness    Quadriceps  R mild tightness    ITB  R mod tightness    Piriformis  R severe tightness      Palpation   Palpation comment  Increased muscle tension & ttp proximal and mid-belly HS (more pronounced in lateral HS), increased muscle tension in R glutes/piriformis & gastroc muscle belly                Objective measurements completed on examination: See above findings.      [facility] Adult PT Treatment/Exercise - 05/09/19 0810      Exercises   Exercises  Knee/Hip      Knee/Hip Exercises: Stretches   Passive Hamstring Stretch  Right;3 reps;30 seconds    Passive Hamstring Stretch Limitations  seated hip hinge with R LE extended + hip IR/ER to isolate medial & lateral HS; supine with strap (1 rep)    Piriformis Stretch  Right;2 reps;30 seconds   each   Piriformis Stretch Limitations  seated & supine KTOS    Gastroc Stretch  Right;2 reps;30 seconds    Gastroc Stretch Limitations  standing with R LE extended & toes on baseboard             PT Education - 05/09/19 0928    Education provided  Yes    Education Details  PT eval findings, anticipated POC & initial HEP    Person(s) Educated  Patient    Methods  Explanation;Demonstration;Handout    Comprehension  Verbalized understanding;Returned demonstration;Need further instruction          PT Long Term Goals - 05/09/19 0928      PT LONG TERM GOAL #1   Title  Independent with ongoing HEP    Status  New    Target Date  06/20/19      PT LONG TERM GOAL #2   Title  Patient to improve R HS flexibility to [facility] or symmetrical of L LE without pain limiting    Status  New    Target Date  06/20/19      PT LONG TERM GOAL #3   Title  Patient to improve R LE strength to >/= 4/5 w/o increased pain    Status  New    Target Date  06/20/19      PT LONG TERM GOAL #4   Title  Patient will report ability to walk at speed sufficient to get her HR up for exercise w/o feeling of imbalance or  increased risk for falls    Status  New    Target Date  06/20/19             Plan - 05/09/19 [id number]    Clinical Impression Statement  Kristie Martinez is a 58 y/o female who presents to OP PT for ongoing R LE pain and abnormal muscle tension related to R hamstring strain and gluteal tendinitis stemming from a work place injury in June 2018 during a Journalist, newspaperself-defense training class. Patient went through episode of PT in 2018 with good relief reported but since has had recurrence of pain and muscle cramps/spasms which limit sitting and walking  tolerance. Pain remains localized to R buttock and posterior thigh with increased muscle tension/taut bands and ttp in R glutes and HS (especially lateral HS). Pain and impaired flexibility limit R hip AROM in flexion, IR and ER with significant weakness evident throughout R hip musculature. Terryn will benefit from skilled PT to address pain and above deficits to allow for improved quality of life and return to normal level of activity with reduced pain.    Personal Factors and Comorbidities  Time since onset of injury/illness/exacerbation;Past/Current Experience    Examination-Activity Limitations  Locomotion Level;Sit;Squat;Stairs    Rehab Potential  Good    PT Frequency  2x / week    PT Duration  4 weeks    PT Treatment/Interventions  Patient/family education;Therapeutic exercise;Therapeutic activities;Functional mobility training;Gait training;Stair training;Balance training;Manual techniques;Passive range of motion;Dry needling;Taping;Electrical Stimulation;Moist Heat;Ultrasound;Cryotherapy;Vasopneumatic Device;Iontophoresis 4mg /ml Dexamethasone;ADLs/Self Care Home Management    PT Next Visit Plan  Review initial HEP & ongoing home exercises from prior PT episode; manual therapy including possible DN for abnormal muscle tension; core/proximal LE strengthening; modalities PRN    Consulted and Agree with Plan of Care  Patient       Patient will benefit from skilled  therapeutic intervention in order to improve the following deficits and impairments:  Pain, Hypomobility, Impaired flexibility, Increased muscle spasms, Increased fascial restricitons, Decreased range of motion, Decreased strength, Decreased mobility, Difficulty walking, Abnormal gait, Decreased activity tolerance, Decreased balance  Visit Diagnosis: 1. Pain in right leg   2. Cramp and spasm   3. Other symptoms and signs involving the musculoskeletal system   4. Muscle weakness (generalized)        Problem List Patient Active Problem List   Diagnosis Date Noted  . Hot flashes 05/21/2018  . Chest pain 11/17/2016  . Status post gastric bypass for obesity 03/07/2013  . Varicose veins of lower extremities with other complications 12/87/8676    Percival Spanish, PT, MPT 05/09/2019, 12:37 PM  [facility] [address]  [place], [place], [address] Phone: [phone]   Fax:  [phone]  Name: Kristie Martinez MRN: [id number] Date of Birth: 03-Jul-1961

## 2019-05-13 ENCOUNTER — Other Ambulatory Visit: Payer: Self-pay

## 2019-05-13 ENCOUNTER — Encounter: Payer: Self-pay | Admitting: Physical Therapy

## 2019-05-13 ENCOUNTER — Ambulatory Visit: Payer: PRIVATE HEALTH INSURANCE | Admitting: Physical Therapy

## 2019-05-13 DIAGNOSIS — R252 Cramp and spasm: Secondary | ICD-10-CM

## 2019-05-13 DIAGNOSIS — M6281 Muscle weakness (generalized): Secondary | ICD-10-CM

## 2019-05-13 DIAGNOSIS — M79604 Pain in right leg: Secondary | ICD-10-CM | POA: Diagnosis not present

## 2019-05-13 DIAGNOSIS — R29898 Other symptoms and signs involving the musculoskeletal system: Secondary | ICD-10-CM

## 2019-05-13 NOTE — Therapy (Signed)
[facility] [address]  [address] [place], [place], [address] Phone: [phone]   Fax:  [phone]  Physical Therapy Treatment  Patient Details  Name: Kristie Martinez MRN: [id number] Date of Birth: 05/26/1961 Referring Provider (PT): Herminio HeadsShawn Dalton-Bethea, MD   Encounter Date: 05/13/2019  PT End of Session - 05/13/19 1700    Visit Number  2    Number of Visits  9    Authorization Type  WC - 1 Eval + 8 Treat    Authorization - Visit Number  2    Authorization - Number of Visits  9    PT Start Time  1700    PT Stop Time  1801    PT Time Calculation (min)  61 min    Activity Tolerance  Patient tolerated treatment well    Behavior During Therapy  [facility] for tasks assessed/performed       Past Medical History:  Diagnosis Date  . Indigestion   . Post-operative nausea and vomiting   . Varicose veins     Past Surgical History:  Procedure Laterality Date  . CERVICAL DISC ARTHROPLASTY    . ENDOMETRIAL ABLATION    . ENDOVENOUS ABLATION SAPHENOUS VEIN W/ LASER  09-27-2012   left greater saphenous vein  by Gretta Beganodd Early MD  . ENDOVENOUS ABLATION SAPHENOUS VEIN W/ LASER  11-08-2012   right greater saphenous vein   by Gretta Beganodd Early MD  . mini gastric bypass   2004  . OTHER SURGICAL HISTORY  2004   mini gastric bypass  . TONSILLECTOMY    . TONSILLECTOMY AND ADENOIDECTOMY  1970    There were no vitals filed for this visit.  Subjective Assessment - 05/13/19 1703    Subjective  Pt reports she was much more busy/active over the weekend with her trip for her meeting at the beach. Limited tolerance for sitting during the meeting. Was able to dance at an outdoor music festival for a few songs but felt "like I had arthritis all over the next day".    Patient Stated Goals  "refamiliarize myself with exercises and see if I can get the pain better managed"    Currently in Pain?  Yes    Pain Score  3     Pain Location  Leg    Pain Orientation   Right;Posterior;Upper;Mid    Pain Descriptors / Indicators  Throbbing    Pain Type  Chronic pain    Pain Frequency  Intermittent                       [facility] Adult PT Treatment/Exercise - 05/13/19 1700      Exercises   Exercises  Knee/Hip      Knee/Hip Exercises: Stretches   Passive Hamstring Stretch  Right;3 reps;30 seconds    Passive Hamstring Stretch Limitations  seated hip hinge with R LE extended + hip IR/ER to isolate medial & lateral HS; supine with foot propped on doorframe    Piriformis Stretch  Right;2 reps;30 seconds   each   Piriformis Stretch Limitations  seated KTOS    Gastroc Stretch  Right;2 reps;30 seconds    Gastroc Stretch Limitations  standing with R LE extended & toes on baseboard      Knee/Hip Exercises: Aerobic   Nustep  L4 x 6 min - LE only      Modalities   Modalities  Moist Heat      Moist Heat  Therapy   Number Minutes Moist Heat  10 Minutes    Moist Heat Location  Other (comment)   R hamstrings     Manual Therapy   Manual Therapy  Soft tissue mobilization;Myofascial release    Manual therapy comments  patient prone    Soft tissue mobilization  STM to R medial & lateral HS muscle groups    Myofascial Release  manual TPR and pin & stretch to R medial & lateral HS       Trigger Point Dry Needling - 05/13/19 1700    Consent Given?  Yes    Education Handout Provided  Yes    Muscles Treated Lower Quadrant  Hamstring   R medial & lateral HS - multiple levels   Hamstring Response  Twitch response elicited;Palpable increased muscle length                PT Long Term Goals - 05/13/19 1707      PT LONG TERM GOAL #1   Title  Independent with ongoing HEP    Status  On-going    Target Date  06/20/19      PT LONG TERM GOAL #2   Title  Patient to improve R HS flexibility to [facility] or symmetrical of L LE without pain limiting    Status  On-going    Target Date  06/20/19      PT LONG TERM GOAL #3   Title  Patient to improve R  LE strength to >/= 4/5 w/o increased pain    Status  On-going    Target Date  06/20/19      PT LONG TERM GOAL #4   Title  Patient will report ability to walk at speed sufficient to get her HR up for exercise w/o feeling of imbalance or increased risk for falls    Status  On-going    Target Date  06/20/19            Plan - 05/13/19 1708    Clinical Impression Statement  Kristie Martinez reporting slight increased soreness/pain after a busier than usual weekend [place]. Increased muscle tension and ttp evident t/o R medial and lateral hamstring muscles as well as distal adductor group. Given prior positive response to DN in prior therapy episode, performed DN (after informed patient consent) in conjunction with manual STM/MFR to R medial and lateral HS groups with positive twitch response elicited and palpable reduction in muscle tension. Manual therapy followed by review of initial HEP stretches with clarifications as necessary. Treatment concluded with moist heat to R hamstrings to promote further muscle relaxation.    Rehab Potential  Good    PT Frequency  2x / week    PT Duration  4 weeks    PT Treatment/Interventions  Patient/family education;Therapeutic exercise;Therapeutic activities;Functional mobility training;Gait training;Stair training;Balance training;Manual techniques;Passive range of motion;Dry needling;Taping;Electrical Stimulation;Moist Heat;Ultrasound;Cryotherapy;Vasopneumatic Device;Iontophoresis 4mg /ml Dexamethasone;ADLs/Self Care Home Management    PT Next Visit Plan  Assess response to DN; review ongoing home exercises from prior PT episode; manual therapy including possible DN for abnormal muscle tension; core/proximal LE strengthening; modalities PRN       Patient will benefit from skilled therapeutic intervention in order to improve the following deficits and impairments:  Pain, Hypomobility, Impaired flexibility, Increased muscle spasms,  Increased fascial restricitons, Decreased range of motion, Decreased strength, Decreased mobility, Difficulty walking, Abnormal gait, Decreased activity tolerance, Decreased balance  Visit Diagnosis: 1. Pain in right leg   2.  Cramp and spasm   3. Other symptoms and signs involving the musculoskeletal system   4. Muscle weakness (generalized)        Problem List Patient Active Problem List   Diagnosis Date Noted  . Hot flashes 05/21/2018  . Chest pain 11/17/2016  . Status post gastric bypass for obesity 03/07/2013  . Varicose veins of lower extremities with other complications 35/57/3220    Percival Spanish, PT, MPT 05/13/2019, 6:08 PM  [facility] [address]  [address] [place], [place], [address] Phone: [phone]   Fax:  [phone]  Name: Kristie Martinez MRN: [id number] Date of Birth: Oct 20, 1961

## 2019-05-13 NOTE — Patient Instructions (Signed)

## 2019-05-16 ENCOUNTER — Encounter: Payer: Self-pay | Admitting: Physical Therapy

## 2019-05-16 ENCOUNTER — Other Ambulatory Visit: Payer: Self-pay

## 2019-05-16 ENCOUNTER — Ambulatory Visit: Payer: PRIVATE HEALTH INSURANCE | Admitting: Physical Therapy

## 2019-05-16 DIAGNOSIS — M79604 Pain in right leg: Secondary | ICD-10-CM

## 2019-05-16 DIAGNOSIS — R29898 Other symptoms and signs involving the musculoskeletal system: Secondary | ICD-10-CM

## 2019-05-16 DIAGNOSIS — M6281 Muscle weakness (generalized): Secondary | ICD-10-CM

## 2019-05-16 DIAGNOSIS — R252 Cramp and spasm: Secondary | ICD-10-CM

## 2019-05-16 NOTE — Therapy (Signed)
[facility] [place] [address]  [address] [place], [place], [address] Phone: [phone]   Fax:  [phone]  Physical Therapy Treatment  Patient Details  Name: Kristie Martinez MRN: [id number] Date of Birth: 03-04-1961 Referring Provider (PT): Herminio HeadsShawn Dalton-Bethea, MD   Encounter Date: 05/16/2019  PT End of Session - 05/16/19 1658    Visit Number  3    Number of Visits  9    Authorization Type  WC - 1 Eval + 8 Treat    Authorization - Visit Number  3    Authorization - Number of Visits  9    PT Start Time  1658    PT Stop Time  1754    PT Time Calculation (min)  56 min    Activity Tolerance  Patient tolerated treatment well    Behavior During Therapy  [facility] for tasks assessed/performed       Past Medical History:  Diagnosis Date  . Indigestion   . Post-operative nausea and vomiting   . Varicose veins     Past Surgical History:  Procedure Laterality Date  . CERVICAL DISC ARTHROPLASTY    . ENDOMETRIAL ABLATION    . ENDOVENOUS ABLATION SAPHENOUS VEIN W/ LASER  09-27-2012   left greater saphenous vein  by Gretta Beganodd Early MD  . ENDOVENOUS ABLATION SAPHENOUS VEIN W/ LASER  11-08-2012   right greater saphenous vein   by Gretta Beganodd Early MD  . mini gastric bypass   2004  . OTHER SURGICAL HISTORY  2004   mini gastric bypass  . TONSILLECTOMY    . TONSILLECTOMY AND ADENOIDECTOMY  1970    There were no vitals filed for this visit.  Subjective Assessment - 05/16/19 1702    Subjective  Pt reports she was feeling so much better after the DN that she may have overdone it - went to the driving range and hit a bucket of balls with her husband and then did some exercises in the pool before working 2nd shift last night. Pain and muscle tension in lower HS remains decreased, now noting more proximal HS & adductor pain today.    Patient Stated Goals  "refamiliarize myself with exercises and see if I can get the pain better managed"    Currently in  Pain?  Yes    Pain Score  3    2-3/10                      [facility] Adult PT Treatment/Exercise - 05/16/19 1658      Exercises   Exercises  Knee/Hip      Knee/Hip Exercises: Aerobic   Recumbent Bike  L2 x 6 min      Manual Therapy   Manual Therapy  Soft tissue mobilization;Myofascial release    Manual therapy comments  patient prone & supine    Soft tissue mobilization  STM to R lateral > medial HS muscle groups with emphasis on mid/proximal muscle belly, R glutes, & R adductor group    Myofascial Release  manual TPR and pin & stretch to R medial & lateral HS, manual TPR to R glute minimus & medius, adductor longus & magnus       Trigger Point Dry Needling - 05/16/19 1658    Consent Given?  Yes    Muscles Treated Lower Quadrant  Hamstring;Adductor longus/brevis/magnus    Muscles Treated Back/Hip  Gluteus minimus;Gluteus medius    Adductor Response  Twitch response elicited;Palpable increased  muscle length   R adductor magnus & longus   Hamstring Response  Twitch response elicited;Palpable increased muscle length   R mid to proximal medial & lateral HS   Gluteus Minimus Response  Twitch response elicited;Palpable increased muscle length   R lateral   Gluteus Medius Response  Twitch response elicited;Palpable increased muscle length   R lateral               PT Long Term Goals - 05/13/19 1707      PT LONG TERM GOAL #1   Title  Independent with ongoing HEP    Status  On-going    Target Date  06/20/19      PT LONG TERM GOAL #2   Title  Patient to improve R HS flexibility to [facility] or symmetrical of L LE without pain limiting    Status  On-going    Target Date  06/20/19      PT LONG TERM GOAL #3   Title  Patient to improve R LE strength to >/= 4/5 w/o increased pain    Status  On-going    Target Date  06/20/19      PT LONG TERM GOAL #4   Title  Patient will report ability to walk at speed sufficient to get her HR up for exercise w/o feeling of  imbalance or increased risk for falls    Status  On-going    Target Date  06/20/19            Plan - 05/16/19 1706    Clinical Impression Statement  Kristie Martinez reporting such good relief from manual therapy and DN last visit that she decided to try going to the driving range for the first time in 2 years followed by exercising and working 2nd shift - thinks she may have overdone it, noting increased R proximal HS, glute and adductor pain. Pain and muscle tension in distal HS where DN performed last visit remains decreased. Given positive response to manual STM/MFR/TPR and DN last session, targeted areas of increased pain today with multiple TPS and taut bands identified - strong twitch responses elicited with all DN, especially in adductor group. Patient encouraged to follow-up with her home stretches and continue with her normal level of activity including her evening walk, but cautioned he not to overdo or increase activity beyond her usual even if noting benefit again today.    Rehab Potential  Good    PT Frequency  2x / week    PT Duration  4 weeks    PT Treatment/Interventions  Patient/family education;Therapeutic exercise;Therapeutic activities;Functional mobility training;Gait training;Stair training;Balance training;Manual techniques;Passive range of motion;Dry needling;Taping;Electrical Stimulation;Moist Heat;Ultrasound;Cryotherapy;Vasopneumatic Device;Iontophoresis 4mg /ml Dexamethasone;ADLs/Self Care Home Management    PT Next Visit Plan  Assess response to DN; review ongoing home exercises from prior PT episode; manual therapy including possible DN for abnormal muscle tension; core/proximal LE strengthening; modalities PRN       Patient will benefit from skilled therapeutic intervention in order to improve the following deficits and impairments:  Pain, Hypomobility, Impaired flexibility, Increased muscle spasms, Increased fascial restricitons, Decreased range of motion, Decreased  strength, Decreased mobility, Difficulty walking, Abnormal gait, Decreased activity tolerance, Decreased balance  Visit Diagnosis: 1. Pain in right leg   2. Cramp and spasm   3. Other symptoms and signs involving the musculoskeletal system   4. Muscle weakness (generalized)        Problem List Patient Active Problem List   Diagnosis Date Noted  . Hot flashes  05/21/2018  . Chest pain 11/17/2016  . Status post gastric bypass for obesity 03/07/2013  . Varicose veins of lower extremities with other complications 15/72/6203    Percival Spanish, PT, MPT 05/16/2019, 6:20 PM  [facility] [address]  [address] [place], [place], [address] Phone: [phone]   Fax:  [phone]  Name: Kristie Martinez MRN: [id number] Date of Birth: May 20, 1961

## 2019-05-20 ENCOUNTER — Encounter: Payer: Self-pay | Admitting: Physical Therapy

## 2019-05-20 ENCOUNTER — Other Ambulatory Visit: Payer: Self-pay

## 2019-05-20 ENCOUNTER — Ambulatory Visit: Payer: PRIVATE HEALTH INSURANCE | Admitting: Physical Therapy

## 2019-05-20 DIAGNOSIS — M79604 Pain in right leg: Secondary | ICD-10-CM

## 2019-05-20 DIAGNOSIS — R252 Cramp and spasm: Secondary | ICD-10-CM

## 2019-05-20 DIAGNOSIS — R29898 Other symptoms and signs involving the musculoskeletal system: Secondary | ICD-10-CM

## 2019-05-20 DIAGNOSIS — M6281 Muscle weakness (generalized): Secondary | ICD-10-CM

## 2019-05-20 MED FILL — PHENTERMINE 37.5 MG TABLET: 37.5 | 30 days supply | Qty: 30 | Fill #1

## 2019-05-20 NOTE — Therapy (Signed)
[facility] [place] [address]  [address] [place], [place], [address] Phone: [phone]   Fax:  [phone]  Physical Therapy Treatment  Patient Details  Name: Kristie RouteDenise E Martinez MRN: [id number] Date of Birth: 03-14-1961 Referring Provider (PT): Herminio HeadsShawn Dalton-Bethea, MD   Encounter Date: 05/20/2019  PT End of Session - 05/20/19 0806    Visit Number  4    Number of Visits  9    Authorization Type  WC - 1 Eval + 8 Treat    Authorization - Visit Number  4    Authorization - Number of Visits  9    PT Start Time  0806    PT Stop Time  0853    PT Time Calculation (min)  47 min    Activity Tolerance  Patient tolerated treatment well    Behavior During Therapy  [facility] for tasks assessed/performed       Past Medical History:  Diagnosis Date  . Indigestion   . Post-operative nausea and vomiting   . Varicose veins     Past Surgical History:  Procedure Laterality Date  . CERVICAL DISC ARTHROPLASTY    . ENDOMETRIAL ABLATION    . ENDOVENOUS ABLATION SAPHENOUS VEIN W/ LASER  09-27-2012   left greater saphenous vein  by Gretta Beganodd Early MD  . ENDOVENOUS ABLATION SAPHENOUS VEIN W/ LASER  11-08-2012   right greater saphenous vein   by Gretta Beganodd Early MD  . mini gastric bypass   2004  . OTHER SURGICAL HISTORY  2004   mini gastric bypass  . TONSILLECTOMY    . TONSILLECTOMY AND ADENOIDECTOMY  1970    There were no vitals filed for this visit.  Subjective Assessment - 05/20/19 0808    Subjective  Pt noting benefit from DN last session. This morning now only experiencing some initial achiness upon rising in the morning.    Patient Stated Goals  "refamiliarize myself with exercises and see if I can get the pain better managed"    Currently in Pain?  Yes    Pain Score  1     Pain Location  Leg    Pain Orientation  Right;Posterior;Upper;Mid    Pain Descriptors / Indicators  Aching    Pain Frequency  Intermittent                        [facility] Adult PT Treatment/Exercise - 05/20/19 0806      Self-Care   Self-Care  --      Exercises   Exercises  Knee/Hip      Knee/Hip Exercises: Stretches   Other Knee/Hip Stretches  R hip adductor stretches x 30 sec (mutiple options with pt noting best stretch with supine with strap version)      Knee/Hip Exercises: Aerobic   Recumbent Bike  L2 x 6 min      Knee/Hip Exercises: Standing   Hip Flexion  Right;10 reps;Knee straight;Stengthening    Hip Flexion Limitations  red TB at ankle; 1 pole A for balance    Hip ADduction  Right;10 reps;Strengthening    Hip ADduction Limitations  red TB at ankle; 1 pole A for balance    Hip Abduction  Right;10 reps;Knee straight;Stengthening    Abduction Limitations  red TB at ankle; 1 pole A for balance    Hip Extension  Right;10 reps;Knee straight;Stengthening    Extension Limitations  red TB at ankle; 1 pole A for balance  Knee/Hip Exercises: Supine   Bridges  Both;15 reps;Strengthening   5" hold   Bridges Limitations  + red TB hip abduction isometric      Knee/Hip Exercises: Sidelying   Clams  R clam with rred TB x 15             PT Education - 05/20/19 0853    Education provided  Yes    Education Details  HEP update - hip adductor stretch, proximal LE strengthening    Person(s) Educated  Patient    Methods  Explanation;Demonstration;Handout    Comprehension  Verbalized understanding;Returned demonstration;Need further instruction          PT Long Term Goals - 05/13/19 1707      PT LONG TERM GOAL #1   Title  Independent with ongoing HEP    Status  On-going    Target Date  06/20/19      PT LONG TERM GOAL #2   Title  Patient to improve R HS flexibility to [facility] or symmetrical of L LE without pain limiting    Status  On-going    Target Date  06/20/19      PT LONG TERM GOAL #3   Title  Patient to improve R LE strength to >/= 4/5 w/o increased pain    Status  On-going    Target  Date  06/20/19      PT LONG TERM GOAL #4   Title  Patient will report ability to walk at speed sufficient to get her HR up for exercise w/o feeling of imbalance or increased risk for falls    Status  On-going    Target Date  06/20/19            Plan - 05/20/19 0829    Clinical Impression Statement  Ravinder continuing to note good relief from DN with only very mild achiness reported this am. She reports no issues with HEP stretches and denies need for review. As far as exercises from prior PT episode HEP, she reports she has essentially only been performing water based exercises such as standing SLRs in the water. Updated HEP to include hip adductor stretch as well as proximal LE strengthening targeting areas of weakness identified on eval, with red TBs provided for home.    Rehab Potential  Good    PT Frequency  2x / week    PT Duration  4 weeks    PT Treatment/Interventions  Patient/family education;Therapeutic exercise;Therapeutic activities;Functional mobility training;Gait training;Stair training;Balance training;Manual techniques;Passive range of motion;Dry needling;Taping;Electrical Stimulation;Moist Heat;Ultrasound;Cryotherapy;Vasopneumatic Device;Iontophoresis 4mg /ml Dexamethasone;ADLs/Self Care Home Management    PT Next Visit Plan  core/proximal LE strengthening; manual therapy including possible DN for abnormal muscle tension;  modalities PRN       Patient will benefit from skilled therapeutic intervention in order to improve the following deficits and impairments:  Pain, Hypomobility, Impaired flexibility, Increased muscle spasms, Increased fascial restricitons, Decreased range of motion, Decreased strength, Decreased mobility, Difficulty walking, Abnormal gait, Decreased activity tolerance, Decreased balance  Visit Diagnosis: 1. Pain in right leg   2. Cramp and spasm   3. Other symptoms and signs involving the musculoskeletal system   4. Muscle weakness (generalized)         Problem List Patient Active Problem List   Diagnosis Date Noted  . Hot flashes 05/21/2018  . Chest pain 11/17/2016  . Status post gastric bypass for obesity 03/07/2013  . Varicose veins of lower extremities with other complications 29/92/4268    [place]  Charlyne QualeKreis, PT, MPT 05/20/2019, 9:58 AM  [facility] [place] [address]  [address] [place], [place], [address] Phone: [phone]   Fax:  [phone]  Name: Kristie RouteDenise E Martinez MRN: [id number] Date of Birth: [birth year]

## 2019-05-27 ENCOUNTER — Other Ambulatory Visit: Payer: Self-pay

## 2019-05-27 ENCOUNTER — Encounter: Payer: Self-pay | Admitting: Physical Therapy

## 2019-05-27 ENCOUNTER — Ambulatory Visit: Payer: PRIVATE HEALTH INSURANCE | Attending: Physical Medicine and Rehabilitation | Admitting: Physical Therapy

## 2019-05-27 DIAGNOSIS — M79604 Pain in right leg: Secondary | ICD-10-CM | POA: Diagnosis not present

## 2019-05-27 DIAGNOSIS — R252 Cramp and spasm: Secondary | ICD-10-CM

## 2019-05-27 DIAGNOSIS — M6281 Muscle weakness (generalized): Secondary | ICD-10-CM

## 2019-05-27 DIAGNOSIS — R29898 Other symptoms and signs involving the musculoskeletal system: Secondary | ICD-10-CM | POA: Diagnosis present

## 2019-05-27 NOTE — Therapy (Addendum)
[facility] [address]  [address] [place], [place], [address] Phone: [phone]   Fax:  [phone]  Physical Therapy Treatment  Patient Details  Name: Kristie Martinez MRN: [id number] Date of Birth: 1961-11-19 Referring Provider (PT): Herminio HeadsShawn Dalton-Bethea, MD   Encounter Date: 05/27/2019  PT End of Session - 05/27/19 0808    Visit Number  5    Number of Visits  9    Authorization Type  WC - 1 Eval + 8 Treat    Authorization - Visit Number  5    Authorization - Number of Visits  9    PT Start Time  0808   Pt arrived late   PT Stop Time  0900    PT Time Calculation (min)  52 min    Activity Tolerance  Patient tolerated treatment well    Behavior During Therapy  [facility] ServicesWFL for tasks assessed/performed       Past Medical History:  Diagnosis Date  . Indigestion   . Post-operative nausea and vomiting   . Varicose veins     Past Surgical History:  Procedure Laterality Date  . CERVICAL DISC ARTHROPLASTY    . ENDOMETRIAL ABLATION    . ENDOVENOUS ABLATION SAPHENOUS VEIN W/ LASER  09-27-2012   left greater saphenous vein  by Gretta Beganodd Early MD  . ENDOVENOUS ABLATION SAPHENOUS VEIN W/ LASER  11-08-2012   right greater saphenous vein   by Gretta Beganodd Early MD  . mini gastric bypass   2004  . OTHER SURGICAL HISTORY  2004   mini gastric bypass  . TONSILLECTOMY    . TONSILLECTOMY AND ADENOIDECTOMY  1970    There were no vitals filed for this visit.  Subjective Assessment - 05/27/19 0810    Subjective  Pt reporting she can barely feel the pain anymore - only a little ache. Feels DN has really helped. Still gets the beginning of a cramp when doing clams with HEP but it goes away if she takes a break during the exercises.    Patient Stated Goals  "refamiliarize myself with exercises and see if I can get the pain better managed"    Currently in Pain?  Yes    Pain Score  1     Pain Location  Leg    Pain Orientation  Right;Upper    Pain  Descriptors / Indicators  Aching                       [facility] Adult PT Treatment/Exercise - 05/27/19 0808      Exercises   Exercises  Knee/Hip      Knee/Hip Exercises: Aerobic   Recumbent Bike  L2 x 6 min      Shoulder Exercises: Standing   Horizontal ABduction  Both;10 reps;Theraband;Strengthening    Theraband Level (Shoulder Horizontal ABduction)  Level 2 (Red)    Horizontal ABduction Limitations  standing against pool noodle on wall as tactile cue for scap retraction    External Rotation  Both;10 reps;Theraband;Strengthening    Theraband Level (Shoulder External Rotation)  Level 2 (Red)    External Rotation Limitations  standing against pool noodle on wall as tactile cue for scap retraction    Diagonals  Both;10 reps;Theraband;Strengthening    Theraband Level (Shoulder Diagonals)  Level 2 (Red)    Diagonals Limitations  standing against pool noodle on wall as tactile cue for scap retraction      Manual Therapy   Manual  Therapy  Soft tissue mobilization;Myofascial release;Other (comment)    Manual therapy comments  patient prone    Soft tissue mobilization  STM to R lateral > medial HS muscle groups with emphasis on mid/distal muscle belly; R UT & LS    Myofascial Release  manual TPR and pin & stretch to R medial & lateral HS, manual TPR to R UT/LS    Other Manual Therapy  Instructed pt in use of foam roller for self-STM/MFR to HS - Upon return demonstration, pt reporting limited tolerance due to pain/knots in R upper shoulder; Instructed pt in use of cane/Theracane for self TPR to UT/LS      Neck Exercises: Stretches   Upper Trapezius Stretch  Right;30 seconds;2 reps    Levator Stretch  Right;30 seconds;2 reps       Trigger Point Dry Needling - 05/27/19 [id number]    Consent Given?  Yes    Muscles Treated Head and Neck  Upper trapezius;Levator scapulae    Muscles Treated Lower Quadrant  Hamstring    Upper Trapezius Response  Twitch reponse elicited;Palpable  increased muscle length   Right   Levator Scapulae Response  Twitch response elicited;Palpable increased muscle length   Right   Hamstring Response  Twitch response elicited;Palpable increased muscle length   R distal medial & lateral HS          PT Education - 05/27/19 0900    Education Details  HEP update - UT/LS stretches & strengthening    Person(s) Educated  Patient    Methods  Explanation;Demonstration;Handout    Comprehension  Verbalized understanding;Returned demonstration;Need further instruction          PT Long Term Goals - 05/13/19 1707      PT LONG TERM GOAL #1   Title  Independent with ongoing HEP    Status  On-going    Target Date  06/20/19      PT LONG TERM GOAL #2   Title  Patient to improve R HS flexibility to [facility] or symmetrical of L LE without pain limiting    Status  On-going    Target Date  06/20/19      PT LONG TERM GOAL #3   Title  Patient to improve R LE strength to >/= 4/5 w/o increased pain    Status  On-going    Target Date  06/20/19      PT LONG TERM GOAL #4   Title  Patient will report ability to walk at speed sufficient to get her HR up for exercise w/o feeling of imbalance or increased risk for falls    Status  On-going    Target Date  06/20/19            Plan - 05/27/19 0813    Clinical Impression Statement  Kristie Martinez reporting pain R thigh remains much better since DN but still noting a mild ache which now wraps from posterior mid/lower thigh around to the front of her leg. Palpation of mid/distal R HS reproducing pain in HS as well as referred pain to front of leg - addressed with DN with positive twitch response and palpable reduction in muscle tension and pain. Attempted to instruct patient in use of foam roller, but patient reporting difficulty supporting weight and controlling motion due to R upper shoulder pain - patient notes this sometimes limits her during other HEP exercises. TPs identified in R UT & LS which were addressed  with DN (upon further education of precautions for DN over  the lung fields), stretching and strengthening targeting scapular retraction - HEP provided for new stretches & strengthening exercises.    Rehab Potential  Good    PT Frequency  2x / week    PT Duration  4 weeks    PT Treatment/Interventions  Patient/family education;Therapeutic exercise;Therapeutic activities;Functional mobility training;Gait training;Stair training;Balance training;Manual techniques;Passive range of motion;Dry needling;Taping;Electrical Stimulation;Moist Heat;Ultrasound;Cryotherapy;Vasopneumatic Device;Iontophoresis 4mg /ml Dexamethasone;ADLs/Self Care Home Management    PT Next Visit Plan  core/proximal LE strengthening; manual therapy including possible DN for abnormal muscle tension; modalities PRN    Consulted and Agree with Plan of Care  Patient       Patient will benefit from skilled therapeutic intervention in order to improve the following deficits and impairments:  Pain, Hypomobility, Impaired flexibility, Increased muscle spasms, Increased fascial restricitons, Decreased range of motion, Decreased strength, Decreased mobility, Difficulty walking, Abnormal gait, Decreased activity tolerance, Decreased balance  Visit Diagnosis: 1. Pain in right leg   2. Cramp and spasm   3. Other symptoms and signs involving the musculoskeletal system   4. Muscle weakness (generalized)        Problem List Patient Active Problem List   Diagnosis Date Noted  . Hot flashes 05/21/2018  . Chest pain 11/17/2016  . Status post gastric bypass for obesity 03/07/2013  . Varicose veins of lower extremities with other complications 56/38/9373    Percival Spanish, PT, MPT 05/27/2019, 10:04 AM  [facility] [address]  [place], [place], [address] Phone: [phone]   Fax:  [phone]  Name: Kristie Martinez MRN: [id number] Date of Birth: 1961-03-07

## 2019-05-30 ENCOUNTER — Encounter: Payer: Self-pay | Admitting: Physical Therapy

## 2019-05-30 ENCOUNTER — Ambulatory Visit: Payer: PRIVATE HEALTH INSURANCE | Admitting: Physical Therapy

## 2019-05-30 ENCOUNTER — Other Ambulatory Visit: Payer: Self-pay

## 2019-05-30 DIAGNOSIS — M79604 Pain in right leg: Secondary | ICD-10-CM | POA: Diagnosis not present

## 2019-05-30 DIAGNOSIS — R29898 Other symptoms and signs involving the musculoskeletal system: Secondary | ICD-10-CM

## 2019-05-30 DIAGNOSIS — M6281 Muscle weakness (generalized): Secondary | ICD-10-CM

## 2019-05-30 DIAGNOSIS — R252 Cramp and spasm: Secondary | ICD-10-CM

## 2019-05-30 NOTE — Therapy (Signed)
[facility] [address]  [address] [place], [place], [address] Phone: [phone]   Fax:  [phone]  Physical Therapy Treatment  Patient Details  Name: Kristie RouteDenise E Demos MRN: [id number] Date of Birth: 11-02-1961 Referring Provider (PT): Herminio HeadsShawn Dalton-Bethea, MD   Encounter Date: 05/30/2019  PT End of Session - 05/30/19 1635    Visit Number  6    Number of Visits  13    Authorization Type  WC - 1 Eval + 8 Treat, 4 additional visits approved as of 05/27/19    Authorization - Visit Number  6    Authorization - Number of Visits  13    PT Start Time  1635    PT Stop Time  1752    PT Time Calculation (min)  77 min    Activity Tolerance  Patient tolerated treatment well    Behavior During Therapy  [facility] HealthcareWFL for tasks assessed/performed       Past Medical History:  Diagnosis Date  . Indigestion   . Post-operative nausea and vomiting   . Varicose veins     Past Surgical History:  Procedure Laterality Date  . CERVICAL DISC ARTHROPLASTY    . ENDOMETRIAL ABLATION    . ENDOVENOUS ABLATION SAPHENOUS VEIN W/ LASER  09-27-2012   left greater saphenous vein  by Gretta Beganodd Early MD  . ENDOVENOUS ABLATION SAPHENOUS VEIN W/ LASER  11-08-2012   right greater saphenous vein   by Gretta Beganodd Early MD  . mini gastric bypass   2004  . OTHER SURGICAL HISTORY  2004   mini gastric bypass  . TONSILLECTOMY    . TONSILLECTOMY AND ADENOIDECTOMY  1970    There were no vitals filed for this visit.  Subjective Assessment - 05/30/19 1641    Subjective  Pt reporting she can barely feel the pain anymore - only a little ache. Feels DN has really helped. Still gets the beginning of a cramp when doing clams with HEP but it goes away if she takes a break during the exercises.    Patient Stated Goals  "refamiliarize myself with exercises and see if I can get the pain better managed"    Currently in Pain?  Yes    Pain Score  4    3-4/10   Pain Location  Leg    Pain  Orientation  Right;Upper;Medial;Posterior    Pain Descriptors / Indicators  Aching                       [facility] Adult PT Treatment/Exercise - 05/30/19 1635      Self-Care   Self-Care  Other Self-Care Comments    Other Self-Care Comments   Clarified frequeny for HEP stretches vs strengthening exercises.      Exercises   Exercises  Knee/Hip      Knee/Hip Exercises: Aerobic   Recumbent Bike  L2 x 6 min      Modalities   Modalities  Electrical Stimulation;Moist Heat      Moist Heat Therapy   Number Minutes Moist Heat  15 Minutes    Moist Heat Location  Shoulder;Cervical;Other (comment)   R hamstrings/adductors     Programme researcher, broadcasting/film/videolectrical Stimulation   Electrical Stimulation Location  B UT/LS & R hip adductors/medial HS    Electrical Stimulation Action  Pre-mod    Electrical Stimulation Parameters  intensity to pt tolerance x15 min    Electrical Stimulation Goals  Pain;Tone      Manual  Therapy   Manual Therapy  Soft tissue mobilization;Myofascial release;Other (comment)    Manual therapy comments  patient prone & supine    Soft tissue mobilization  STM to R medial HS muscle groups with emphasis on mid/distal muscle belly; R gracilis & hip adductors, B UT & LS, L infraspinatus    Myofascial Release  manual TPR and pin & stretch to R medial HS, gracilis & distal hip adductors, manual TPR to B UT/LS & L infrapsinatus    Other Manual Therapy  Clarified instruction in use of cane for self TPR to UT/LS to make sure to maintain pressure from cane for 30-60 sec after feeling of release to avoid rebound tenderness       Trigger Point Dry Needling - 05/30/19 1635    Consent Given?  Yes    Muscles Treated Head and Neck  Upper trapezius;Levator scapulae    Muscles Treated Upper Quadrant  Infraspinatus    Muscles Treated Lower Quadrant  Hamstring;Adductor longus/brevis/magnus    Upper Trapezius Response  Twitch reponse elicited;Palpable increased muscle length   Left   Levator Scapulae  Response  Twitch response elicited;Palpable increased muscle length   Left   Infraspinatus Response  Twitch response elicited;Palpable increased muscle length   Left   Adductor Response  Twitch response elicited;Palpable increased muscle length   R adductor magnus/longus & gracilis   Hamstring Response  Twitch response elicited;Palpable increased muscle length   R distal medial HS               PT Long Term Goals - 05/13/19 1707      PT LONG TERM GOAL #1   Title  Independent with ongoing HEP    Status  On-going    Target Date  06/20/19      PT LONG TERM GOAL #2   Title  Patient to improve R HS flexibility to [facility] or symmetrical of L LE without pain limiting    Status  On-going    Target Date  06/20/19      PT LONG TERM GOAL #3   Title  Patient to improve R LE strength to >/= 4/5 w/o increased pain    Status  On-going    Target Date  06/20/19      PT LONG TERM GOAL #4   Title  Patient will report ability to walk at speed sufficient to get her HR up for exercise w/o feeling of imbalance or increased risk for falls    Status  On-going    Target Date  06/20/19            Plan - 05/30/19 1750    Clinical Impression Statement  Langley Gauss reporting overall benefit from PT with lessening pain but feels frustrated that as hamstring and medial thigh improves, new pain presents in other areas such upper shoulders and foot - reports she mentioned this to the MD and case manager and states they are ok with PT addressing these  issues in addition to the initial referral. She reports leg was doing really well until she went for a longer walk last evening and now notes distal medial thigh hurting again today. Also notes good relief from working on R upper shoulder last visit, but states last night her L shoulder prevented her from sleeping well. Addressed all areas with manual therapy including DN today with patient noting good relief. Also clarified proper techniques for self-STM at  home as well as recommended frequencies for HEP, targeting stretching daily and  strengthening qod to allow for muscle recovery between strengthening sessions. Treatment concluded with estim and moist heat to promote further muscle relaxation and pain reduction.    Rehab Potential  Good    PT Frequency  2x / week    PT Duration  4 weeks    PT Treatment/Interventions  Patient/family education;Therapeutic exercise;Therapeutic activities;Functional mobility training;Gait training;Stair training;Balance training;Manual techniques;Passive range of motion;Dry needling;Taping;Electrical Stimulation;Moist Heat;Ultrasound;Cryotherapy;Vasopneumatic Device;Iontophoresis 4mg /ml Dexamethasone;ADLs/Self Care Home Management    PT Next Visit Plan  core/proximal LE strengthening; manual therapy including possible DN for abnormal muscle tension; modalities PRN    Consulted and Agree with Plan of Care  Patient       Patient will benefit from skilled therapeutic intervention in order to improve the following deficits and impairments:  Pain, Hypomobility, Impaired flexibility, Increased muscle spasms, Increased fascial restricitons, Decreased range of motion, Decreased strength, Decreased mobility, Difficulty walking, Abnormal gait, Decreased activity tolerance, Decreased balance  Visit Diagnosis: 1. Pain in right leg   2. Cramp and spasm   3. Other symptoms and signs involving the musculoskeletal system   4. Muscle weakness (generalized)        Problem List Patient Active Problem List   Diagnosis Date Noted  . Hot flashes 05/21/2018  . Chest pain 11/17/2016  . Status post gastric bypass for obesity 03/07/2013  . Varicose veins of lower extremities with other complications 05/17/2012    Marry GuanJoAnne M Ora Bollig, PT, MPT 05/30/2019, 6:10 PM  [facility] [place] [address]  [address] [place], [place], [address] Phone: [phone]   Fax:  [phone]  Name: Kristie RouteDenise  E Raborn MRN: [id number] Date of Birth: December 27, 1960

## 2019-06-05 ENCOUNTER — Other Ambulatory Visit: Payer: Self-pay

## 2019-06-05 ENCOUNTER — Ambulatory Visit: Payer: PRIVATE HEALTH INSURANCE

## 2019-06-05 DIAGNOSIS — R29898 Other symptoms and signs involving the musculoskeletal system: Secondary | ICD-10-CM

## 2019-06-05 DIAGNOSIS — M6281 Muscle weakness (generalized): Secondary | ICD-10-CM

## 2019-06-05 DIAGNOSIS — M79604 Pain in right leg: Secondary | ICD-10-CM | POA: Diagnosis not present

## 2019-06-05 DIAGNOSIS — R252 Cramp and spasm: Secondary | ICD-10-CM

## 2019-06-05 NOTE — Therapy (Signed)
[facility] [place] [address]  [address] [place], [place], [address] Phone: [phone]   Fax:  [phone]  Physical Therapy Treatment  Patient Details  Name: Kristie Martinez MRN: [id number] Date of Birth: 05/02/1961 Referring Provider (PT): Herminio HeadsShawn Dalton-Bethea, MD   Encounter Date: 06/05/2019  PT End of Session - 06/05/19 0818    Visit Number  7    Number of Visits  13    Authorization Type  WC - 1 Eval + 8 Treat, 4 additional visits approved as of 05/27/19    Authorization - Visit Number  7    Authorization - Number of Visits  13    PT Start Time  0810    PT Stop Time  0858    PT Time Calculation (min)  48 min    Activity Tolerance  Patient tolerated treatment well    Behavior During Therapy  [facility] for tasks assessed/performed       Past Medical History:  Diagnosis Date  . Indigestion   . Post-operative nausea and vomiting   . Varicose veins     Past Surgical History:  Procedure Laterality Date  . CERVICAL DISC ARTHROPLASTY    . ENDOMETRIAL ABLATION    . ENDOVENOUS ABLATION SAPHENOUS VEIN W/ LASER  09-27-2012   left greater saphenous vein  by Gretta Beganodd Early MD  . ENDOVENOUS ABLATION SAPHENOUS VEIN W/ LASER  11-08-2012   right greater saphenous vein   by Gretta Beganodd Early MD  . mini gastric bypass   2004  . OTHER SURGICAL HISTORY  2004   mini gastric bypass  . TONSILLECTOMY    . TONSILLECTOMY AND ADENOIDECTOMY  1970    There were no vitals filed for this visit.  Subjective Assessment - 06/05/19 0815    Subjective  Pt. noting she had R HS spasm while getting in pool yesterday. Does feel like upper shoulders have improved since last visit.    Patient Stated Goals  "refamiliarize myself with exercises and see if I can get the pain better managed"    Currently in Pain?  Yes    Pain Score  2    Pt. noting 10/10 R HS pain/spasm yesterday   Pain Location  Leg    Pain Orientation  Right;Posterior    Pain Type  Chronic pain    Multiple Pain Sites  Yes    Pain Score  3    Pain Location  Hip   L lateral glute   Pain Orientation  Left    Pain Descriptors / Indicators  Aching                       [facility] Adult PT Treatment/Exercise - 06/05/19 0001      Knee/Hip Exercises: Stretches   Passive Hamstring Stretch  Right;Left    Passive Hamstring Stretch Limitations  supine; strap     Piriformis Stretch  Right;Left;2 reps;30 seconds    Piriformis Stretch Limitations  supine KTOS      Knee/Hip Exercises: Aerobic   Recumbent Bike  L2 x 6 min      Knee/Hip Exercises: Seated   Hamstring Curl  Right;Left;15 reps;Strengthening    Hamstring Limitations  red TB seated in chair + airex pad      Knee/Hip Exercises: Supine   Bridges  Both;15 reps;Strengthening    Bridges Limitations  + red TB hip abduction isometric    Other Supine Knee/Hip Exercises  Hooklying B clam shell with  red looped TB at knees x 10 reps       Moist Heat Therapy   Number Minutes Moist Heat  10 Minutes    Moist Heat Location  Hip   L lateral glutes     Electrical Stimulation   Electrical Stimulation Location  B lateral glutes    Electrical Stimulation Action  Pre-mod     Electrical Stimulation Parameters  intensity to pt. tolerance, 10'    Electrical Stimulation Goals  Pain;Tone      Manual Therapy   Manual Therapy  Soft tissue mobilization;Other (comment)    Manual therapy comments  B sidelying with LE resting on bolster     Soft tissue mobilization  STM to B lateral glutes in area of tenderness                  PT Long Term Goals - 05/13/19 1707      PT LONG TERM GOAL #1   Title  Independent with ongoing HEP    Status  On-going    Target Date  06/20/19      PT LONG TERM GOAL #2   Title  Patient to improve R HS flexibility to [facility] or symmetrical of L LE without pain limiting    Status  On-going    Target Date  06/20/19      PT LONG TERM GOAL #3   Title  Patient to improve R LE strength to >/= 4/5 w/o  increased pain    Status  On-going    Target Date  06/20/19      PT LONG TERM GOAL #4   Title  Patient will report ability to walk at speed sufficient to get her HR up for exercise w/o feeling of imbalance or increased risk for falls    Status  On-going    Target Date  06/20/19            Plan - 06/05/19 0833    Clinical Impression Statement  Pt. noting her pain in upper shoulders has resolved.  Pt. primary complaint today was L lateral glute pain from incident yesterday with R HS "spasm" while getting into swimming pool.  Pt. noting "spasm" lasted ~ 30 min and she has had increased soreness since yesterday.  MT addressing B lateral glute tenderness today with some relief noted following and gentle proximal hip strengthening activities for normalization of tone.  Also reviewed HEP today with pt. encouraged to continue consistent performance of LE stretching and bridging at home with red band at knees.  Pt. verbalized understanding.  Ended session with E-stim/moist heat applied to B glutes for reduction in tone and pain.    Personal Factors and Comorbidities  Time since onset of injury/illness/exacerbation;Past/Current Experience    Rehab Potential  Good    PT Treatment/Interventions  Patient/family education;Therapeutic exercise;Therapeutic activities;Functional mobility training;Gait training;Stair training;Balance training;Manual techniques;Passive range of motion;Dry needling;Taping;Electrical Stimulation;Moist Heat;Ultrasound;Cryotherapy;Vasopneumatic Device;Iontophoresis 4mg /ml Dexamethasone;ADLs/Self Care Home Management    PT Next Visit Plan  core/proximal LE strengthening; manual therapy including possible DN for abnormal muscle tension; modalities PRN    Consulted and Agree with Plan of Care  Patient       Patient will benefit from skilled therapeutic intervention in order to improve the following deficits and impairments:  Pain, Hypomobility, Impaired flexibility, Increased muscle  spasms, Increased fascial restricitons, Decreased range of motion, Decreased strength, Decreased mobility, Difficulty walking, Abnormal gait, Decreased activity tolerance, Decreased balance  Visit Diagnosis: 1. Pain in right leg  2. Cramp and spasm   3. Other symptoms and signs involving the musculoskeletal system   4. Muscle weakness (generalized)        Problem List Patient Active Problem List   Diagnosis Date Noted  . Hot flashes 05/21/2018  . Chest pain 11/17/2016  . Status post gastric bypass for obesity 03/07/2013  . Varicose veins of lower extremities with other complications 05/17/2012    Kermit BaloMicah Oluwatomisin Deman, PTA 06/05/19 12:08 PM    [facility] [place] [address]  [address] [place], [place], [address] Phone: [phone]   Fax:  [phone]  Name: Kristie Martinez MRN: [id number] Date of Birth: Apr 15, 1961

## 2019-06-11 ENCOUNTER — Ambulatory Visit: Payer: PRIVATE HEALTH INSURANCE | Admitting: Physical Therapy

## 2019-06-11 ENCOUNTER — Other Ambulatory Visit: Payer: Self-pay

## 2019-06-11 ENCOUNTER — Encounter: Payer: Self-pay | Admitting: Physical Therapy

## 2019-06-11 DIAGNOSIS — M79604 Pain in right leg: Secondary | ICD-10-CM

## 2019-06-11 DIAGNOSIS — R29898 Other symptoms and signs involving the musculoskeletal system: Secondary | ICD-10-CM

## 2019-06-11 DIAGNOSIS — M6281 Muscle weakness (generalized): Secondary | ICD-10-CM

## 2019-06-11 DIAGNOSIS — R252 Cramp and spasm: Secondary | ICD-10-CM

## 2019-06-11 NOTE — Therapy (Signed)
[facility] [address]  [address] [place], [place], [address] Phone: [phone]   Fax:  [phone]  Physical Therapy Treatment  Patient Details  Name: Kristie Martinez MRN: [id number] Date of Birth: 1961/02/27 Referring Provider (PT): Herminio HeadsShawn Dalton-Bethea, MD   Encounter Date: 06/11/2019  PT End of Session - 06/11/19 0809    Visit Number  8    Number of Visits  13    Authorization Type  WC - 1 Eval + 8 Treat, 4 additional visits approved as of 05/27/19    Authorization - Visit Number  8    Authorization - Number of Visits  13    PT Start Time  0809   Pt arrived late   PT Stop Time  0911    PT Time Calculation (min)  62 min    Activity Tolerance  Patient tolerated treatment well    Behavior During Therapy  [facility]WFL for tasks assessed/performed       Past Medical History:  Diagnosis Date  . Indigestion   . Post-operative nausea and vomiting   . Varicose veins     Past Surgical History:  Procedure Laterality Date  . CERVICAL DISC ARTHROPLASTY    . ENDOMETRIAL ABLATION    . ENDOVENOUS ABLATION SAPHENOUS VEIN W/ LASER  09-27-2012   left greater saphenous vein  by Gretta Beganodd Early MD  . ENDOVENOUS ABLATION SAPHENOUS VEIN W/ LASER  11-08-2012   right greater saphenous vein   by Gretta Beganodd Early MD  . mini gastric bypass   2004  . OTHER SURGICAL HISTORY  2004   mini gastric bypass  . TONSILLECTOMY    . TONSILLECTOMY AND ADENOIDECTOMY  1970    There were no vitals filed for this visit.  Subjective Assessment - 06/11/19 0811    Subjective  Pt reporting good relief following manual and estim last visit, but then was leaning over the tub to wash her hair and had a severe cramp in her HS. Now also noting tighness in R calf. Also feels like using strap fro HS streches may be irritating her shoulder.    Patient Stated Goals  "refamiliarize myself with exercises and see if I can get the pain better managed"    Currently in Pain?  Yes    Pain  Score  0-No pain   2-3/10 to touch or when walking up hill   Pain Location  Leg    Pain Orientation  Right;Posterior;Upper;Lower    Pain Descriptors / Indicators  Burning   "pulling"   Pain Type  Chronic pain    Pain Frequency  Intermittent    Pain Score  4   3-4/10   Pain Location  Scapula    Pain Orientation  Right    Pain Descriptors / Indicators  Burning;Stabbing    Pain Type  Acute pain    Pain Frequency  Intermittent                       [facility] Adult PT Treatment/Exercise - 06/11/19 0809      Exercises   Exercises  Knee/Hip      Knee/Hip Exercises: Stretches   Passive Hamstring Stretch  Right;30 seconds;1 rep   each   Passive Hamstring Stretch Limitations  seated hip hinge with R LE extended + hip IR/ER to isolate medial & lateral HS; supine with foot propped on doorframe    Gastroc Stretch  Right;30 seconds;2 reps    Gastroc  Stretch Limitations  runner's stretch and standing with R LE extended & toes on baseboard    Soleus Stretch  Right;30 seconds;2 reps    Soleus Stretch Limitations  standing runner's stretch      Knee/Hip Exercises: Aerobic   Recumbent Bike  L3 x 6 min      Knee/Hip Exercises: Standing   Heel Raises  Both;20 reps;3 seconds;Right    Heel Raises Limitations  B con with R ecc lowering      Knee/Hip Exercises: Seated   Hamstring Curl  Right;15 reps;Strengthening    Hamstring Limitations  red TB - cues for slow pace esp on eccentric release      Knee/Hip Exercises: Sidelying   Clams  R clam x 15 - cues for slower pace      Knee/Hip Exercises: Prone   Other Prone Exercises  Quadruped - R fire hydrants x 10      Shoulder Exercises: Standing   Horizontal ABduction  Both;10 reps;Theraband;Strengthening    Theraband Level (Shoulder Horizontal ABduction)  Level 2 (Red)    Horizontal ABduction Limitations  standing against pool noodle on wall as tactile cue for scap retraction    External Rotation  Both;10 reps;Theraband;Strengthening     Theraband Level (Shoulder External Rotation)  Level 2 (Red)    External Rotation Limitations  standing against pool noodle on wall as tactile cue for scap retraction    Diagonals  Both;10 reps;Theraband;Strengthening    Theraband Level (Shoulder Diagonals)  Level 2 (Red)    Diagonals Limitations  standing against pool noodle on wall as tactile cue for scap retraction      Manual Therapy   Manual Therapy  Soft tissue mobilization;Myofascial release;Other (comment)    Manual therapy comments  patient prone     Soft tissue mobilization  STM to R calf    Myofascial Release  manual TPR to R medial & lateral gastroc    Other Manual Therapy  Provided instruction instruction in use of roller stick/rolling pin for gastroc/soleus sefl-STM             PT Education - 06/11/19 0915    Education provided  Yes    Education Details  HEP update - calf stretches, heel raises & HS curls with red TB    Person(s) Educated  Patient    Methods  Explanation;Demonstration;Handout    Comprehension  Verbalized understanding;Returned demonstration;Need further instruction          PT Long Term Goals - 05/13/19 1707      PT LONG TERM GOAL #1   Title  Independent with ongoing HEP    Status  On-going    Target Date  06/20/19      PT LONG TERM GOAL #2   Title  Patient to improve R HS flexibility to [facility] or symmetrical of L LE without pain limiting    Status  On-going    Target Date  06/20/19      PT LONG TERM GOAL #3   Title  Patient to improve R LE strength to >/= 4/5 w/o increased pain    Status  On-going    Target Date  06/20/19      PT LONG TERM GOAL #4   Title  Patient will report ability to walk at speed sufficient to get her HR up for exercise w/o feeling of imbalance or increased risk for falls    Status  On-going    Target Date  06/20/19  Plan - 06/11/19 0818    Clinical Impression Statement  Kristie Martinez reporting increased cramping and burning in R calf since the slip  where she pulled her HS climbing in the pool last week. Multiple TPs identified in medial > lateral R gastroc which were addressed with manual therapy and DN with positive twitch response elicited and palpable reduction in muscle tension. Manual therapy followed by stretching and strengthening exercises to promote further normalization of muscle tension with HEP updated accordingly. Kristie Martinez also requesting review/clarification of scapular strengthening exercises with cues provided to maintain neutral posture while promoting good scapular retraction. Also reviewed alternative options for HS stretch as she feels that holding the strap for the supine HS stretch may be irritating her scapular/shoulder pain.    Rehab Potential  Good    PT Frequency  --    PT Duration  --    PT Treatment/Interventions  Patient/family education;Therapeutic exercise;Therapeutic activities;Functional mobility training;Gait training;Stair training;Balance training;Manual techniques;Passive range of motion;Dry needling;Taping;Electrical Stimulation;Moist Heat;Ultrasound;Cryotherapy;Vasopneumatic Device;Iontophoresis 4mg /ml Dexamethasone;ADLs/Self Care Home Management    PT Next Visit Plan  core/proximal LE strengthening; manual therapy including possible DN for abnormal muscle tension; modalities PRN    Consulted and Agree with Plan of Care  Patient       Patient will benefit from skilled therapeutic intervention in order to improve the following deficits and impairments:  Pain, Hypomobility, Impaired flexibility, Increased muscle spasms, Increased fascial restricitons, Decreased range of motion, Decreased strength, Decreased mobility, Difficulty walking, Abnormal gait, Decreased activity tolerance, Decreased balance  Visit Diagnosis: 1. Pain in right leg   2. Cramp and spasm   3. Other symptoms and signs involving the musculoskeletal system   4. Muscle weakness (generalized)        Problem List Patient Active Problem  List   Diagnosis Date Noted  . Hot flashes 05/21/2018  . Chest pain 11/17/2016  . Status post gastric bypass for obesity 03/07/2013  . Varicose veins of lower extremities with other complications 05/17/2012    Marry GuanJoAnne M , PT, MPT 06/11/2019, 9:30 AM  [facility] [address]  [address] [place], [place], [address] Phone: [phone]   Fax:  [phone]  Name: Kristie Martinez MRN: [id number] Date of Birth: 1961/02/06

## 2019-06-13 ENCOUNTER — Encounter: Payer: Self-pay | Admitting: Physical Therapy

## 2019-06-13 ENCOUNTER — Encounter

## 2019-06-13 ENCOUNTER — Other Ambulatory Visit: Payer: Self-pay

## 2019-06-13 ENCOUNTER — Ambulatory Visit: Payer: PRIVATE HEALTH INSURANCE | Admitting: Physical Therapy

## 2019-06-13 DIAGNOSIS — R29898 Other symptoms and signs involving the musculoskeletal system: Secondary | ICD-10-CM

## 2019-06-13 DIAGNOSIS — M79604 Pain in right leg: Secondary | ICD-10-CM

## 2019-06-13 DIAGNOSIS — M6281 Muscle weakness (generalized): Secondary | ICD-10-CM

## 2019-06-13 DIAGNOSIS — R252 Cramp and spasm: Secondary | ICD-10-CM

## 2019-06-13 NOTE — Therapy (Signed)
[facility] [address]  [address] [place], [place], [address] Phone: [phone]   Fax:  [phone]  Physical Therapy Treatment  Patient Details  Name: Kristie Martinez MRN: [id number] Date of Birth: 1961/07/07 Referring Provider (PT): Herminio HeadsShawn Dalton-Bethea, MD   Encounter Date: 06/13/2019  PT End of Session - 06/13/19 0852    Visit Number  9    Number of Visits  13    Authorization Type  WC - 1 Eval + 8 Treat, 4 additional visits approved as of 05/27/19    Authorization - Visit Number  9    Authorization - Number of Visits  13    PT Start Time  0852   Pt arrived late   PT Stop Time  0934    PT Time Calculation (min)  42 min    Activity Tolerance  Patient tolerated treatment well    Behavior During Therapy  [facility] for tasks assessed/performed       Past Medical History:  Diagnosis Date  . Indigestion   . Post-operative nausea and vomiting   . Varicose veins     Past Surgical History:  Procedure Laterality Date  . CERVICAL DISC ARTHROPLASTY    . ENDOMETRIAL ABLATION    . ENDOVENOUS ABLATION SAPHENOUS VEIN W/ LASER  09-27-2012   left greater saphenous vein  by Gretta Beganodd Early MD  . ENDOVENOUS ABLATION SAPHENOUS VEIN W/ LASER  11-08-2012   right greater saphenous vein   by Gretta Beganodd Early MD  . mini gastric bypass   2004  . OTHER SURGICAL HISTORY  2004   mini gastric bypass  . TONSILLECTOMY    . TONSILLECTOMY AND ADENOIDECTOMY  1970    There were no vitals filed for this visit.  Subjective Assessment - 06/13/19 0853    Subjective  Pt reporting no further leg cramps since last visit. Feels like she is getting more back to normal over the past 2 days but still feels limited with walking due to R plantar fascia pain.    Patient Stated Goals  "refamiliarize myself with exercises and see if I can get the pain better managed"    Currently in Pain?  Yes    Pain Score  0-No pain    Pain Location  Leg    Pain Score  5   4-5/10   Pain Location  Scapula    Pain Orientation  Right    Pain Descriptors / Indicators  Burning;Stabbing    Pain Score  4   3-4/10   Pain Location  Heel    Pain Orientation  Right;Lower    Pain Descriptors / Indicators  Burning;Stabbing;Throbbing   "walking bone on bone"   Pain Type  Acute pain;Chronic pain    Pain Frequency  Intermittent    Aggravating Factors   walking/standing w/o shoes    Pain Relieving Factors  suporttive shoes or inserts    Effect of Pain on Daily Activities  limits walking tolerance, esp w/o shoes                       [facility] Adult PT Treatment/Exercise - 06/13/19 0001      Knee/Hip Exercises: Aerobic   Nustep  L5 x 6 min - LE only      Modalities   Modalities  Ultrasound      Ultrasound   Ultrasound Location  R plantart fascia    Ultrasound Parameters  3.3Hz , 50% pulsed, 1.0 W/cm2  x 8 min    Ultrasound Goals  Pain;Edema      Manual Therapy   Manual Therapy  Soft tissue mobilization;Myofascial release;Passive ROM;Taping;Other (comment)    Soft tissue mobilization  STM & IASTM to R plantar fascia    Myofascial Release  pin & stretch to R plantar fascia    Passive ROM  manual R plantar fascia stretch    Other Manual Therapy  Provided instruction in use of golf/tennis ball and /or frozen water bottle for self-STM to R plantar fasica.    Kinesiotex  Inhibit Muscle      Kinesiotix   Inhibit Muscle   R plantar fascia - 50%      Ankle Exercises: Stretches   Plantar Fascia Stretch  30 seconds;3 reps                  PT Long Term Goals - 05/13/19 1707      PT LONG TERM GOAL #1   Title  Independent with ongoing HEP    Status  On-going    Target Date  06/20/19      PT LONG TERM GOAL #2   Title  Patient to improve R HS flexibility to [facility] or symmetrical of L LE without pain limiting    Status  On-going    Target Date  06/20/19      PT LONG TERM GOAL #3   Title  Patient to improve R LE strength to >/= 4/5 w/o increased pain     Status  On-going    Target Date  06/20/19      PT LONG TERM GOAL #4   Title  Patient will report ability to walk at speed sufficient to get her HR up for exercise w/o feeling of imbalance or increased risk for falls    Status  On-going    Target Date  06/20/19            Plan - 06/13/19 0900    Clinical Impression Statement  Kristie Martinez reporting good relief from manual and DN leg last session with pain mostly resolved. 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 Increased muscle tension with crepitus present in R quadratus plantae with ttp over calcaneal tuberosity which was addressed with [place] followed by STM/IASTM and MFR, concluding with kinesiotaping to R plantar fascia. Reviewed self-stretching and STM using golf/tennis ball and/or iced water bottle to promote ongoing reduction in pain and muscle tension.    Rehab Potential  Good    PT Treatment/Interventions  Patient/family education;Therapeutic exercise;Therapeutic activities;Functional mobility training;Gait training;Stair training;Balance training;Manual techniques;Passive range of motion;Dry needling;Taping;Electrical Stimulation;Moist Heat;Ultrasound;Cryotherapy;Vasopneumatic Device;Iontophoresis 4mg /ml Dexamethasone;ADLs/Self Care Home Management    PT Next Visit Plan  core/proximal LE strengthening; manual therapy including possible DN for abnormal muscle tension; modalities PRN    Consulted and Agree with Plan of Care  Patient       Patient will benefit from skilled therapeutic intervention in order to improve the following deficits and impairments:  Pain, Hypomobility, Impaired flexibility, Increased muscle spasms, Increased fascial restricitons, Decreased range of motion, Decreased strength, Decreased mobility, Difficulty walking, Abnormal gait, Decreased activity tolerance, Decreased balance  Visit Diagnosis: 1.  Pain in right leg   2. Cramp and spasm   3. Other symptoms and signs involving the musculoskeletal system   4. Muscle weakness (generalized)        Problem List Patient Active  Problem List   Diagnosis Date Noted  . Hot flashes 05/21/2018  . Chest pain 11/17/2016  . Status post gastric bypass for obesity 03/07/2013  . Varicose veins of lower extremities with other complications 05/17/2012    Marry GuanJoAnne M , PT, MPT 06/13/2019, 4:59 PM  [facility] [address]  [address] [place], [place], [address] Phone: [phone]   Fax:  [phone]  Name: Kristie Martinez MRN: [id number] Date of Birth: July 18, 1961

## 2019-06-14 ENCOUNTER — Ambulatory Visit: Payer: PRIVATE HEALTH INSURANCE | Admitting: Physical Therapy

## 2019-06-27 ENCOUNTER — Ambulatory Visit: Payer: PRIVATE HEALTH INSURANCE | Admitting: Physical Therapy

## 2019-07-02 ENCOUNTER — Ambulatory Visit: Payer: PRIVATE HEALTH INSURANCE | Admitting: Physical Therapy

## 2019-07-09 ENCOUNTER — Ambulatory Visit: Payer: PRIVATE HEALTH INSURANCE | Attending: Physical Medicine and Rehabilitation | Admitting: Physical Therapy

## 2019-07-09 ENCOUNTER — Other Ambulatory Visit: Payer: Self-pay

## 2019-07-09 ENCOUNTER — Encounter: Payer: Self-pay | Admitting: Physical Therapy

## 2019-07-09 DIAGNOSIS — R252 Cramp and spasm: Secondary | ICD-10-CM

## 2019-07-09 DIAGNOSIS — M79672 Pain in left foot: Secondary | ICD-10-CM | POA: Diagnosis present

## 2019-07-09 DIAGNOSIS — R29898 Other symptoms and signs involving the musculoskeletal system: Secondary | ICD-10-CM | POA: Diagnosis present

## 2019-07-09 DIAGNOSIS — M6281 Muscle weakness (generalized): Secondary | ICD-10-CM | POA: Diagnosis present

## 2019-07-09 DIAGNOSIS — M79604 Pain in right leg: Secondary | ICD-10-CM | POA: Diagnosis not present

## 2019-07-09 DIAGNOSIS — M79602 Pain in left arm: Secondary | ICD-10-CM | POA: Diagnosis present

## 2019-07-09 NOTE — Therapy (Signed)
[facility] [address]  [place], [place], [address] Phone: [phone]   Fax:  [phone]  Physical Therapy Treatment  Patient Details  Name: Kristie Martinez MRN: [id number] Date of Birth: 1961-03-17 Referring Provider (PT): Neomia Dear, MD   Encounter Date: 07/09/2019  PT End of Session - 07/09/19 1708    Visit Number  10    Number of Visits  13    Authorization Type  WC - 1 Eval + 8 Treat, 4 additional visits approved as of 05/27/19    Authorization - Visit Number  10    Authorization - Number of Visits  13    PT Start Time  3244   Pt arrived late   PT Stop Time  1802    PT Time Calculation (min)  54 min    Activity Tolerance  Patient tolerated treatment well    Behavior During Therapy  [facility] for tasks assessed/performed       Past Medical History:  Diagnosis Date  . Indigestion   . Post-operative nausea and vomiting   . Varicose veins     Past Surgical History:  Procedure Laterality Date  . CERVICAL DISC ARTHROPLASTY    . ENDOMETRIAL ABLATION    . ENDOVENOUS ABLATION SAPHENOUS VEIN W/ LASER  09-27-2012   left greater saphenous vein  by Curt Jews MD  . ENDOVENOUS ABLATION SAPHENOUS VEIN W/ LASER  11-08-2012   right greater saphenous vein   by Curt Jews MD  . mini gastric bypass   2004  . OTHER SURGICAL HISTORY  2004   mini gastric bypass  . TONSILLECTOMY    . TONSILLECTOMY AND ADENOIDECTOMY  1970    There were no vitals filed for this visit.  Subjective Assessment - 07/09/19 1711    Subjective  Pt returning to PT after extended absence due to vacation and self-isolation after contracting COVID-19. Pt w/o COVID-19 symptoms for >10 days and cleared to return to work by [employer] at [employer] last week. Pt reporting her leg gave way [place], causing her to fall. Another fall occurred when leg gave way while walking up stairs. Sensory testing completed by MD revealing sensory  loss in L5 distribution of R LE. Pt stating MD would like PT to try DN for this.    Patient Stated Goals  "refamiliarize myself with exercises and see if I can get the pain better managed"    Currently in Pain?  Yes    Pain Score  2     Pain Location  Leg    Pain Orientation  Right;Posterior;Upper    Pain Descriptors / Indicators  Aching    Pain Score  0    Pain Location  Scapula    Pain Score  2   up to 4-5/10 at times   Pain Location  Heel    Pain Orientation  Right;Lower                       [facility] Adult PT Treatment/Exercise - 07/09/19 1708      Knee/Hip Exercises: Stretches   Gastroc Stretch  Right;30 seconds;2 reps    Gastroc Stretch Limitations  runner's stretch and standing with R LE extended & toes on baseboard      Manual Therapy   Manual Therapy  Soft tissue mobilization;Myofascial release    Manual therapy comments  prone    Soft tissue mobilization  STM to  lumbar paraspinals, R HS & gastroc    Myofascial Release  pin & stretch to R lumbar parapsinals and R lateral HS; manual TPR to R lateral HS & medial gastroc       Trigger Point Dry Needling - 07/09/19 1708    Consent Given?  Yes    Muscles Treated Lower Quadrant  Hamstring;Gastrocnemius   Rt   Muscles Treated Back/Hip  Erector spinae;Lumbar multifidi    Hamstring Response  Twitch response elicited;Palpable increased muscle length   R proximal lateral HS   Gastrocnemius Response  Twitch response elicited;Palpable increased muscle length   R medial gastroc   Erector spinae Response  Twitch response elicited;Palpable increased muscle length   Rt lumbar   Lumbar multifidi Response  Twitch response elicited;Palpable increased muscle length   L4 - L5               PT Long Term Goals - 07/09/19 1836      PT LONG TERM GOAL #1   Title  Independent with ongoing HEP    Status  Partially Met      PT LONG TERM GOAL #2   Title  Patient to improve R HS flexibility to [facility] or symmetrical of  L LE without pain limiting    Status  On-going      PT LONG TERM GOAL #3   Title  Patient to improve R LE strength to >/= 4/5 w/o increased pain    Status  On-going      PT LONG TERM GOAL #4   Title  Patient will report ability to walk at speed sufficient to get her HR up for exercise w/o feeling of imbalance or increased risk for falls    Status  On-going            Plan - 07/09/19 1802    Clinical Impression Statement  [name] reporting 2 recent falls due to R LE buckling/"giving way" and states MD testing revealing R LE paresthesia in L5 nerve distribution. Increased muscle tension evident in R lumbar paraspinals with ttp at L4-S1 level along with continued TPs and/or taut bands in R proximal lateral HS and medial gastroc which were addressed with DN and manual therapy. Positive twitch responses elicited with palpable reduction in muscle tension and decreased ttp following treatment. Reviewed relevant stretches to promote further reduction in muscle tension. Will plan for goal reassessment with POC update as indicated next visit.    Rehab Potential  Good    PT Treatment/Interventions  Patient/family education;Therapeutic exercise;Therapeutic activities;Functional mobility training;Gait training;Stair training;Balance training;Manual techniques;Passive range of motion;Dry needling;Taping;Electrical Stimulation;Moist Heat;Ultrasound;Cryotherapy;Vasopneumatic Device;Iontophoresis 53m/ml Dexamethasone;ADLs/Self Care Home Management    PT Next Visit Plan  goal assessment; core/proximal LE strengthening; manual therapy including possible DN for abnormal muscle tension; modalities PRN    Consulted and Agree with Plan of Care  Patient       Patient will benefit from skilled therapeutic intervention in order to improve the following deficits and impairments:  Pain, Hypomobility, Impaired flexibility, Increased muscle spasms, Increased fascial restricitons, Decreased range of motion, Decreased  strength, Decreased mobility, Difficulty walking, Abnormal gait, Decreased activity tolerance, Decreased balance  Visit Diagnosis: 1. Pain in right leg   2. Cramp and spasm   3. Other symptoms and signs involving the musculoskeletal system   4. Muscle weakness (generalized)        Problem List Patient Active Problem List   Diagnosis Date Noted  . Hot flashes 05/21/2018  . Chest pain 11/17/2016  .  Status post gastric bypass for obesity 03/07/2013  . Varicose veins of lower extremities with other complications 41/79/1995    Percival Spanish 07/09/2019, 6:51 PM  [facility] [address]  [place], [place], [address] Phone: [phone]   Fax:  [phone]  Name: Kristie Martinez MRN: [id number] Date of Birth: [birth year]

## 2019-07-12 DIAGNOSIS — H02055 Trichiasis without entropian left lower eyelid: Secondary | ICD-10-CM | POA: Diagnosis not present

## 2019-07-12 DIAGNOSIS — H5213 Myopia, bilateral: Secondary | ICD-10-CM | POA: Diagnosis not present

## 2019-07-12 DIAGNOSIS — H02052 Trichiasis without entropian right lower eyelid: Secondary | ICD-10-CM | POA: Diagnosis not present

## 2019-07-17 ENCOUNTER — Encounter: Payer: Self-pay | Admitting: Physical Therapy

## 2019-07-17 ENCOUNTER — Ambulatory Visit: Payer: PRIVATE HEALTH INSURANCE | Admitting: Physical Therapy

## 2019-07-17 ENCOUNTER — Other Ambulatory Visit: Payer: Self-pay

## 2019-07-17 DIAGNOSIS — M79604 Pain in right leg: Secondary | ICD-10-CM | POA: Diagnosis not present

## 2019-07-17 DIAGNOSIS — M6281 Muscle weakness (generalized): Secondary | ICD-10-CM

## 2019-07-17 DIAGNOSIS — M79602 Pain in left arm: Secondary | ICD-10-CM

## 2019-07-17 DIAGNOSIS — R29898 Other symptoms and signs involving the musculoskeletal system: Secondary | ICD-10-CM

## 2019-07-17 DIAGNOSIS — R252 Cramp and spasm: Secondary | ICD-10-CM

## 2019-07-17 DIAGNOSIS — M79672 Pain in left foot: Secondary | ICD-10-CM

## 2019-07-17 NOTE — Therapy (Signed)
[facility] [address]  [place], [place], [address] Phone: [phone]   Fax:  [phone]  Physical Therapy Treatment  Patient Details  Name: Kristie Martinez MRN: [id number] Date of Birth: February 27, 1961 Referring Provider (PT): Neomia Dear, MD   Encounter Date: 07/17/2019  PT End of Session - 07/17/19 0938    Visit Number  11    Number of Visits  13    Authorization Type  WC - 1 Eval + 8 Treat, 4 additional visits approved as of 05/27/19    Authorization - Visit Number  11    Authorization - Number of Visits  13    PT Start Time  [phone]    PT Stop Time  1019    PT Time Calculation (min)  41 min    Activity Tolerance  Patient tolerated treatment well    Behavior During Therapy  [facility] for tasks assessed/performed       Past Medical History:  Diagnosis Date  . Indigestion   . Post-operative nausea and vomiting   . Varicose veins     Past Surgical History:  Procedure Laterality Date  . CERVICAL DISC ARTHROPLASTY    . ENDOMETRIAL ABLATION    . ENDOVENOUS ABLATION SAPHENOUS VEIN W/ LASER  09-27-2012   left greater saphenous vein  by Curt Jews MD  . ENDOVENOUS ABLATION SAPHENOUS VEIN W/ LASER  11-08-2012   right greater saphenous vein   by Curt Jews MD  . mini gastric bypass   2004  . OTHER SURGICAL HISTORY  2004   mini gastric bypass  . TONSILLECTOMY    . TONSILLECTOMY AND ADENOIDECTOMY  1970    There were no vitals filed for this visit.  Subjective Assessment - 07/17/19 0941    Subjective  Pt reporting no cramping or falls since last week. Able to dance at a wedding over the weekend with increased heel pain afterwards. 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 States she asked Dr. Ace Gins about DN for her L forearm/elbow due to strain from trying to catch herself during one of her falls and new order sent  for this.    Patient Stated Goals  "refamiliarize myself with exercises and see if I can get the pain better managed"    Pain Score  2    1-2/10   Pain Location  Leg    Pain Orientation  Right;Upper;Anterior;Posterior;Medial    Pain Descriptors / Indicators  Aching    Pain Score  4   3-4/10   Pain Location  Elbow    Pain Orientation  Left    Pain Descriptors / Indicators  Throbbing    Pain Type  Acute pain    Pain Score  4    Pain Location  Heel    Pain Orientation  Right    Pain Descriptors / Indicators  Throbbing                       [facility] Adult PT Treatment/Exercise - 07/17/19 2122      Knee/Hip Exercises: Aerobic   Recumbent Bike  L2 x 6 min      Wrist Exercises   Wrist Extension  Left;10 reps;Seated;AROM;AAROM;Bar weights/barbell    Bar Weights/Barbell (Wrist Extension)  1 lb    Wrist Extension Limitations  AAROM concentric with slow AROM  eccentric    Other wrist exercises  L wrist extensor stretch 2 x 30 sec    Other wrist exercises  L wrist flexor stretch x 30 sec      Manual Therapy   Manual Therapy  Soft tissue mobilization;Myofascial release;Taping    Soft tissue mobilization  STM to L wrist extensor group    Myofascial Release  manual TPR and pin & stretch to L wrist extensor group    Kinesiotex  Inhibit Muscle      Kinesiotix   Inhibit Muscle   L wrist extensor group - 25-30% Y strip from radial styloid process with tails surrounding extensor muscle group       Trigger Point Dry Needling - 07/17/19 0938    Consent Given?  Yes    Muscles Treated Wrist/Hand  Extensor carpi radialis longus/brevis;Extensor digitorum;Extensor carpi ulnaris   Left   Extensor carpi radialis longus/brevis Response  Twitch response elicited;Palpable increased muscle length    Extensor digitorum Response  Twitch response elicited;Palpable increased muscle length    Extensor carpi ulnaris Response  Twitch response elicited;Palpable increased muscle length            PT Education - 07/17/19 1019    Education provided  Yes    Education Details  HEP update - wrist extensor group stretches & strengthening exercises; taping instructions    Person(s) Educated  Patient    Methods  Explanation;Demonstration;Handout    Comprehension  Verbalized understanding;Returned demonstration          PT Long Term Goals - 07/09/19 1836      PT LONG TERM GOAL #1   Title  Independent with ongoing HEP    Status  Partially Met      PT LONG TERM GOAL #2   Title  Patient to improve R HS flexibility to [facility] or symmetrical of L LE without pain limiting    Status  On-going      PT LONG TERM GOAL #3   Title  Patient to improve R LE strength to >/= 4/5 w/o increased pain    Status  On-going      PT LONG TERM GOAL #4   Title  Patient will report ability to walk at speed sufficient to get her HR up for exercise w/o feeling of imbalance or increased risk for falls    Status  On-going            Plan - 07/17/19 1019    Clinical Impression Statement  Assessed and treated apparent L wrist extensor group strain from recent fall where she tried to catch herself when her R leg gave way climbing stairs. DN incorporated on MD order with positive twitch response and palpable reduction in muscle tension. Patient instructed in relevant stretches and home exercises to promote further normalization of muscle tension. Taping also applied to reduce muscle strain and allow for pain reduction. Will plan to resume focus on glute/HS on next visit.    Rehab Potential  Good    PT Treatment/Interventions  Patient/family education;Therapeutic exercise;Therapeutic activities;Functional mobility training;Gait training;Stair training;Balance training;Manual techniques;Passive range of motion;Dry needling;Taping;Electrical Stimulation;Moist Heat;Ultrasound;Cryotherapy;Vasopneumatic Device;Iontophoresis 59m/ml Dexamethasone;ADLs/Self Care Home Management    PT Next Visit Plan   core/proximal LE strengthening; manual therapy including possible DN for abnormal muscle tension; modalities PRN    Consulted and Agree with Plan of Care  Patient       Patient will benefit from skilled therapeutic intervention in order to improve the following deficits and impairments:  Pain, Hypomobility, Impaired flexibility, Increased muscle spasms, Increased fascial restricitons, Decreased range of motion, Decreased strength, Decreased mobility, Difficulty walking, Abnormal gait, Decreased activity tolerance, Decreased balance  Visit Diagnosis: Pain in right leg  Cramp and spasm  Other symptoms and signs involving the musculoskeletal system  Muscle weakness (generalized)  Pain in left foot  Pain in left arm     Problem List Patient Active Problem List   Diagnosis Date Noted  . Hot flashes 05/21/2018  . Chest pain 11/17/2016  . Status post gastric bypass for obesity 03/07/2013  . Varicose veins of lower extremities with other complications 15/87/2761    Percival Spanish, PT, MPT 07/17/2019, 3:05 PM  [facility] [address]  [place], [place], [address] Phone: [phone]   Fax:  [phone]  Name: Kristie Martinez MRN: [id number] Date of Birth: 1961-03-30

## 2019-07-25 ENCOUNTER — Other Ambulatory Visit: Payer: Self-pay

## 2019-07-25 ENCOUNTER — Encounter: Payer: Self-pay | Admitting: Physical Therapy

## 2019-07-25 ENCOUNTER — Ambulatory Visit: Payer: PRIVATE HEALTH INSURANCE | Attending: Physical Medicine and Rehabilitation | Admitting: Physical Therapy

## 2019-07-25 DIAGNOSIS — M79672 Pain in left foot: Secondary | ICD-10-CM | POA: Insufficient documentation

## 2019-07-25 DIAGNOSIS — M79602 Pain in left arm: Secondary | ICD-10-CM | POA: Diagnosis present

## 2019-07-25 DIAGNOSIS — M79604 Pain in right leg: Secondary | ICD-10-CM | POA: Insufficient documentation

## 2019-07-25 DIAGNOSIS — M6281 Muscle weakness (generalized): Secondary | ICD-10-CM | POA: Insufficient documentation

## 2019-07-25 DIAGNOSIS — R29898 Other symptoms and signs involving the musculoskeletal system: Secondary | ICD-10-CM | POA: Diagnosis present

## 2019-07-25 DIAGNOSIS — R252 Cramp and spasm: Secondary | ICD-10-CM | POA: Insufficient documentation

## 2019-07-25 MED FILL — SULINDAC 200 MG TABLET: 200 | 30 days supply | Qty: 60 | Fill #1

## 2019-07-25 NOTE — Therapy (Signed)
[facility] [address]  [place], [place], [address] Phone: [phone]   Fax:  [phone]  Physical Therapy Treatment  Patient Details  Name: Kristie Martinez MRN: [id number] Date of Birth: [birth year] Referring Provider (PT): Neomia Dear, MD   Encounter Date: 07/25/2019  PT End of Session - 07/25/19 0853    Visit Number  12    Number of Visits  13    Authorization Type  WC - 1 Eval + 8 Treat, 4 additional visits approved as of 05/27/19    Authorization - Visit Number  12    Authorization - Number of Visits  13    PT Start Time  4174   Pt arrived late   PT Stop Time  0934    PT Time Calculation (min)  41 min    Activity Tolerance  Patient tolerated treatment well    Behavior During Therapy  [facility] for tasks assessed/performed       Past Medical History:  Diagnosis Date  . Indigestion   . Post-operative nausea and vomiting   . Varicose veins     Past Surgical History:  Procedure Laterality Date  . CERVICAL DISC ARTHROPLASTY    . ENDOMETRIAL ABLATION    . ENDOVENOUS ABLATION SAPHENOUS VEIN W/ LASER  09-27-2012   left greater saphenous vein  by Curt Jews MD  . ENDOVENOUS ABLATION SAPHENOUS VEIN W/ LASER  11-08-2012   right greater saphenous vein   by Curt Jews MD  . mini gastric bypass   2004  . OTHER SURGICAL HISTORY  2004   mini gastric bypass  . TONSILLECTOMY    . TONSILLECTOMY AND ADENOIDECTOMY  1970    There were no vitals filed for this visit.  Subjective Assessment - 07/25/19 0857    Subjective  Pt reporting more of an overall ache at this point. Notes some ongoing soreness in proximal anterior medial thigh (proximal adductors) with increased effort necessary to lift her leg when walking her dog this morning. Stretches and forearm rotation have helped with L forearm pain. Was able to play golf while at the beach for the first time in several years.    Patient Stated Goals  "refamiliarize  myself with exercises and see if I can get the pain better managed"    Currently in Pain?  Yes    Pain Score  2     Pain Location  Leg    Pain Orientation  Right;Anterior;Upper;Medial    Pain Descriptors / Indicators  Aching    Pain Type  Chronic pain    Pain Frequency  Intermittent                       [facility] Adult PT Treatment/Exercise - 07/25/19 0853      Exercises   Exercises  Knee/Hip      Knee/Hip Exercises: Stretches   Other Knee/Hip Stretches  R hip adductor stretch - supine with strap 3 x 30 sec      Knee/Hip Exercises: Standing   Side Lunges  Right;Left;10 reps;5 seconds    Side Lunges Limitations  with & w/o counter support    Other Standing Knee Exercises  B side-stepping & fwd/back monster walk with looped red TB at ankles 4 x 20 ft      Knee/Hip Exercises: Supine   Bridges with Ball Squeeze  Both;15 reps;Strengthening   5" hold with push through heels   Other Supine Knee/Hip  Exercises  Red TB psoas march heels resting on bolster x 10      Knee/Hip Exercises: Sidelying   Clams  R clam x 15 - cues to slightly levitate R foot to relieve pressure/irritation on R medial calcaneal tubercle    Other Sidelying Knee/Hip Exercises  attempted side plank from elbow to knee but limited tolerance due to shoulder weakness and R HS cramping      Manual Therapy   Manual Therapy  Soft tissue mobilization;Myofascial release    Manual therapy comments  supine with R hip slightly flexed & ER    Soft tissue mobilization  STM/DTM to R proximal hip adductors    Myofascial Release  manual TPR to R adductor brevis             PT Education - 07/25/19 0934    Education provided  Yes    Education Details  HEP update    Person(s) Educated  Patient    Methods  Explanation;Demonstration;Handout    Comprehension  Verbalized understanding;Returned demonstration;Need further instruction          PT Long Term Goals - 07/09/19 1836      PT LONG TERM GOAL #1    Title  Independent with ongoing HEP    Status  Partially Met      PT LONG TERM GOAL #2   Title  Patient to improve R HS flexibility to [facility] or symmetrical of L LE without pain limiting    Status  On-going      PT LONG TERM GOAL #3   Title  Patient to improve R LE strength to >/= 4/5 w/o increased pain    Status  On-going      PT LONG TERM GOAL #4   Title  Patient will report ability to walk at speed sufficient to get her HR up for exercise w/o feeling of imbalance or increased risk for falls    Status  On-going            Plan - 07/25/19 0934    Clinical Impression Statement  Kristie Martinez reporting most of her painful areas now relatively mild with more of an overall ache at this point. She still notes some ongoing soreness in R proximal hip adductors while walking with increased effort necessary to lift her leg to clear her foot when walking but was able to play golf while at the beach for the first time in several years. Continued tightness and ttp present in R proximal hip adductors which was addressed with STM/DTM and manual TPR followed by static and dynamic stretching. Initiated HEP review and progression with some limited tolerance for a few exercises due to R HS cramping but overall reporting less pain by end of session. Will plan for final assessment and completion of HEP review next visit.    Rehab Potential  Good    PT Treatment/Interventions  Patient/family education;Therapeutic exercise;Therapeutic activities;Functional mobility training;Gait training;Stair training;Balance training;Manual techniques;Passive range of motion;Dry needling;Taping;Electrical Stimulation;Moist Heat;Ultrasound;Cryotherapy;Vasopneumatic Device;Iontophoresis 61m/ml Dexamethasone;ADLs/Self Care Home Management    PT Next Visit Plan  discharge assessment; completion of HEP review/update    Consulted and Agree with Plan of Care  Patient       Patient will benefit from skilled therapeutic intervention in  order to improve the following deficits and impairments:  Pain, Hypomobility, Impaired flexibility, Increased muscle spasms, Increased fascial restricitons, Decreased range of motion, Decreased strength, Decreased mobility, Difficulty walking, Abnormal gait, Decreased activity tolerance, Decreased balance  Visit Diagnosis: Pain in  right leg  Cramp and spasm  Other symptoms and signs involving the musculoskeletal system  Muscle weakness (generalized)  Pain in left foot  Pain in left arm     Problem List Patient Active Problem List   Diagnosis Date Noted  . Hot flashes 05/21/2018  . Chest pain 11/17/2016  . Status post gastric bypass for obesity 03/07/2013  . Varicose veins of lower extremities with other complications 99/69/2493    Percival Spanish, PT, MPT 07/25/2019, 12:17 PM  [facility] [address]  [place], [place], [address] Phone: [phone]   Fax:  [phone]  Name: Kristie Martinez MRN: [id number] Date of Birth: 27-Nov-1960

## 2019-08-05 ENCOUNTER — Encounter: Payer: Self-pay | Admitting: Physical Therapy

## 2019-08-05 ENCOUNTER — Ambulatory Visit: Payer: PRIVATE HEALTH INSURANCE | Admitting: Physical Therapy

## 2019-08-05 ENCOUNTER — Other Ambulatory Visit: Payer: Self-pay

## 2019-08-05 DIAGNOSIS — M79604 Pain in right leg: Secondary | ICD-10-CM | POA: Diagnosis not present

## 2019-08-05 DIAGNOSIS — R29898 Other symptoms and signs involving the musculoskeletal system: Secondary | ICD-10-CM

## 2019-08-05 DIAGNOSIS — M6281 Muscle weakness (generalized): Secondary | ICD-10-CM

## 2019-08-05 DIAGNOSIS — R252 Cramp and spasm: Secondary | ICD-10-CM

## 2019-08-05 NOTE — Therapy (Addendum)
[facility] [address]  [place], [place], [address] Phone: [phone]   Fax:  [phone]  Physical Therapy Treatment / Discharge Summary  Patient Details  Name: Kristie Martinez MRN: [id number] Date of Birth: 12/21/1960 Referring Provider (PT): Neomia Dear, MD   Encounter Date: 08/05/2019  PT End of Session - 08/05/19 0856    Visit Number  13    Number of Visits  13    Authorization Type  WC - 1 Eval + 8 Treat, 4 additional visits approved as of 05/27/19    Authorization - Visit Number  42    Authorization - Number of Visits  13    PT Start Time  6759   Pt arrived late   PT Stop Time  0938    PT Time Calculation (min)  42 min    Activity Tolerance  Patient tolerated treatment well    Behavior During Therapy  [facility] for tasks assessed/performed       Past Medical History:  Diagnosis Date  . Indigestion   . Post-operative nausea and vomiting   . Varicose veins     Past Surgical History:  Procedure Laterality Date  . CERVICAL DISC ARTHROPLASTY    . ENDOMETRIAL ABLATION    . ENDOVENOUS ABLATION SAPHENOUS VEIN W/ LASER  09-27-2012   left greater saphenous vein  by Curt Jews MD  . ENDOVENOUS ABLATION SAPHENOUS VEIN W/ LASER  11-08-2012   right greater saphenous vein   by Curt Jews MD  . mini gastric bypass   2004  . OTHER SURGICAL HISTORY  2004   mini gastric bypass  . TONSILLECTOMY    . TONSILLECTOMY AND ADENOIDECTOMY  1970    There were no vitals filed for this visit.  Subjective Assessment - 08/05/19 0859    Subjective  Pt reporting she is typically good in the morning, but still has cramps and discomfort in the middle of the night.    Patient Stated Goals  "refamiliarize myself with exercises and see if I can get the pain better managed"    Currently in Pain?  No/denies         [facility] PT Assessment - 08/05/19 0856      Assessment   Medical Diagnosis  R hamstring strain & gluteal  tendinitis    Referring Provider (PT)  Neomia Dear, MD    Onset Date/Surgical Date  05/09/17    Next MD Visit  08/12/19      Observation/Other Assessments   Focus on Therapeutic Outcomes (FOTO)   Upper leg - 61% (39% limitation)      Strength   Right Hip Flexion  4/5    Right Hip Extension  3+/5    Right Hip External Rotation   4/5    Right Hip Internal Rotation  4+/5    Right Hip ABduction  4-/5    Right Hip ADduction  4-/5    Left Hip Flexion  4+/5    Left Hip Extension  3+/5    Left Hip External Rotation  4-/5    Left Hip Internal Rotation  4+/5    Left Hip ABduction  4/5    Left Hip ADduction  4/5    Right Knee Flexion  4/5    Right Knee Extension  4+/5    Left Knee Flexion  4/5    Left Knee Extension  4+/5      Flexibility   Hamstrings  R mild tightness  Quadriceps  B mild/mod tight quad & hip flexors    ITB  R mild tightness    Piriformis  R mod tightness, L mild tightness                   [facility] Adult PT Treatment/Exercise - 08/05/19 0856      Knee/Hip Exercises: Stretches   Quad Stretch  Right;30 seconds;1 rep    Sports administrator Limitations  prone RF stretch with strap    Hip Flexor Stretch  Right;30 seconds;1 rep   each   Hip Flexor Stretch Limitations  mod thomas with strap & 1/2 kneel lunge      Knee/Hip Exercises: Aerobic   Recumbent Bike  L3 x 6 min      Manual Therapy   Manual Therapy  Taping    Kinesiotex  Inhibit Muscle      Kinesiotix   Inhibit Muscle   L wrist extensor group - 25-30% Y strip from radial styloid process with tails surrounding extensor muscle group             PT Education - 08/05/19 0936    Education Details  HEP review/update    Person(s) Educated  Patient    Methods  Explanation;Demonstration;Handout    Comprehension  Verbalized understanding;Returned demonstration;Need further instruction          PT Long Term Goals - 08/05/19 0901      PT LONG TERM GOAL #1   Title  Independent with  ongoing HEP    Status  Achieved      PT LONG TERM GOAL #2   Title  Patient to improve R HS flexibility to [facility] or symmetrical of L LE without pain limiting    Status  Partially Met      PT LONG TERM GOAL #3   Title  Patient to improve R LE strength to >/= 4/5 w/o increased pain    Status  Partially Met      PT LONG TERM GOAL #4   Title  Patient will report ability to walk at speed sufficient to get her HR up for exercise w/o feeling of imbalance or increased risk for falls    Status  Not Met            Plan - 08/05/19 0942    Clinical Impression Statement  Natividad notes benefit from PT but still reports R hamstring pain remains most problematic at night with ongoing cramping disrupting her sleep. Also notes feeling like she trips often with walking, especially when she tries to pick up her pace. She continues to have some ongoing decreased R proximal LE flexibility but now more symmetrical with L with exception of piriformis tighter on R. Overall B LE strength improved with R LE now closer to L with MMT, but ongoing weakness evident most pronounced in B hip extension. HEP reviewed and patient reports good understanding and compliance with HEP for continued stretching and strengthening. Majority of goals met or at least partially met with exception of LTG #4 - Dacy reports she is able to walk 1 mile most evenings with her husband but does not feel she can walk at a fastest enough pace to get her HR up due to feeling like she will trip. She will follow up with Dr. Ace Gins next Monday to determine future course of intervention, therefore will keep chart open for 30 days in the event that further PT is recommended.    Rehab Potential  Good  PT Treatment/Interventions  Patient/family education;Therapeutic exercise;Therapeutic activities;Functional mobility training;Gait training;Stair training;Balance training;Manual techniques;Passive range of motion;Dry needling;Taping;Electrical  Stimulation;Moist Heat;Ultrasound;Cryotherapy;Vasopneumatic Device;Iontophoresis 37m/ml Dexamethasone;ADLs/Self Care Home Management    PT Next Visit Plan  30-day hold    Consulted and Agree with Plan of Care  Patient       Patient will benefit from skilled therapeutic intervention in order to improve the following deficits and impairments:  Pain, Hypomobility, Impaired flexibility, Increased muscle spasms, Increased fascial restricitons, Decreased range of motion, Decreased strength, Decreased mobility, Difficulty walking, Abnormal gait, Decreased activity tolerance, Decreased balance  Visit Diagnosis: Pain in right leg  Cramp and spasm  Other symptoms and signs involving the musculoskeletal system  Muscle weakness (generalized)     Problem List Patient Active Problem List   Diagnosis Date Noted  . Hot flashes 05/21/2018  . Chest pain 11/17/2016  . Status post gastric bypass for obesity 03/07/2013  . Varicose veins of lower extremities with other complications 006/23/7628   JPercival Spanish PT, MPT 08/05/2019, 10:06 AM  [facility] [place] [address]Phone: [phone]  Fax:  [phone] Name: DIYLA BALZARINIMRN: [id number] of Birth: [birth year] PHYSICAL THERAPY DISCHARGE SUMMARY  Visits from Start of Care: 13  Current functional level related to goals / functional outcomes:   Refer to above clinical impression for status as of last visit on 08/05/2019. Patient was placed on hold for 30 days and has not needed to return to PT, therefore will proceed with discharge from PT for this episode.   Remaining deficits:   As above.   Education / Equipment:   HEP  Plan: Patient agrees to discharge.  Patient goals were partially met. Patient is being discharged due to not returning since the last visit.  ?????     JPercival Spanish PT, MPT 09/09/19, 10:07 AM  [facility] [place] [address]Phone: [phone]  Fax:  [phone]

## 2019-11-19 ENCOUNTER — Ambulatory Visit (INDEPENDENT_AMBULATORY_CARE_PROVIDER_SITE_OTHER): Payer: 59 | Admitting: Family Medicine

## 2019-11-19 ENCOUNTER — Encounter: Payer: Self-pay | Admitting: Family Medicine

## 2019-11-19 ENCOUNTER — Other Ambulatory Visit: Payer: Self-pay

## 2019-11-19 DIAGNOSIS — H9203 Otalgia, bilateral: Secondary | ICD-10-CM

## 2019-11-19 MED ORDER — AMOXICILLIN-POT CLAVULANATE 875-125 MG PO TABS: 1.0000 | ORAL_TABLET | Freq: Two times a day (BID) | ORAL | 0 refills | Status: AC

## 2019-11-19 MED ORDER — PREDNISONE 20 MG PO TABS: 40.0000 mg | ORAL_TABLET | Freq: Every day | ORAL | 0 refills | Status: AC

## 2019-11-19 MED ORDER — FLUCONAZOLE 150 MG PO TABS: ORAL_TABLET | ORAL | 0 refills | Status: DC

## 2019-11-19 MED FILL — METHOCARBAMOL 750 MG TABS: 750 | 30 days supply | Qty: 90 | Fill #0

## 2019-11-19 MED FILL — KETOROLAC 10 MG TABLET: 10 | 5 days supply | Qty: 20 | Fill #0

## 2019-11-19 MED FILL — AMOX-CLAV 875-125 MG TABLET: 875-125 | 7 days supply | Qty: 14 | Fill #0

## 2019-11-19 MED FILL — FLUCONAZOLE 150 MG TABLET: 150 | 2 days supply | Qty: 2 | Fill #0

## 2019-11-19 MED FILL — predniSONE 20 MG TABS: 20 | 5 days supply | Qty: 10 | Fill #0

## 2019-11-19 NOTE — Progress Notes (Signed)
Chief Complaint  Patient presents with  . Sore Throat    knot side of neck  . Ear Pain  . Cough  . Nasal Congestion    Caterin E Bolender here for URI complaints. Due to COVID-19 pandemic, we are interacting via web portal for an electronic face-to-face visit. I verified patient's ID using 2 identifiers. Patient agreed to proceed with visit via this method. Patient is at home, I am at office. Patient and I are present for visit.   Duration: 5 days  Associated symptoms: ear pain, runny nose, and swollen LN on R, scratchy throat Denies: sinus congestion, sinus pain, itchy watery eyes, ear drainage, sore throat, wheezing, shortness of breath, myalgia and Gi s/s's, fevers Treatment to date: none Sick contacts: No  ROS:  Const: Denies fevers HEENT: As noted in HPI Lungs: No SOB  Past Medical History:  Diagnosis Date  . Indigestion   . Post-operative nausea and vomiting   . Varicose veins    Exam No conversational dyspnea Age appropriate judgment and insight Nml affect and mood  Otalgia of both ears - Plan: amoxicillin-clavulanate (AUGMENTIN) 875-125 MG tablet, predniSONE (DELTASONE) 20 MG tablet, fluconazole (DIFLUCAN) 150 MG tablet  Suspect ETD, trial pred burst. If no improvement, will trial Augmentin for 7 d, bid.   F/u prn.  Pt voiced understanding and agreement to the plan.  [place], DO 11/19/19 4:28 PM

## 2019-11-29 ENCOUNTER — Other Ambulatory Visit: Payer: Self-pay

## 2019-11-29 ENCOUNTER — Ambulatory Visit (INDEPENDENT_AMBULATORY_CARE_PROVIDER_SITE_OTHER): Payer: 59 | Admitting: Family Medicine

## 2019-11-29 ENCOUNTER — Encounter: Payer: Self-pay | Admitting: Family Medicine

## 2019-11-29 DIAGNOSIS — Z0000 Encounter for general adult medical examination without abnormal findings: Secondary | ICD-10-CM

## 2019-11-29 DIAGNOSIS — Z1231 Encounter for screening mammogram for malignant neoplasm of breast: Secondary | ICD-10-CM

## 2019-11-29 NOTE — Progress Notes (Signed)
Chief Complaint  Patient presents with  . Annual Exam    issues since having the virus and sore throat since vaccine     Well Woman [name] is here for a complete physical.   Due to COVID-19 pandemic, we are interacting via web portal for an electronic face-to-face visit. I verified patient's ID using 2 identifiers. Patient agreed to proceed with visit via this method. Patient is at work, I am at office. Patient and I are present for visit.  Kristie Martinez >1 year ago.  Current diet: in general, a "bad" diet.  Current exercise: some walking; had injury Weight is steadily increasing and she denies daytime fatigue. Seatbelt? Yes  Health Maintenance Pap/HPV- Yes Mammogram- No Colon cancer screening-Yes Shingrix- No Tetanus- Yes Hep C screening- Yes HIV screening- Yes  Past Medical History:  Diagnosis Date  . Indigestion   . Post-operative nausea and vomiting   . Varicose veins      Past Surgical History:  Procedure Laterality Date  . CERVICAL DISC ARTHROPLASTY    . ENDOMETRIAL ABLATION    . ENDOVENOUS ABLATION SAPHENOUS VEIN W/ LASER  09-27-2012   left greater saphenous vein  by Curt Jews MD  . ENDOVENOUS ABLATION SAPHENOUS VEIN W/ LASER  11-08-2012   right greater saphenous vein   by Curt Jews MD  . mini gastric bypass   2004  . OTHER SURGICAL HISTORY  2004   mini gastric bypass  . TONSILLECTOMY    . TONSILLECTOMY AND ADENOIDECTOMY  1970    Medications  Current Outpatient Medications on File Prior to Visit  Medication Sig Dispense Refill  . Magnesium 400 MG TABS Take 1,200 mg by mouth at bedtime.    . methylcellulose oral powder Take 1 packet by mouth daily.     . Multiple Vitamin (MULTIVITAMIN WITH MINERALS) TABS tablet Take 1 tablet by mouth daily.    Marland Kitchen PRESCRIPTION MEDICATION Apply 1-2 Pump topically as needed. Cyclobenzaprine 2%/Diclofenac 3% cream - 3-4x daily PRN    . traZODone (DESYREL) 50 MG tablet Take 0.5-1 tablets (25-50 mg total) by mouth  at bedtime as needed for sleep. 30 tablet 3  . vitamin k 100 MCG tablet Take 50 mcg by mouth daily.    . Biotin 10000 MCG TABS Take by mouth.    . calcium carbonate (TUMS - DOSED IN MG ELEMENTAL CALCIUM) 500 MG chewable tablet Chew 1 tablet by mouth daily.    . cholecalciferol (VITAMIN D) 1000 units tablet Take 5,000 Units by mouth 4 (four) times daily.     . Cyanocobalamin (VITAMIN B-12 PO) Take 5,000 Units by mouth daily.     . diphenhydrAMINE (BENADRYL) 25 mg capsule Take 25 mg by mouth every 6 (six) hours as needed.    . docusate sodium (COLACE) 100 MG capsule Take 100 mg by mouth 2 (two) times daily as needed.     . famotidine (PEPCID) 20 MG tablet Take 20 mg by mouth as needed for heartburn or indigestion.     Allergies No Known Allergies  Review of Systems: Constitutional:  no unexpected weight changes Eye:  no recent significant change in vision Ear/Nose/Mouth/Throat:  Ears:  no recent change in hearing Nose/Mouth/Throat:  no complaints of nasal congestion, no sore throat Cardiovascular: no chest pain Respiratory:  no shortness of breath Gastrointestinal:  no abdominal pain, no change in bowel habits GU:  Female: negative for dysuria or pelvic pain Musculoskeletal/Extremities:  no pain of the joints; +chronic thigh/hamstring pain  Integumentary (Skin/Breast):  no abnormal skin lesions reported Neurologic:  no headaches Endocrine:  denies fatigue Hematologic/Lymphatic:  No areas of easy bleeding  Exam No conversational dyspnea Age appropriate judgment and insight Nml affect and mood  Assessment and Plan  Well adult exam - Plan: CBC, Comp Met (CMET), Lipid Profile  Encounter for screening mammogram for malignant neoplasm of breast - Plan: MM Digital Screening   Well 59 y.o. female. Counseled on diet and exercise. Yoga, cycling and wt resistance exercises rec'd. Take trazodone earlier. Sleep hygiene, CBT info and anxiety coping tech's rec'd.  Other orders as  above. Pickle juice for cramps. Follow up in 1 yr or prn. The patient voiced understanding and agreement to the plan.  [place], DO 11/29/19 8:58 AM

## 2019-12-17 ENCOUNTER — Other Ambulatory Visit (INDEPENDENT_AMBULATORY_CARE_PROVIDER_SITE_OTHER): Payer: 59

## 2019-12-17 ENCOUNTER — Other Ambulatory Visit: Payer: Self-pay | Admitting: Family Medicine

## 2019-12-17 ENCOUNTER — Other Ambulatory Visit: Payer: Self-pay

## 2019-12-17 DIAGNOSIS — Z0000 Encounter for general adult medical examination without abnormal findings: Secondary | ICD-10-CM

## 2019-12-17 DIAGNOSIS — D729 Disorder of white blood cells, unspecified: Secondary | ICD-10-CM

## 2019-12-17 DIAGNOSIS — D72818 Other decreased white blood cell count: Secondary | ICD-10-CM

## 2019-12-17 LAB — LIPID PANEL
Cholesterol: 194 mg/dL (ref 0–200)
HDL: 60.2 mg/dL (ref >39.00)
LDL Cholesterol: 120 mg/dL — ABNORMAL HIGH (ref 0–99)
NonHDL: 133.45
Total CHOL/HDL Ratio: 3
Triglycerides: 67 mg/dL (ref 0.0–149.0)
VLDL: 13.4 mg/dL (ref 0.0–40.0)

## 2019-12-17 LAB — IBC + FERRITIN
Ferritin: 5.1 ng/mL — ABNORMAL LOW (ref 10.0–291.0)
Iron: 35 ug/dL — ABNORMAL LOW (ref 42–145)
Saturation Ratios: 6.9 % — ABNORMAL LOW (ref 20.0–50.0)
Transferrin: 364 mg/dL — ABNORMAL HIGH (ref 212.0–360.0)

## 2019-12-17 LAB — CBC
HCT: 35.3 % — ABNORMAL LOW (ref 36.0–46.0)
Hemoglobin: 11.5 g/dL — ABNORMAL LOW (ref 12.0–15.0)
MCHC: 32.6 g/dL (ref 30.0–36.0)
MCV: 82.2 fl (ref 78.0–100.0)
Platelets: 374 10*3/uL (ref 150.0–400.0)
RBC: 4.3 Mil/uL (ref 3.87–5.11)
RDW: 15.4 % (ref 11.5–15.5)
WBC: 5 10*3/uL (ref 4.0–10.5)

## 2019-12-17 LAB — COMPREHENSIVE METABOLIC PANEL
ALT: 23 U/L (ref 0–35)
AST: 42 U/L — ABNORMAL HIGH (ref 0–37)
Albumin: 4.1 g/dL (ref 3.5–5.2)
Alkaline Phosphatase: 108 U/L (ref 39–117)
BUN: 12 mg/dL (ref 6–23)
CO2: 30 mEq/L (ref 19–32)
Calcium: 9.1 mg/dL (ref 8.4–10.5)
Chloride: 103 mEq/L (ref 96–112)
Creatinine, Ser: 0.65 mg/dL (ref 0.40–1.20)
GFR: 94.01 mL/min (ref >60.00)
Glucose, Bld: 92 mg/dL (ref 70–99)
Potassium: 4.2 mEq/L (ref 3.5–5.1)
Sodium: 139 mEq/L (ref 135–145)
Total Bilirubin: 0.3 mg/dL (ref 0.2–1.2)
Total Protein: 6.6 g/dL (ref 6.0–8.3)

## 2019-12-17 MED FILL — INDOMETHACIN 50 MG CAPSULE: 50 | 30 days supply | Qty: 90 | Fill #0

## 2019-12-17 MED FILL — METAXALONE 800 MG TABS: 800 | 30 days supply | Qty: 90 | Fill #0

## 2019-12-18 ENCOUNTER — Ambulatory Visit (HOSPITAL_BASED_OUTPATIENT_CLINIC_OR_DEPARTMENT_OTHER)
Admission: RE | Admit: 2019-12-18 | Discharge: 2019-12-18 | Disposition: A | Discharge status: Home / Self Care | Payer: 59 | Source: Ambulatory Visit | Attending: Family Medicine | Admitting: Family Medicine

## 2019-12-18 ENCOUNTER — Other Ambulatory Visit (INDEPENDENT_AMBULATORY_CARE_PROVIDER_SITE_OTHER): Payer: 59

## 2019-12-18 DIAGNOSIS — D729 Disorder of white blood cells, unspecified: Secondary | ICD-10-CM | POA: Diagnosis not present

## 2019-12-18 DIAGNOSIS — Z1231 Encounter for screening mammogram for malignant neoplasm of breast: Secondary | ICD-10-CM | POA: Diagnosis not present

## 2019-12-18 DIAGNOSIS — D72818 Other decreased white blood cell count: Secondary | ICD-10-CM

## 2019-12-19 LAB — URINALYSIS, ROUTINE W REFLEX MICROSCOPIC
Bilirubin Urine: NEGATIVE
Hgb urine dipstick: NEGATIVE
Ketones, ur: NEGATIVE
Leukocytes,Ua: NEGATIVE
Nitrite: NEGATIVE
RBC / HPF: NONE SEEN (ref 0–?)
Specific Gravity, Urine: 1.02 (ref 1.000–1.030)
Total Protein, Urine: NEGATIVE
Urine Glucose: NEGATIVE
Urobilinogen, UA: 0.2 (ref 0.0–1.0)
WBC, UA: NONE SEEN (ref 0–?)
pH: 7 (ref 5.0–8.0)

## 2019-12-20 ENCOUNTER — Encounter: Payer: Self-pay | Admitting: Family Medicine

## 2019-12-20 ENCOUNTER — Other Ambulatory Visit (INDEPENDENT_AMBULATORY_CARE_PROVIDER_SITE_OTHER): Payer: 59

## 2019-12-20 DIAGNOSIS — D729 Disorder of white blood cells, unspecified: Secondary | ICD-10-CM | POA: Diagnosis not present

## 2019-12-20 DIAGNOSIS — D72818 Other decreased white blood cell count: Secondary | ICD-10-CM

## 2019-12-20 LAB — FECAL OCCULT BLOOD, IMMUNOCHEMICAL: Fecal Occult Bld: NEGATIVE

## 2019-12-23 ENCOUNTER — Other Ambulatory Visit: Payer: Self-pay | Admitting: Family Medicine

## 2019-12-23 DIAGNOSIS — D729 Disorder of white blood cells, unspecified: Secondary | ICD-10-CM

## 2019-12-23 DIAGNOSIS — D72818 Other decreased white blood cell count: Secondary | ICD-10-CM

## 2020-01-09 ENCOUNTER — Emergency Department (HOSPITAL_COMMUNITY)
Admission: EM | Admit: 2020-01-09 | Discharge: 2020-01-09 | Disposition: A | Discharge status: Home / Self Care | Payer: 59 | Attending: Emergency Medicine | Admitting: Emergency Medicine

## 2020-01-09 ENCOUNTER — Emergency Department (HOSPITAL_COMMUNITY): Payer: 59

## 2020-01-09 ENCOUNTER — Encounter: Payer: Self-pay | Admitting: Family Medicine

## 2020-01-09 ENCOUNTER — Other Ambulatory Visit: Payer: Self-pay

## 2020-01-09 ENCOUNTER — Encounter (HOSPITAL_COMMUNITY): Payer: Self-pay | Admitting: *Deleted

## 2020-01-09 DIAGNOSIS — E876 Hypokalemia: Secondary | ICD-10-CM | POA: Insufficient documentation

## 2020-01-09 DIAGNOSIS — E7800 Pure hypercholesterolemia, unspecified: Secondary | ICD-10-CM | POA: Diagnosis not present

## 2020-01-09 DIAGNOSIS — Z6832 Body mass index (BMI) 32.0-32.9, adult: Secondary | ICD-10-CM | POA: Diagnosis not present

## 2020-01-09 DIAGNOSIS — R0789 Other chest pain: Secondary | ICD-10-CM

## 2020-01-09 DIAGNOSIS — E6609 Other obesity due to excess calories: Secondary | ICD-10-CM | POA: Insufficient documentation

## 2020-01-09 DIAGNOSIS — Z8249 Family history of ischemic heart disease and other diseases of the circulatory system: Secondary | ICD-10-CM | POA: Insufficient documentation

## 2020-01-09 DIAGNOSIS — Z20822 Contact with and (suspected) exposure to covid-19: Secondary | ICD-10-CM | POA: Insufficient documentation

## 2020-01-09 DIAGNOSIS — I10 Essential (primary) hypertension: Secondary | ICD-10-CM | POA: Diagnosis not present

## 2020-01-09 DIAGNOSIS — R200 Anesthesia of skin: Secondary | ICD-10-CM | POA: Diagnosis not present

## 2020-01-09 DIAGNOSIS — R202 Paresthesia of skin: Secondary | ICD-10-CM | POA: Diagnosis not present

## 2020-01-09 DIAGNOSIS — R55 Syncope and collapse: Secondary | ICD-10-CM | POA: Diagnosis not present

## 2020-01-09 LAB — BASIC METABOLIC PANEL
Anion gap: 9 (ref 5–15)
BUN: 15 mg/dL (ref 6–20)
CO2: 25 mmol/L (ref 22–32)
Calcium: 8.6 mg/dL — ABNORMAL LOW (ref 8.9–10.3)
Chloride: 106 mmol/L (ref 98–111)
Creatinine, Ser: 0.91 mg/dL (ref 0.44–1.00)
GFR calc Af Amer: >60 mL/min (ref >60)
GFR calc non Af Amer: >60 mL/min (ref >60)
Glucose, Bld: 144 mg/dL — ABNORMAL HIGH (ref 70–99)
Potassium: 2.9 mmol/L — ABNORMAL LOW (ref 3.5–5.1)
Sodium: 140 mmol/L (ref 135–145)

## 2020-01-09 LAB — CBC WITH DIFFERENTIAL/PLATELET
Abs Immature Granulocytes: 0.03 10*3/uL (ref 0.00–0.07)
Basophils Absolute: 0.1 10*3/uL (ref 0.0–0.1)
Basophils Relative: 1 %
Eosinophils Absolute: 0.3 10*3/uL (ref 0.0–0.5)
Eosinophils Relative: 4 %
HCT: 36.4 % (ref 36.0–46.0)
Hemoglobin: 11.2 g/dL — ABNORMAL LOW (ref 12.0–15.0)
Immature Granulocytes: 0 %
Lymphocytes Relative: 38 %
Lymphs Abs: 2.6 10*3/uL (ref 0.7–4.0)
MCH: 27.3 pg (ref 26.0–34.0)
MCHC: 30.8 g/dL (ref 30.0–36.0)
MCV: 88.6 fL (ref 80.0–100.0)
Monocytes Absolute: 0.5 10*3/uL (ref 0.1–1.0)
Monocytes Relative: 7 %
Neutro Abs: 3.5 10*3/uL (ref 1.7–7.7)
Neutrophils Relative %: 50 %
Platelets: 308 10*3/uL (ref 150–400)
RBC: 4.11 MIL/uL (ref 3.87–5.11)
RDW: 15.9 % — ABNORMAL HIGH (ref 11.5–15.5)
WBC: 6.9 10*3/uL (ref 4.0–10.5)
nRBC: 0 % (ref 0.0–0.2)

## 2020-01-09 LAB — MAGNESIUM: Magnesium: 1.8 mg/dL (ref 1.7–2.4)

## 2020-01-09 LAB — SARS CORONAVIRUS 2 (TAT 6-24 HRS): SARS Coronavirus 2: NEGATIVE

## 2020-01-09 LAB — TROPONIN I (HIGH SENSITIVITY)
Troponin I (High Sensitivity): <2 ng/L (ref <18)
Troponin I (High Sensitivity): 3 ng/L (ref <18)

## 2020-01-09 MED ORDER — ASPIRIN 81 MG PO CHEW
324.0000 mg | CHEWABLE_TABLET | Freq: Once | ORAL | Status: AC
  Administered 2020-01-09: 324 mg via ORAL
  Filled 2020-01-09: qty 4

## 2020-01-09 MED ORDER — SODIUM CHLORIDE 0.9 % IV BOLUS
1000.0000 mL | Freq: Once | INTRAVENOUS | Status: AC
  Administered 2020-01-09: 1000 mL via INTRAVENOUS

## 2020-01-09 MED ORDER — NITROGLYCERIN 0.4 MG SL SUBL: 0.4000 mg | SUBLINGUAL_TABLET | SUBLINGUAL | Status: DC | PRN

## 2020-01-09 MED ORDER — POTASSIUM CHLORIDE 10 MEQ/100ML IV SOLN
10.0000 meq | Freq: Once | INTRAVENOUS | Status: AC
  Administered 2020-01-09: 14:00:00 10 meq via INTRAVENOUS
  Filled 2020-01-09: qty 100

## 2020-01-09 MED ORDER — SODIUM CHLORIDE 0.9 % IV SOLN: Freq: Once | INTRAVENOUS | Status: AC

## 2020-01-09 MED ORDER — POTASSIUM CHLORIDE CRYS ER 20 MEQ PO TBCR
60.0000 meq | EXTENDED_RELEASE_TABLET | Freq: Once | ORAL | Status: AC
  Administered 2020-01-09: 14:00:00 60 meq via ORAL
  Filled 2020-01-09: qty 3

## 2020-01-09 NOTE — ED Provider Notes (Signed)
[facility] Provider Note   CSN: [id number] Arrival date & time: 01/09/20  1205     History No chief complaint on file.   Kristie Martinez is a 59 y.o. female.  HPI   59 year old female with left upper extremity tingling and heaviness.  She had an episode yesterday in the late morning while working at her computer.  Describes numbness in her left forearm down into her hand.  This waxed and waned over about a period of an hour and then resolved.  She had no other associated symptoms with it.  Today she was again at the computer working when she had the same sensation in her left arm but it was somewhat more intense.  She was standing at her desk when symptoms began.  Her arm/elbow were not pressing against a hard surface.  This time she also had associated nausea and became diaphoretic.  No dyspnea.  No chest pain.  No known cardiac history that she is aware.  She does have a significant family history though.  One of her brothers had CABG at the age of 50.  She reports that her father died from what they think were cardiac issues when he was in the 71s.  She has been previously seen by Dr. Tamala Julian, cardiology.  In January 2018 she had a low risk stress test.  Past Medical History:  Diagnosis Date  . Indigestion   . Post-operative nausea and vomiting   . Varicose veins     Patient Active Problem List   Diagnosis Date Noted  . Hot flashes 05/21/2018  . Chest pain 11/17/2016  . Status post gastric bypass for obesity 03/07/2013  . Varicose veins of lower extremities with other complications 78/29/5621    Past Surgical History:  Procedure Laterality Date  . CERVICAL DISC ARTHROPLASTY    . ENDOMETRIAL ABLATION    . ENDOVENOUS ABLATION SAPHENOUS VEIN W/ LASER  09-27-2012   left greater saphenous vein  by Curt Jews MD  . ENDOVENOUS ABLATION SAPHENOUS VEIN W/ LASER  11-08-2012   right greater saphenous vein   by Curt Jews MD  . mini gastric bypass    2004  . OTHER SURGICAL HISTORY  2004   mini gastric bypass  . TONSILLECTOMY    . TONSILLECTOMY AND ADENOIDECTOMY  1970    OB History   No obstetric history on file.    Family History  Problem Relation Age of Onset  . CAD Mother   . Heart attack Mother        died @ 38 of MI  . Hypertension Mother   . Hyperlipidemia Mother   . Heart attack Father        died @ 14 of MI  . Hypertension Father   . CAD Brother        s/p CABG x 3 @ age 52  . Hypertension Brother   . Hyperlipidemia Brother   . Heart attack Brother   . Diabetes Maternal Grandmother   . Diabetes Maternal Grandfather   . COPD Paternal Grandmother   . Hypertension Brother   . Colon cancer Neg Hx     Social History   Tobacco Use  . Smoking status: Never Smoker  . Smokeless tobacco: Never Used  Substance Use Topics  . Alcohol use: Yes    Comment: occasional glass of wine.  . Drug use: No    Home Medications Prior to Admission medications   Medication Sig Start Date End  Date Taking? Authorizing Provider  Biotin 87681 MCG TABS Take by mouth.    [provider]  calcium carbonate (TUMS - DOSED IN MG ELEMENTAL CALCIUM) 500 MG chewable tablet Chew 1 tablet by mouth daily.    [provider]  cholecalciferol (VITAMIN D) 1000 units tablet Take 5,000 Units by mouth 4 (four) times daily.     [provider]  Cyanocobalamin (VITAMIN B-12 PO) Take 5,000 Units by mouth daily.     [provider]  diphenhydrAMINE (BENADRYL) 25 mg capsule Take 25 mg by mouth every 6 (six) hours as needed.    [provider]  docusate sodium (COLACE) 100 MG capsule Take 100 mg by mouth 2 (two) times daily as needed.     [provider]  famotidine (PEPCID) 20 MG tablet Take 20 mg by mouth as needed for heartburn or indigestion.    [provider]  Magnesium 400 MG TABS Take 1,200 mg by mouth at bedtime.    [provider]  methylcellulose oral powder Take 1 packet  by mouth daily.     [provider]  Multiple Vitamin (MULTIVITAMIN WITH MINERALS) TABS tablet Take 1 tablet by mouth daily.    [provider]  PRESCRIPTION MEDICATION Apply 1-2 Pump topically as needed. Cyclobenzaprine 2%/Diclofenac 3% cream - 3-4x daily PRN    [provider]  traZODone (DESYREL) 50 MG tablet Take 0.5-1 tablets (25-50 mg total) by mouth at bedtime as needed for sleep. 05/21/18   Sharlene Dory, DO  vitamin k 100 MCG tablet Take 50 mcg by mouth daily.    [provider]    Allergies    Patient has no known allergies.  Review of Systems   Review of Systems All systems reviewed and negative, other than as noted in HPI.  Physical Exam Updated Vital Signs There were no vitals taken for this visit.  Physical Exam Vitals and nursing note reviewed.  Constitutional:      General: She is not in acute distress.    Appearance: She is well-developed.  HENT:     Head: Normocephalic and atraumatic.  Eyes:     General:        Right eye: No discharge.        Left eye: No discharge.     Conjunctiva/sclera: Conjunctivae normal.  Cardiovascular:     Rate and Rhythm: Normal rate and regular rhythm.     Heart sounds: Normal heart sounds. No murmur. No friction rub. No gallop.   Pulmonary:     Effort: Pulmonary effort is normal. No respiratory distress.     Breath sounds: Normal breath sounds.  Abdominal:     General: There is no distension.     Palpations: Abdomen is soft.     Tenderness: There is no abdominal tenderness.  Musculoskeletal:        General: No tenderness.     Cervical back: Neck supple.  Skin:    General: Skin is warm and dry.  Neurological:     General: No focal deficit present.     Mental Status: She is alert and oriented to person, place, and time.     Cranial Nerves: No cranial nerve deficit.     Motor: No weakness.     Coordination: Coordination normal.  Psychiatric:        Behavior: Behavior normal.          Thought Content: Thought content normal.     ED Results / Procedures /  Treatments   Labs (all labs ordered are listed, but only abnormal results are displayed) Labs Reviewed  CBC WITH DIFFERENTIAL/PLATELET - Abnormal; Notable for the following components:      Result Value   Hemoglobin 11.2 (*)    RDW 15.9 (*)    All other components within normal limits  BASIC METABOLIC PANEL - Abnormal; Notable for the following components:   Potassium 2.9 (*)    Glucose, Bld 144 (*)    Calcium 8.6 (*)    All other components within normal limits  SARS CORONAVIRUS 2 (TAT 6-24 HRS)  MAGNESIUM  TROPONIN I (HIGH SENSITIVITY)  TROPONIN I (HIGH SENSITIVITY)    EKG EKG Interpretation  Date/Time:  Thursday January 09 2020 12:06:38 EST Ventricular Rate:  87 PR Interval:    QRS Duration: 89 QT Interval:  385 QTC Calculation: 464 R Axis:   -12 Text Interpretation: Sinus rhythm Low voltage, precordial leads Confirmed by Raeford Razor [phone]) on 01/09/2020 12:13:04 PM   Radiology No results found.  Procedures Procedures (including critical care time)  Medications Ordered in ED Medications  aspirin chewable tablet 324 mg (has no administration in time range)    ED Course  I have reviewed the triage vital signs and the nursing notes.  Pertinent labs & imaging results that were available during my care of the patient were reviewed by me and considered in my medical decision making (see chart for details).  Clinical Course as of Jan 08 1538  Thu Jan 09, 2020  1519 Pt signed out to me by Dr Juleen China.  Briefly 59 yo female presenting with episode of left forearm heaviness, happened again with nausea here in ED.  Trop 2, ecg without acute changes.  Pending cards consult.     [MT]    Clinical Course User Index [MT] Trifan, Kermit Balo, MD   MDM Rules/Calculators/A&P                      59 year old female with numbness/tingling/heaviness in her left upper extremity yesterday and again  today.  I am more concerned for possible cardiac etiology  particularly with her family history and associated dyspnea/diaphoresisnd less likely an acute neurological event.  Her EKG is not overtly changed from priors.  Will obtain chest x-ray and labs including troponins.  Aspirin.  Anticipate cardiology consultation.  Final Clinical Impression(s) / ED Diagnoses Final diagnoses:  Paresthesia of left arm    Rx / DC Orders ED Discharge Orders    None       Raeford Razor, MD 01/14/20 [phone]

## 2020-01-09 NOTE — Telephone Encounter (Signed)
Disregard patient went to the ED

## 2020-01-09 NOTE — Telephone Encounter (Signed)
Spoke with patient she stated the numbness/tingling to her left arm it started only when using her computer only, she is a Engineer, civil (consulting), denies SOB, chest pain, she says it starts around her elbow area and then goes down to her thumb

## 2020-01-09 NOTE — ED Triage Notes (Signed)
Patient presents to ed , states she was working upstairs and his am while at her computer her left arm started getting numb and hurting , states it lasted for a short time and then resolved. States she felt like she was going to faint. C/o sharp stabbing pain left anterior chest.

## 2020-01-09 NOTE — Consult Note (Signed)
Cardiology Admission History and Physical:   Patient ID: Kristie Martinez MRN: [id number]; DOB: 1961/06/09   Admission date: 01/09/2020  Primary Care Provider: Sharlene Dory, DO Primary Cardiologist: Lesleigh Noe, MD  Primary Electrophysiologist:  None   Chief Complaint:  Chest pain  Patient Profile:   Kristie Martinez is a 59 y.o. female with history of normal Myoview Exercise stress test in 2018, family history positive for MI in father at 34 and brother requiring CABG at 94, indigestion, varicose veins, cervical radiculopathy, and carpal tunnel during pregnancy who is being seen for chest pain.   History of Present Illness:   Kristie Martinez was first seen in 2017 in consult for chest pain. EKG was unremarkable and troponin was normal. It was recommended she be discharged and seen in the outpatient setting. She was set up for an OP Myoview stress test which she underwent on 11/2016 which was  overall normal study with no evidence for ischemia or infarction with normal EF. She was seen in follow-up and felt chest pain might have been due to cervical neck disease. She was started on Protonix 40 mg daily for indigestion.  The patient presented to the ED 01/09/20 for chest pain. 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 Yesterday she was at home and working on her computer when she felt left arm numbness and tingling. She had similar symptoms on the right with her h/o of cervical radiculopathy and this felt similar but on the left side. The pain lasted only for a couple minutes and stopped after she stopped working on her computer. The next day she was at work had finished morning rounds. She had 2 cups of coffee and a muffin. She went into her office and felt the same arm numbness and tingling in her left arm but worse. She felt short of breath and felt lightheaded and dizzy and nauseous. She felt the back of her head become diaphoretic as well. She felt she could not get up from her chair. Rapid  response was on the floor so they took the patient down to the ER for further evaluation. Once in the ER she felt a twinge of chest pain on the left side, 2/10. It was sharp and sudden and then resolved. She did not feel sob with this. She has not had chest pain since that episode. She said she felt the same sensation on the right side when she had right sided cervical radiculopathy.   In the ED BP 71/59, pulse 84, 98%, RR 23, O2, afebrile. Labs showed potassium 2.9, glucose 144, creatinine 0.91, calcium 8.6. WBC 6.9, Hgb 11.2. Hs troponin less than 2. CXR unremarkable. CT head negative. EKG is non acute. Patient was given potassium and IVF bolus and started feeling better. Also received aspirin 324 mg in the ED. Cardiology was consulted.   Patient denies tobbaco, alcohol, or drug use. She works multiple jobs but does not feel more stressed than normal. She has gained about 25 lbs in the last year. She pulled one of the muscles in her right leg and this has limited her activity. She used to go dancing twice weekly but has not been able to since the pandemic and her injury. She takes an antiinflammatory and muscle relaxer for this.   Heart Pathway Score:     Past Medical History:  Diagnosis Date  . Indigestion   . Post-operative nausea and vomiting   . Varicose veins     Past Surgical  History:  Procedure Laterality Date  . CERVICAL DISC ARTHROPLASTY    . ENDOMETRIAL ABLATION    . ENDOVENOUS ABLATION SAPHENOUS VEIN W/ LASER  09-27-2012   left greater saphenous vein  by Gretta Began MD  . ENDOVENOUS ABLATION SAPHENOUS VEIN W/ LASER  11-08-2012   right greater saphenous vein   by Gretta Began MD  . mini gastric bypass   2004  . OTHER SURGICAL HISTORY  2004   mini gastric bypass  . TONSILLECTOMY    . TONSILLECTOMY AND ADENOIDECTOMY  1970     Medications Prior to Admission: Prior to Admission medications   Medication Sig Start Date End Date Taking? Authorizing Provider  Biotin 95093 MCG TABS  Take by mouth.    [provider]  calcium carbonate (TUMS - DOSED IN MG ELEMENTAL CALCIUM) 500 MG chewable tablet Chew 1 tablet by mouth daily.    [provider]  cholecalciferol (VITAMIN D) 1000 units tablet Take 5,000 Units by mouth 4 (four) times daily.     [provider]  Cyanocobalamin (VITAMIN B-12 PO) Take 5,000 Units by mouth daily.     [provider]  diphenhydrAMINE (BENADRYL) 25 mg capsule Take 25 mg by mouth every 6 (six) hours as needed.    [provider]  docusate sodium (COLACE) 100 MG capsule Take 100 mg by mouth 2 (two) times daily as needed.     [provider]  famotidine (PEPCID) 20 MG tablet Take 20 mg by mouth as needed for heartburn or indigestion.    [provider]  Magnesium 400 MG TABS Take 1,200 mg by mouth at bedtime.    [provider]  methylcellulose oral powder Take 1 packet by mouth daily.     [provider]  Multiple Vitamin (MULTIVITAMIN WITH MINERALS) TABS tablet Take 1 tablet by mouth daily.    [provider]  PRESCRIPTION MEDICATION Apply 1-2 Pump topically as needed. Cyclobenzaprine 2%/Diclofenac 3% cream - 3-4x daily PRN    [provider]  traZODone (DESYREL) 50 MG tablet Take 0.5-1 tablets (25-50 mg total) by mouth at bedtime as needed for sleep. 05/21/18   Sharlene Dory, DO  vitamin k 100 MCG tablet Take 50 mcg by mouth daily.    [provider]     Allergies:   No Known Allergies  Social History:   Social History   Socioeconomic History  . Marital status: Married    Spouse name: Not on file  . Number of children: Not on file  . Years of education: Not on file  . Highest education level: Not on file  Occupational History  . Occupation: Production designer, theatre/television/film for [employer]  Tobacco Use  . Smoking status: Never Smoker  . Smokeless tobacco: Never Used  Substance and Sexual Activity  . Alcohol use: Yes    Comment:  occasional glass of wine.  . Drug use: No  . Sexual activity: Not on file  Other Topics Concern  . Not on file  Social History Narrative   Lives in [place] with husband.  Works @ [employer].  Finished PhD in 2016.  Exercises regularly.   Social Determinants of Health   Financial Resource Strain:   . Difficulty of Paying Living Expenses: Not on file  Food Insecurity:   . Worried About Programme researcher, broadcasting/film/video in the Last Year: Not on file  . Ran Out of Food in the Last Year: Not on file  Transportation Needs:   .  Lack of Transportation (Medical): Not on file  . Lack of Transportation (Non-Medical): Not on file  Physical Activity:   . Days of Exercise per Week: Not on file  . Minutes of Exercise per Session: Not on file  Stress:   . Feeling of Stress : Not on file  Social Connections:   . Frequency of Communication with Friends and Family: Not on file  . Frequency of Social Gatherings with Friends and Family: Not on file  . Attends Religious Services: Not on file  . Active Member of Clubs or Organizations: Not on file  . Attends Banker Meetings: Not on file  . Marital Status: Not on file  Intimate Partner Violence:   . Fear of Current or Ex-Partner: Not on file  . Emotionally Abused: Not on file  . Physically Abused: Not on file  . Sexually Abused: Not on file    Family History:   The patient's family history includes CAD in her brother and mother; COPD in her paternal grandmother; Diabetes in her maternal grandfather and maternal grandmother; Heart attack in her brother, father, and mother; Hyperlipidemia in her brother and mother; Hypertension in her brother, brother, father, and mother. There is no history of Colon cancer.    ROS:  Please see the history of present illness.  All other ROS reviewed and negative.     Physical Exam/Data:   Vitals:   01/09/20 1345 01/09/20 1400 01/09/20 1415 01/09/20 1445  BP: 110/62 98/69 (!) 102/51 107/68  Pulse: 81 83 88 80  Resp:  (!) 21 19 (!) 24 (!) 39  Temp:      TempSrc:      SpO2: 100% 100% 100% 100%  Weight:      Height:       No intake or output data in the 24 hours ending 01/09/20 1555 Last 3 Weights 01/09/2020 02/18/2019 07/24/2018  Weight (lbs) 200 lb 208 lb 195 lb  Weight (kg) 90.719 kg 94.348 kg 88.451 kg     Body mass index is 32.28 kg/m.  General:  Well nourished, well developed, in no acute distress HEENT: normal Lymph: no adenopathy Neck: no JVD Endocrine:  No thryomegaly Vascular: No carotid bruits; FA pulses 2+ bilaterally without bruits  Cardiac:  normal S1, S2; RRR; no murmur  Lungs:  clear to auscultation bilaterally, no wheezing, rhonchi or rales  Abd: soft, nontender, no hepatomegaly  Ext: minimal edema Musculoskeletal:  No deformities, BUE and BLE strength normal and equal Skin: warm and dry  Neuro:  CNs 2-12 intact, no focal abnormalities noted Psych:  Normal affect    EKG:  The ECG that was done 01/09/20 was personally reviewed and demonstrates NSR, 87 bpm, possible 1st degree AV block, nonspecific T wave changes II and III  Relevant CV Studies:  Myoview exercise stress test 11/2016  Nuclear stress EF: 63%.  There was no ST segment deviation noted during stress.  Blood pressure demonstrated a normal response to exercise.  The study is normal.  The left ventricular ejection fraction is normal (55-65%).   Normal study, no evidence for ischemia or infarction.   Laboratory Data:  High Sensitivity Troponin:   Recent Labs  Lab 01/09/20 1230  TROPONINIHS <2      Chemistry Recent Labs  Lab 01/09/20 1230  NA 140  K 2.9*  CL 106  CO2 25  GLUCOSE 144*  BUN 15  CREATININE 0.91  CALCIUM 8.6*  GFRNONAA >60  GFRAA >60  ANIONGAP 9  No results for input(s): PROT, ALBUMIN, AST, ALT, ALKPHOS, BILITOT in the last 168 hours. Hematology Recent Labs  Lab 01/09/20 1230  WBC 6.9  RBC 4.11  HGB 11.2*  HCT 36.4  MCV 88.6  MCH 27.3  MCHC 30.8  RDW 15.9*  PLT 308    BNPNo results for input(s): BNP, PROBNP in the last 168 hours.  DDimer No results for input(s): DDIMER in the last 168 hours.   Radiology/Studies:  DG Chest 2 View  Result Date: 01/09/2020 CLINICAL DATA:  Left upper extremity numbness and syncope EXAM: CHEST - 2 VIEW COMPARISON:  November 17, 2016 FINDINGS: Lungs are clear. Heart size and pulmonary vascularity are normal. No adenopathy. Postoperative changes noted in the lower cervical spine. IMPRESSION: Lungs clear.  Cardiac silhouette within normal limits. Electronically Signed   By: Lowella Grip III M.D.   On: 01/09/2020 12:32   CT Head Wo Contrast  Result Date: 01/09/2020 CLINICAL DATA:  Focal neuro deficit. Transient left arm pain and numbness. Presyncope. Chest pain. EXAM: CT HEAD WITHOUT CONTRAST TECHNIQUE: Contiguous axial images were obtained from the base of the skull through the vertex without intravenous contrast. COMPARISON:  None. FINDINGS: Brain: There is no evidence of acute infarct, intracranial hemorrhage, mass, midline shift, or extra-axial fluid collection. The ventricles and sulci are normal. Vascular: No hyperdense vessel. Skull: No fracture or suspicious osseous lesion. Sinuses/Orbits: Paranasal sinuses and mastoid air cells are clear. Unremarkable orbits. Other: None. IMPRESSION: Negative head CT. Electronically Signed   By: Logan Bores M.D.   On: 01/09/2020 13:04    HEAR Score (for undifferentiated chest pain):  HEAR Score: 2    Assessment and Plan:   Atypical chest pain Patient presented to the ED with 2 episodes of right arm numbness possibly related to cervical disease. She had no chest pain with these episodes. She had a twinge of chest pain, 2/10, once in the ER while sitting. HS troponin less than 2. EKG non acute.  - Patient has no significant cardiac h/o. No history of exertional CP or SOB - Myoview in 2017 was low risk with no ischemia - Risk factors include positive family history of CAD - Continue  to trend troponin - EKG non acute - Do not not suspect cardiac etiology. If the next troponin is normal would discharge the patient and follow-up as outpatient. Consider cardiac CT.   Arm numbness/H/o of cervical radiculopathy - CT head negative - h/o of right sided neuropathy. Sensation on the left side is similar.  - consider imaging of the neck  Hypokalemia - potasium 2.9 on admission - Given IV and oral K and patient is feeling better  Hypotension - improving with IVF   For questions or updates, please contact [facility] Please consult [email or web address] for contact info under        Signed, Kenya Shiraishi Ninfa Meeker, PA-C  01/09/2020 3:55 PM

## 2020-01-09 NOTE — Telephone Encounter (Signed)
Do you want to squeeze her in on tomorrow  Please advise

## 2020-01-09 NOTE — Discharge Instructions (Signed)
Your potassium today was 2.9.  We gave you some potassium here.  Read over the list of attached foods to help supplement potassium into your diet.  Your cardiac workup today was benign.  However, the heart doctors recommended that you follow up with them as an outpatient.  They may want to perform further testing to evaluate your coronary arteries.  Please call to schedule an appointment with them in the next week if possible.

## 2020-01-10 NOTE — ED Provider Notes (Signed)
Clinical Course as of Jan 09 1147  Thu Jan 09, 2020  1519 Pt signed out to me by Dr Juleen China.  Briefly 60 yo female presenting with episode of left forearm heaviness, happened again with nausea here in ED.  Trop 2, ecg without acute changes.  Pending cards consult.     [MT]    Clinical Course User Index [MT] Eleena Grater, Kermit Balo, MD      Terald Sleeper, MD 01/10/20 [phone]

## 2020-01-13 ENCOUNTER — Other Ambulatory Visit: Payer: Self-pay | Admitting: Medical

## 2020-01-13 DIAGNOSIS — R079 Chest pain, unspecified: Secondary | ICD-10-CM

## 2020-01-13 NOTE — Progress Notes (Signed)
CARDIOLOGY OFFICE NOTE  Date:  01/15/2020    Kristie Martinez Date of Birth: 08-27-1961 Medical Record #[id number]  PCP:  Sharlene Dory, DO  Cardiologist:  Katrinka Blazing  Chief Complaint  Patient presents with  . Follow-up    Post ER visit.     History of Present Illness: Kristie Martinez is a 59 y.o. female who presents today for a post ER visit. Seen for Dr. Katrinka Blazing.   She has a history of obesity and HLD - last seen here by Bary Castilla, PA in 2018.   Presented recently to the ER with atypical chest pain and left arm discomfort/numbness. Potassium was 2.9 and BP was 70's systolic. Last Myoview in 2018 that was negative. Symptoms seemed more radicular. She was hydrated and potassium replaced. Plan was for cardiac CT along with recommendations for CV risk factor modification - this has not been scheduled.   The patient does not have symptoms concerning for COVID-19 infection (fever, chills, cough, or new shortness of breath).   Comes in today. Here alone. Her arm is still bothering her - tingling at times. She works at [employer]. Lots of stress but seems to be used to that - "no more than usual". She is trying to figure out the etiology of what happened last week. That was her first day off in about 10 weeks. Noted left arm tingling when she sat down to use the computer. She has had prior injury to her hip - S1 injury - limits her activity - gaining weight and on multiple supplements for the inflammation - does not feel like she is eating more but continues to gain. Also going thru menopause. She is taking Magnesium/vit K and D. She is also iron deficient - on iron therapy. Still dizzy and fainty feeling. Arm still numb/with 3 fingers every time she sits down. She feels like something is wrong. She has not followed up yet with PCP. Bp remains low.   Past Medical History:  Diagnosis Date  . Indigestion   . Post-operative nausea and vomiting   . Varicose veins     Past Surgical History:   Procedure Laterality Date  . CERVICAL DISC ARTHROPLASTY    . ENDOMETRIAL ABLATION    . ENDOVENOUS ABLATION SAPHENOUS VEIN W/ LASER  09-27-2012   left greater saphenous vein  by Gretta Began MD  . ENDOVENOUS ABLATION SAPHENOUS VEIN W/ LASER  11-08-2012   right greater saphenous vein   by Gretta Began MD  . mini gastric bypass   2004  . OTHER SURGICAL HISTORY  2004   mini gastric bypass  . TONSILLECTOMY    . TONSILLECTOMY AND ADENOIDECTOMY  1970     Medications: Current Meds  Medication Sig  . Biotin 26378 MCG TABS Take by mouth.  . calcium carbonate (TUMS - DOSED IN MG ELEMENTAL CALCIUM) 500 MG chewable tablet Chew 1 tablet by mouth daily.  . cholecalciferol (VITAMIN D) 1000 units tablet Take 5,000 Units by mouth 4 (four) times daily.   . Cyanocobalamin (VITAMIN B-12 PO) Take 5,000 Units by mouth daily.   . diphenhydrAMINE (BENADRYL) 25 mg capsule Take 25 mg by mouth every 6 (six) hours as needed.  . docusate sodium (COLACE) 100 MG capsule Take 100 mg by mouth 2 (two) times daily as needed.   . famotidine (PEPCID) 20 MG tablet Take 20 mg by mouth as needed for heartburn or indigestion.  . indomethacin (INDOCIN) 25 MG capsule Take 25 mg by mouth  2 (two) times daily with a meal.  . Magnesium 400 MG TABS Take 1,200 mg by mouth at bedtime.  . metaxalone (SKELAXIN) 800 MG tablet Take 800 mg by mouth 2 (two) times daily.  . methylcellulose oral powder Take 1 packet by mouth daily.   . Multiple Vitamin (MULTIVITAMIN WITH MINERALS) TABS tablet Take 1 tablet by mouth daily.  Marland Kitchen PRESCRIPTION MEDICATION Apply 1-2 Pump topically as needed. Cyclobenzaprine 2%/Diclofenac 3% cream - 3-4x daily PRN  . traZODone (DESYREL) 50 MG tablet Take 0.5-1 tablets (25-50 mg total) by mouth at bedtime as needed for sleep.  . vitamin k 100 MCG tablet Take 50 mcg by mouth daily.   Current Facility-Administered Medications for the 01/15/20 encounter (Office Visit) with Burtis Junes, NP  Medication  . 0.9 %   sodium chloride infusion     Allergies: No Known Allergies  Social History: The patient  reports that she has never smoked. She has never used smokeless tobacco. She reports current alcohol use. She reports that she does not use drugs.   Family History: The patient's family history includes CAD in her brother and mother; COPD in her paternal grandmother; Diabetes in her maternal grandfather and maternal grandmother; Heart attack in her brother, father, and mother; Hyperlipidemia in her brother and mother; Hypertension in her brother, brother, father, and mother.   Review of Systems: Please see the history of present illness.   All other systems are reviewed and negative.   Physical Exam: VS:  Pulse 76   Ht 5\' 6"  (1.676 m)   Wt 213 lb 12.8 oz (97 kg)   SpO2 98%   BMI 34.51 kg/m  .  BMI Body mass index is 34.51 kg/m.  Wt Readings from Last 3 Encounters:  01/15/20 213 lb 12.8 oz (97 kg)  01/09/20 200 lb (90.7 kg)  02/18/19 208 lb (94.3 kg)   BP was 80/60 in the right arm and 100/80 in the left.   General: Alert and in no acute distress.She is obese.   Cardiac: Regular rate and rhythm. Occasional ectopic. No murmurs, rubs, or gallops. No edema.  Respiratory:  Lungs are clear to auscultation bilaterally with normal work of breathing.  GI: Soft and nontender.  MS: No deformity or atrophy. Gait and ROM intact.  Skin: Warm and dry. Color is normal.  Neuro:  Strength and sensation are intact and no gross focal deficits noted.  Psych: Alert, appropriate and with normal affect.   LABORATORY DATA:  EKG:  EKG is not ordered today.   Lab Results  Component Value Date   WBC 6.9 01/09/2020   HGB 11.2 (L) 01/09/2020   HCT 36.4 01/09/2020   PLT 308 01/09/2020   GLUCOSE 144 (H) 01/09/2020   CHOL 194 12/17/2019   TRIG 67.0 12/17/2019   HDL 60.20 12/17/2019   LDLCALC 120 (H) 12/17/2019   ALT 23 12/17/2019   AST 42 (H) 12/17/2019   NA 140 01/09/2020   K 2.9 (L) 01/09/2020   CL  106 01/09/2020   CREATININE 0.91 01/09/2020   BUN 15 01/09/2020   CO2 25 01/09/2020   TSH 2.92 05/21/2018     BNP (last 3 results) No results for input(s): BNP in the last 8760 hours.  ProBNP (last 3 results) No results for input(s): PROBNP in the last 8760 hours.   Other Studies Reviewed Today:  Myoview exercise stress test 11/2016  Nuclear stress EF: 63%.  There was no ST segment deviation noted during stress.  Blood  pressure demonstrated a normal response to exercise.  The study is normal.  The left ventricular ejection fraction is normal (55-65%).  Normal study, no evidence for ischemia or infarction.    Assessment/Plan:  1. Atypical chest pain - left arm pain/numbness/tingling - plan was for her to have cardiac CT per cardiology from her ER visit - this will be arranged. Lab today as well.   2. Marked hypotension in the setting of hypokalemia - no clear cut reason as to why - rechecking lab today - needs to see PCP - I question if she has something related to aldosterone/cortisol???  3. HLD - will see what her cardiac CT shows and then decide about management.   4. Obesity - limited ability to exercise due to prior injury.   5. Iron deficiency anemia - unclear etiology - she has had prior colonoscopy. She is on iron.   6. COVID-19 Education: The signs and symptoms of COVID-19 were discussed with the patient and how to seek care for testing (follow up with PCP or arrange E-visit).  The importance of social distancing, staying at home, hand hygiene and wearing a mask when out in public were discussed today.  Current medicines are reviewed with the patient today.  The patient does not have concerns regarding medicines other than what has been noted above.  The following changes have been made:  See above.  Labs/ tests ordered today include:    Orders Placed This Encounter  Procedures  . CT CORONARY MORPH W/CTA COR W/SCORE W/CA W/CM &/OR WO/CM  . CT CORONARY  FRACTIONAL FLOW RESERVE DATA PREP  . CT CORONARY FRACTIONAL FLOW RESERVE FLUID ANALYSIS  . Basic metabolic panel  . Magnesium  . Hepatic function panel  . TSH     Disposition:   Further disposition pending.    Patient is agreeable to this plan and will call if any problems develop in the interim.   SignedNorma Fredrickson, NP  01/15/2020 9:56 AM  [facility] [address] [place], [place]  [address] Phone: [phone] Fax: [phone]

## 2020-01-15 ENCOUNTER — Encounter: Payer: Self-pay | Admitting: Nurse Practitioner

## 2020-01-15 ENCOUNTER — Ambulatory Visit: Payer: 59 | Admitting: Nurse Practitioner

## 2020-01-15 ENCOUNTER — Other Ambulatory Visit: Payer: Self-pay

## 2020-01-15 VITALS — HR 76 | Ht 66.0 in | Wt 213.8 lb

## 2020-01-15 DIAGNOSIS — I259 Chronic ischemic heart disease, unspecified: Secondary | ICD-10-CM

## 2020-01-15 DIAGNOSIS — R202 Paresthesia of skin: Secondary | ICD-10-CM

## 2020-01-15 DIAGNOSIS — E876 Hypokalemia: Secondary | ICD-10-CM | POA: Diagnosis not present

## 2020-01-15 NOTE — Patient Instructions (Addendum)
After Visit Summary:  We will be checking the following labs today - BMET and Mag level   Medication Instructions:    Continue with your current medicines.    If you need a refill on your cardiac medications before your next appointment, please call your pharmacy.     Testing/Procedures To Be Arranged:  N/A  Follow-Up:   Let's see what your cardiac CT shows and will then decide about follow up.     At [facility], you and your health needs are our priority.  As part of our continuing mission to provide you with exceptional heart care, we have created designated Provider Care Teams.  These Care Teams include your primary Cardiologist (physician) and Advanced Practice Providers (APPs -  Physician Assistants and Nurse Practitioners) who all work together to provide you with the care you need, when you need it.  Special Instructions:  . Stay safe, stay home, wash your hands for at least 20 seconds and wear a mask when out in public.  . It was good to talk with you today.  . Call your PCP about possible further work up for low potassium/hypotension   Call the [facility] office at [phone] if you have any questions, problems or concerns.

## 2020-01-16 LAB — HEPATIC FUNCTION PANEL
ALT: 15 IU/L (ref 0–32)
AST: 21 IU/L (ref 0–40)
Albumin: 4.1 g/dL (ref 3.8–4.9)
Alkaline Phosphatase: 113 IU/L (ref 39–117)
Bilirubin Total: 0.3 mg/dL (ref 0.0–1.2)
Bilirubin, Direct: 0.07 mg/dL (ref 0.00–0.40)
Total Protein: 6.6 g/dL (ref 6.0–8.5)

## 2020-01-16 LAB — MAGNESIUM: Magnesium: 2 mg/dL (ref 1.6–2.3)

## 2020-01-16 LAB — BASIC METABOLIC PANEL
BUN/Creatinine Ratio: 15 (ref 9–23)
BUN: 11 mg/dL (ref 6–24)
CO2: 23 mmol/L (ref 20–29)
Calcium: 9 mg/dL (ref 8.7–10.2)
Chloride: 102 mmol/L (ref 96–106)
Creatinine, Ser: 0.75 mg/dL (ref 0.57–1.00)
GFR calc Af Amer: 103 mL/min/{1.73_m2} (ref >59)
GFR calc non Af Amer: 89 mL/min/{1.73_m2} (ref >59)
Glucose: 92 mg/dL (ref 65–99)
Potassium: 4.3 mmol/L (ref 3.5–5.2)
Sodium: 139 mmol/L (ref 134–144)

## 2020-01-16 LAB — TSH: TSH: 3.32 u[IU]/mL (ref 0.450–4.500)

## 2020-01-22 ENCOUNTER — Encounter: Payer: Self-pay | Admitting: Family Medicine

## 2020-01-22 ENCOUNTER — Ambulatory Visit: Payer: 59 | Admitting: Family Medicine

## 2020-01-22 ENCOUNTER — Other Ambulatory Visit: Payer: Self-pay

## 2020-01-22 VITALS — BP 108/80 | HR 81 | Temp 97.0°F | Ht 66.0 in | Wt 213.0 lb

## 2020-01-22 DIAGNOSIS — E669 Obesity, unspecified: Secondary | ICD-10-CM | POA: Diagnosis not present

## 2020-01-22 DIAGNOSIS — R202 Paresthesia of skin: Secondary | ICD-10-CM

## 2020-01-22 DIAGNOSIS — I959 Hypotension, unspecified: Secondary | ICD-10-CM

## 2020-01-22 DIAGNOSIS — S46812A Strain of other muscles, fascia and tendons at shoulder and upper arm level, left arm, initial encounter: Secondary | ICD-10-CM | POA: Diagnosis not present

## 2020-01-22 NOTE — Patient Instructions (Addendum)
Let me know if you need/want a referral to Dr. Danielle Dess.   Please come back for labs at your convenience.   Heat (pad or rice pillow in microwave) over affected area, 10-15 minutes twice daily.   Drink 55-60 oz daily of water. Consider salt tabs.  Let us know if you need anything.  Trapezius stretches/exercises Do exercises exactly as told by your health care provider and adjust them as directed. It is normal to feel mild stretching, pulling, tightness, or discomfort as you do these exercises, but you should stop right away if you feel sudden pain or your pain gets worse.  Stretching and range of motion exercises These exercises warm up your muscles and joints and improve the movement and flexibility of your shoulder. These exercises can also help to relieve pain, numbness, and tingling. If you are unable to do any of the following for any reason, do not further attempt to do it.   Exercise A: Flexion, standing    1. Stand and hold a broomstick, a cane, or a similar object. Place your hands a little more than shoulder-width apart on the object. Your left / right hand should be palm-up, and your other hand should be palm-down. 2. Push the stick to raise your left / right arm out to your side and then over your head. Use your other hand to help move the stick. Stop when you feel a stretch in your shoulder, or when you reach the angle that is recommended by your health care provider. ? Avoid shrugging your shoulder while you raise your arm. Keep your shoulder blade tucked down toward your spine. 3. Hold for 30 seconds. 4. Slowly return to the starting position. Repeat 2 times. Complete this exercise 3 times per week.  Exercise B: Abduction, supine    1. Lie on your back and hold a broomstick, a cane, or a similar object. Place your hands a little more than shoulder-width apart on the object. Your left / right hand should be palm-up, and your other hand should be palm-down. 2. Push the  stick to raise your left / right arm out to your side and then over your head. Use your other hand to help move the stick. Stop when you feel a stretch in your shoulder, or when you reach the angle that is recommended by your health care provider. ? Avoid shrugging your shoulder while you raise your arm. Keep your shoulder blade tucked down toward your spine. 3. Hold for 30 seconds. 4. Slowly return to the starting position. Repeat 2 times. Complete this exercise 3 times per week.  Exercise C: Flexion, active-assisted    1. Lie on your back. You may bend your knees for comfort. 2. Hold a broomstick, a cane, or a similar object. Place your hands about shoulder-width apart on the object. Your palms should face toward your feet. 3. Raise the stick and move your arms over your head and behind your head, toward the floor. Use your healthy arm to help your left / right arm move farther. Stop when you feel a gentle stretch in your shoulder, or when you reach the angle where your health care provider tells you to stop. 4. Hold for 30 seconds. 5. Slowly return to the starting position. Repeat 2 times. Complete this exercise 3 times per week.  Exercise D: External rotation and abduction    1. Stand in a door frame with one of your feet slightly in front of the other. This is called  a staggered stance. 2. Choose one of the following positions as told by your health care provider: ? Place your hands and forearms on the door frame above your head. ? Place your hands and forearms on the door frame at the height of your head. ? Place your hands on the door frame at the height of your elbows. 3. Slowly move your weight onto your front foot until you feel a stretch across your chest and in the front of your shoulders. Keep your head and chest upright and keep your abdominal muscles tight. 4. Hold for 30 seconds. 5. To release the stretch, shift your weight to your back foot. Repeat 2 times. Complete this  stretch 3 times per week.  Strengthening exercises These exercises build strength and endurance in your shoulder. Endurance is the ability to use your muscles for a long time, even after your muscles get tired. Exercise E: Scapular depression and adduction  1. Sit on a stable chair. Support your arms in front of you with pillows, armrests, or a tabletop. Keep your elbows in line with the sides of your body. 2. Gently move your shoulder blades down toward your middle back. Relax the muscles on the tops of your shoulders and in the back of your neck. 3. Hold for 3 seconds. 4. Slowly release the tension and relax your muscles completely before doing this exercise again. Repeat for a total of 10 repetitions. 5. After you have practiced this exercise, try doing the exercise without the arm support. Then, try the exercise while standing instead of sitting. Repeat 2 times. Complete this exercise 3 times per week.  Exercise F: Shoulder abduction, isometric    1. Stand or sit about 4-6 inches (10-15 cm) from a wall with your left / right side facing the wall. 2. Bend your left / right elbow and gently press your elbow against the wall. 3. Increase the pressure slowly until you are pressing as hard as you can without shrugging your shoulder. 4. Hold for 3 seconds. 5. Slowly release the tension and relax your muscles completely. Repeat for a total of 10 repetitions. Repeat 2 times. Complete this exercise 3 times per week.  Exercise G: Shoulder flexion, isometric    1. Stand or sit about 4-6 inches (10-15 cm) away from a wall with your left / right side facing the wall. 2. Keep your left / right elbow straight and gently press the top of your fist against the wall. Increase the pressure slowly until you are pressing as hard as you can without shrugging your shoulder. 3. Hold for 10-15 seconds. 4. Slowly release the tension and relax your muscles completely. Repeat for a total of 10  repetitions. Repeat 2 times. Complete this exercise 3 times per week.  Exercise H: Internal rotation    1. Sit in a stable chair without armrests, or stand. Secure an exercise band at your left / right side, at elbow height. 2. Place a soft object, such as a folded towel or a small pillow, under your left / right upper arm so your elbow is a few inches (about 8 cm) away from your side. 3. Hold the end of the exercise band so the band stretches. 4. Keeping your elbow pressed against the soft object under your arm, move your forearm across your body toward your abdomen. Keep your body steady so the movement is only coming from your shoulder. 5. Hold for 3 seconds. 6. Slowly return to the starting position. Repeat for  Repeat for a total of 10 repetitions. Repeat 2 times. Complete this exercise 3 times per week.  Exercise I: External rotation    1. Sit in a stable chair without armrests, or stand. 2. Secure an exercise band at your left / right side, at elbow height. 3. Place a soft object, such as a folded towel or a small pillow, under your left / right upper arm so your elbow is a few inches (about 8 cm) away from your side. 4. Hold the end of the exercise band so the band stretches. 5. Keeping your elbow pressed against the soft object under your arm, move your forearm out, away from your abdomen. Keep your body steady so the movement is only coming from your shoulder. 6. Hold for 3 seconds. 7. Slowly return to the starting position. Repeat for a total of 10 repetitions. Repeat 2 times. Complete this exercise 3 times per week. Exercise J: Shoulder extension  1. Sit in a stable chair without armrests, or stand. Secure an exercise band to a stable object in front of you so the band is at shoulder height. 2. Hold one end of the exercise band in each hand. Your palms should face each other. 3. Straighten your elbows and lift your hands up to shoulder height. 4. Step back, away from the secured end of  the exercise band, until the band stretches. 5. Squeeze your shoulder blades together and pull your hands down to the sides of your thighs. Stop when your hands are straight down by your sides. Do not let your hands go behind your body. 6. Hold for 3 seconds. 7. Slowly return to the starting position. Repeat for a total of 10 repetitions. Repeat 2 times. Complete this exercise 3 times per week.  Exercise K: Shoulder extension, prone    1. Lie on your abdomen on a firm surface so your left / right arm hangs over the edge. 2. Hold a 5 lb weight in your hand so your palm faces in toward your body. Your arm should be straight. 3. Squeeze your shoulder blade down toward the middle of your back. 4. Slowly raise your arm behind you, up to the height of the surface that you are lying on. Keep your arm straight. 5. Hold for 3 seconds. 6. Slowly return to the starting position and relax your muscles. Repeat for a total of 10 repetitions. Repeat 2 times. Complete this exercise 3 times per week.   Exercise L: Horizontal abduction, prone  1. Lie on your abdomen on a firm surface so your left / right arm hangs over the edge. 2. Hold a 5 lb weight in your hand so your palm faces toward your feet. Your arm should be straight. 3. Squeeze your shoulder blade down toward the middle of your back. 4. Bend your elbow so your hand moves up, until your elbow is bent to an "L" shape (90 degrees). With your elbow bent, slowly move your forearm forward and up. Raise your hand up to the height of the surface that you are lying on. ? Your upper arm should not move, and your elbow should stay bent. ? At the top of the movement, your palm should face the floor. 5. Hold for 3 seconds. 6. Slowly return to the starting position and relax your muscles. Repeat for a total of 10 repetitions. Repeat 2 times. Complete this exercise 3 times per week.  Exercise M: Horizontal abduction, standing  1. Sit on a stable chair, or    stand. 2. Secure an exercise band to a stable object in front of you so the band is at shoulder height. 3. Hold one end of the exercise band in each hand. 4. Straighten your elbows and lift your hands straight in front of you, up to shoulder height. Your palms should face down, toward the floor. 5. Step back, away from the secured end of the exercise band, until the band stretches. 6. Move your arms out to your sides, and keep your arms straight. 7. Hold for 3 seconds. 8. Slowly return to the starting position. Repeat for a total of 10 repetitions. Repeat 2 times. Complete this exercise 3 times per week.  Exercise N: Scapular retraction and elevation  1. Sit on a stable chair, or stand. 2. Secure an exercise band to a stable object in front of you so the band is at shoulder height. 3. Hold one end of the exercise band in each hand. Your palms should face each other. 4. Sit in a stable chair without armrests, or stand. 5. Step back, away from the secured end of the exercise band, until the band stretches. 6. Squeeze your shoulder blades together and lift your hands over your head. Keep your elbows straight. 7. Hold for 3 seconds. 8. Slowly return to the starting position. Repeat for a total of 10 repetitions. Repeat 2 times. Complete this exercise 3 times per week.  This information is not intended to replace advice given to you by your health care provider. Make sure you discuss any questions you have with your health care provider. Document Released: 11/07/2005 Document Revised: 07/14/2016 Document Reviewed: 09/24/2015 Elsevier Interactive Patient Education  2017 [employer]   
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
## 2020-01-23 ENCOUNTER — Other Ambulatory Visit: Payer: Self-pay | Admitting: Family Medicine

## 2020-01-23 ENCOUNTER — Encounter: Payer: Self-pay | Admitting: Family Medicine

## 2020-01-23 DIAGNOSIS — I959 Hypotension, unspecified: Secondary | ICD-10-CM

## 2020-01-23 LAB — CBC WITH DIFFERENTIAL/PLATELET
Basophils Absolute: 0.1 10*3/uL (ref 0.0–0.1)
Basophils Relative: 1.1 % (ref 0.0–3.0)
Eosinophils Absolute: 0.2 10*3/uL (ref 0.0–0.7)
Eosinophils Relative: 3.3 % (ref 0.0–5.0)
HCT: 37.8 % (ref 36.0–46.0)
Hemoglobin: 12.4 g/dL (ref 12.0–15.0)
Lymphocytes Relative: 31.4 % (ref 12.0–46.0)
Lymphs Abs: 2.2 10*3/uL (ref 0.7–4.0)
MCHC: 32.7 g/dL (ref 30.0–36.0)
MCV: 85.3 fl (ref 78.0–100.0)
Monocytes Absolute: 0.5 10*3/uL (ref 0.1–1.0)
Monocytes Relative: 7.3 % (ref 3.0–12.0)
Neutro Abs: 3.9 10*3/uL (ref 1.4–7.7)
Neutrophils Relative %: 56.9 % (ref 43.0–77.0)
Platelets: 313 10*3/uL (ref 150.0–400.0)
RBC: 4.44 Mil/uL (ref 3.87–5.11)
RDW: 17.9 % — ABNORMAL HIGH (ref 11.5–15.5)
WBC: 6.9 10*3/uL (ref 4.0–10.5)

## 2020-01-23 LAB — CORTISOL: Cortisol, Plasma: 5.1 ug/dL

## 2020-01-23 LAB — HEMOGLOBIN A1C: Hgb A1c MFr Bld: 5.3 % (ref 4.6–6.5)

## 2020-01-23 MED ORDER — MIDODRINE HCL 5 MG PO TABS: 5.0000 mg | ORAL_TABLET | Freq: Two times a day (BID) | ORAL | 0 refills | Status: DC

## 2020-01-23 MED FILL — MIDODRINE HCL 5 MG TABLET: 5 | 15 days supply | Qty: 60 | Fill #0

## 2020-01-27 ENCOUNTER — Encounter: Payer: Self-pay | Admitting: Family Medicine

## 2020-01-27 ENCOUNTER — Ambulatory Visit (HOSPITAL_COMMUNITY)
Admission: RE | Admit: 2020-01-27 | Discharge: 2020-01-27 | Disposition: A | Discharge status: Home / Self Care | Payer: 59 | Source: Ambulatory Visit | Attending: Family Medicine | Admitting: Family Medicine

## 2020-01-27 ENCOUNTER — Other Ambulatory Visit: Payer: Self-pay

## 2020-01-27 DIAGNOSIS — I959 Hypotension, unspecified: Secondary | ICD-10-CM

## 2020-01-27 NOTE — Progress Notes (Signed)
  Echocardiogram 2D Echocardiogram has been performed.  Kristie Martinez 01/27/2020, 11:53 AM

## 2020-01-30 MED FILL — INDOMETHACIN 50 MG CAPSULE: 50 | 30 days supply | Qty: 90 | Fill #1

## 2020-01-30 MED FILL — METAXALONE 800 MG TABS: 800 | 30 days supply | Qty: 90 | Fill #1

## 2020-02-03 ENCOUNTER — Other Ambulatory Visit (HOSPITAL_BASED_OUTPATIENT_CLINIC_OR_DEPARTMENT_OTHER): Payer: 59

## 2020-02-04 ENCOUNTER — Encounter (HOSPITAL_COMMUNITY): Payer: Self-pay

## 2020-02-04 ENCOUNTER — Telehealth (HOSPITAL_COMMUNITY): Payer: Self-pay | Admitting: Emergency Medicine

## 2020-02-04 NOTE — Telephone Encounter (Signed)
Reaching out to patient to offer assistance regarding upcoming cardiac imaging study; pt verbalizes understanding of appt date/time, parking situation and where to check in, pre-test NPO status and medications ordered, and verified current allergies; name and call back number provided for further questions should they arise Rockwell Alexandria RN Navigator Cardiac Imaging [facility] [phone] office [phone] cell   Explained ivabradine and when to take it for HR control Pt appreciated the call  Huntley Dec

## 2020-02-05 ENCOUNTER — Ambulatory Visit (INDEPENDENT_AMBULATORY_CARE_PROVIDER_SITE_OTHER): Payer: 59

## 2020-02-05 ENCOUNTER — Other Ambulatory Visit: Payer: Self-pay

## 2020-02-05 ENCOUNTER — Other Ambulatory Visit (INDEPENDENT_AMBULATORY_CARE_PROVIDER_SITE_OTHER): Payer: 59

## 2020-02-05 ENCOUNTER — Ambulatory Visit (HOSPITAL_COMMUNITY)
Admission: RE | Admit: 2020-02-05 | Discharge: 2020-02-05 | Disposition: A | Discharge status: Home / Self Care | Payer: 59 | Source: Ambulatory Visit | Attending: Nurse Practitioner | Admitting: Nurse Practitioner

## 2020-02-05 DIAGNOSIS — I259 Chronic ischemic heart disease, unspecified: Secondary | ICD-10-CM | POA: Diagnosis not present

## 2020-02-05 DIAGNOSIS — D729 Disorder of white blood cells, unspecified: Secondary | ICD-10-CM

## 2020-02-05 DIAGNOSIS — R002 Palpitations: Secondary | ICD-10-CM

## 2020-02-05 DIAGNOSIS — D72818 Other decreased white blood cell count: Secondary | ICD-10-CM

## 2020-02-05 MED ORDER — NITROGLYCERIN 0.4 MG SL SUBL
SUBLINGUAL_TABLET | SUBLINGUAL | Status: AC
  Administered 2020-02-05: 0.8 mg via SUBLINGUAL
  Filled 2020-02-05: qty 2

## 2020-02-05 MED ORDER — NITROGLYCERIN 0.4 MG SL SUBL: 0.8000 mg | SUBLINGUAL_TABLET | Freq: Once | SUBLINGUAL | Status: AC

## 2020-02-05 MED ORDER — METOPROLOL TARTRATE 5 MG/5ML IV SOLN
INTRAVENOUS | Status: AC
  Administered 2020-02-05: 5 mg via INTRAVENOUS
  Filled 2020-02-05: qty 5

## 2020-02-05 MED ORDER — SODIUM CHLORIDE 0.9 % IV BOLUS
500.0000 mL | Freq: Once | INTRAVENOUS | Status: AC
  Administered 2020-02-05: 500 mL via INTRAVENOUS

## 2020-02-05 MED ORDER — METOPROLOL TARTRATE 5 MG/5ML IV SOLN: 5.0000 mg | INTRAVENOUS | Status: DC | PRN

## 2020-02-05 MED ORDER — IOHEXOL 350 MG/ML SOLN
80.0000 mL | Freq: Once | INTRAVENOUS | Status: AC | PRN
  Administered 2020-02-05: 80 mL via INTRAVENOUS

## 2020-02-05 NOTE — Progress Notes (Signed)
Patient here for ekg due to palpitations. Per Dr. Carmelia Roller patient's ekg looks good and if the tingling in her arm continues for one more week to contact neurologist

## 2020-02-05 NOTE — Progress Notes (Signed)
Spoke with Dr. Anne Fu in reference irregular heart rate fluctuating between 40s-80s. Patient took 10 mg ivabradine this AM as well as midodrine. Patient states she feels jittery at this time. CT tech states " we will be unable to get adequate pictures with fluctuation. Dr. Anne Fu orders 5 mg IV metoprolol followed by 500 ml saline bolus post scan.

## 2020-02-05 NOTE — Patient Instructions (Signed)
Per Dr. Carmelia Roller patient's ekg looks good and if the tingling in her arm continues for one more week to contact neurologist.

## 2020-02-06 LAB — CBC
HCT: 37.5 % (ref 36.0–46.0)
Hemoglobin: 12.4 g/dL (ref 12.0–15.0)
MCHC: 33.1 g/dL (ref 30.0–36.0)
MCV: 87.5 fl (ref 78.0–100.0)
Platelets: 307 10*3/uL (ref 150.0–400.0)
RBC: 4.28 Mil/uL (ref 3.87–5.11)
RDW: 17.9 % — ABNORMAL HIGH (ref 11.5–15.5)
WBC: 6.3 10*3/uL (ref 4.0–10.5)

## 2020-02-06 LAB — IBC + FERRITIN
Ferritin: 13.2 ng/mL (ref 10.0–291.0)
Iron: 240 ug/dL — ABNORMAL HIGH (ref 42–145)
Saturation Ratios: 58.3 % — ABNORMAL HIGH (ref 20.0–50.0)
Transferrin: 294 mg/dL (ref 212.0–360.0)

## 2020-02-09 ENCOUNTER — Telehealth: Payer: 59 | Admitting: Physician Assistant

## 2020-02-09 DIAGNOSIS — J029 Acute pharyngitis, unspecified: Secondary | ICD-10-CM | POA: Diagnosis not present

## 2020-02-09 MED ORDER — PENICILLIN V POTASSIUM 500 MG PO TABS: 500.0000 mg | ORAL_TABLET | Freq: Two times a day (BID) | ORAL | 0 refills | Status: DC

## 2020-02-09 NOTE — Progress Notes (Signed)
We are sorry that you are not feeling well.  Here is how we plan to help!  Please read the information below. It sounds like you have pharyngitis, this can be difficult sometimes to determine if it is viral or bacterial You could be seen in person and have a strep test to diagnose this, but given your complaints and physical exam findings I think moving forward it would be appropriate to initiate antibiotic therapy. If you notice things are not getting better in 48 hours or significantly worsen please head into urgent care or the emergency room for evaluation. If you notice one side of your throat is larger than the other, you have difficulty swallowing or breathing, cannot tolerate food or drink, please follow up immediately.    Based on what you have shared with me it is likely that you have strep pharyngitis.  Strep pharyngitis is inflammation and infection in the back of the throat.  This is an infection cause by bacteria and is treated with antibiotics.  I have prescribed Penicillin V 500 mg twice a day for 10 days. For throat pain, we recommend over the counter oral pain relief medications such as acetaminophen or aspirin, or anti-inflammatory medications such as ibuprofen or naproxen sodium. Topical treatments such as oral throat lozenges or sprays may be used as needed. Strep infections are not as easily transmitted as other respiratory infections, however we still recommend that you avoid close contact with loved ones, especially the very young and elderly.  Remember to wash your hands thoroughly throughout the day as this is the number one way to prevent the spread of infection and wipe down door knobs and counters with disinfectant.   Home Care:  Only take medications as instructed by your medical team.  Complete the entire course of an antibiotic.  Do not take these medications with alcohol.  A steam or ultrasonic humidifier can help congestion.  You can place a towel over your head and  breathe in the steam from hot water coming from a faucet.  Avoid close contacts especially the very young and the elderly.  Cover your mouth when you cough or sneeze.  Always remember to wash your hands.  Get Help Right Away If:  You develop worsening fever or sinus pain.  You develop a severe head ache or visual changes.  Your symptoms persist after you have completed your treatment plan.  Make sure you  Understand these instructions.  Will watch your condition.  Will get help right away if you are not doing well or get worse.  Your e-visit answers were reviewed by a board certified advanced clinical practitioner to complete your personal care plan.  Depending on the condition, your plan could have included both over the counter or prescription medications.  If there is a problem please reply  once you have received a response from your provider.  Your safety is important to [place].  If you have drug allergies check your prescription carefully.    You can use MyChart to ask questions about today's visit, request a non-urgent call back, or ask for a work or school excuse for 24 hours related to this e-Visit. If it has been greater than 24 hours you will need to follow up with your provider, or enter a new e-Visit to address those concerns.  You will get an e-mail in the next two days asking about your experience.  I hope that your e-visit has been valuable and will speed your recovery. Thank  you for using e-visits.  Greater than 5 minutes, yet less than 10 minutes of time have been spent researching, coordinating, and implementing care for this patient today

## 2020-02-18 ENCOUNTER — Ambulatory Visit (INDEPENDENT_AMBULATORY_CARE_PROVIDER_SITE_OTHER): Payer: 59 | Admitting: Family Medicine

## 2020-02-18 ENCOUNTER — Encounter: Payer: Self-pay | Admitting: Family Medicine

## 2020-02-18 ENCOUNTER — Other Ambulatory Visit: Payer: Self-pay

## 2020-02-18 DIAGNOSIS — J040 Acute laryngitis: Secondary | ICD-10-CM

## 2020-02-18 MED ORDER — PHENTERMINE HCL 30 MG PO CAPS: 30.0000 mg | ORAL_CAPSULE | ORAL | 0 refills | Status: DC

## 2020-02-18 MED ORDER — CEFDINIR 300 MG PO CAPS: 300.0000 mg | ORAL_CAPSULE | Freq: Two times a day (BID) | ORAL | 0 refills | Status: AC

## 2020-02-18 MED ORDER — FLUCONAZOLE 150 MG PO TABS: ORAL_TABLET | ORAL | 0 refills | Status: DC

## 2020-02-18 MED FILL — PHENTERMINE 30 MG CAPSULE: 30 | 30 days supply | Qty: 30 | Fill #0

## 2020-02-18 MED FILL — CEFDINIR 300 MG CAPSULE: 300 | 7 days supply | Qty: 14 | Fill #0

## 2020-02-18 MED FILL — FLUCONAZOLE 150 MG TABLET: 150 | 3 days supply | Qty: 2 | Fill #0

## 2020-02-18 NOTE — Progress Notes (Signed)
SUBJECTIVE:   Kristie Martinez is a 59 y.o. female presents to the clinic for:  Chief Complaint  Patient presents with  . Sore Throat    congestion  . Laryngitis   Due to COVID-19 pandemic, we are interacting via web portal for an electronic face-to-face visit. I verified patient's ID using 2 identifiers. Patient agreed to proceed with visit via this method. Patient is at work, I am at office. Patient and I are present for visit.  Complains of sore throat for 6 days.  Other associated symptoms: rhinorrhea and drainage, ST, losing voice.  Denies: sinus congestion, sinus pain, itchy watery eyes, ear pain, ear drainage, wheezing, shortness of breath, myalgia and fevers Sick Contacts: +folks w strep Therapy to date: PCN  Social History   Tobacco Use  Smoking Status Never Smoker  Smokeless Tobacco Never Used    ROS: Pertinent items are noted in HPI  Patient's medications, allergies, past medical, surgical, social and family histories were reviewed and updated as appropriate.  OBJECTIVE:  No conversational dyspnea Age appropriate judgment and insight Nml affect and mood  ASSESSMENT/PLAN:  Laryngitis - Plan: fluconazole (DIFLUCAN) 150 MG tablet, cefdinir (OMNICEF) 300 MG capsule  Orders as above. Continue to practice good hand hygiene and push fluids. Ibuprofen and acetaminophen for pain. Replace toothbrush after 24 hours of being on abx. F/u prn. Pt voiced understanding and agreement to the plan.  Jilda Roche [place], DO 02/18/20 3:06 PM

## 2020-03-26 ENCOUNTER — Telehealth: Payer: Self-pay | Admitting: Family Medicine

## 2020-03-26 MED ORDER — PHENTERMINE HCL 30 MG PO CAPS: 30.0000 mg | ORAL_CAPSULE | ORAL | 0 refills | Status: DC

## 2020-03-26 MED FILL — PHENTERMINE 30 MG CAPSULE: 30 | 30 days supply | Qty: 30 | Fill #0

## 2020-03-26 NOTE — Telephone Encounter (Signed)
Medication: phentermine 30 MG capsule   Has the patient contacted their pharmacy? No. (If no, request that the patient contact the pharmacy for the refill.) (If yes, when and what did the pharmacy advise?)  Preferred Pharmacy (with phone number or street name): [facility] - [place], [place] - [address] [facility].  [address] [place] [address]  Phone:  [phone] Fax:  [phone]   Agent: Please be advised that RX refills may take up to 3 business days. We ask that you follow-up with your pharmacy.

## 2020-04-09 MED FILL — METAXALONE 800 MG TABS: 800 | 30 days supply | Qty: 90 | Fill #2

## 2020-04-09 MED FILL — SULINDAC 200 MG TABLET: 200 | 30 days supply | Qty: 60 | Fill #2

## 2020-04-09 MED FILL — INDOMETHACIN 50 MG CAPSULE: 50 | 30 days supply | Qty: 90 | Fill #2

## 2020-04-15 ENCOUNTER — Other Ambulatory Visit: Payer: Self-pay | Admitting: Neurological Surgery

## 2020-04-15 ENCOUNTER — Other Ambulatory Visit (HOSPITAL_COMMUNITY): Payer: Self-pay | Admitting: Neurological Surgery

## 2020-04-15 DIAGNOSIS — M5030 Other cervical disc degeneration, unspecified cervical region: Secondary | ICD-10-CM | POA: Diagnosis not present

## 2020-04-15 DIAGNOSIS — M5412 Radiculopathy, cervical region: Secondary | ICD-10-CM | POA: Diagnosis not present

## 2020-04-24 ENCOUNTER — Ambulatory Visit (HOSPITAL_COMMUNITY)
Admission: RE | Admit: 2020-04-24 | Discharge: 2020-04-24 | Disposition: A | Discharge status: Home / Self Care | Payer: 59 | Source: Ambulatory Visit | Attending: Neurological Surgery | Admitting: Neurological Surgery

## 2020-04-24 ENCOUNTER — Other Ambulatory Visit: Payer: Self-pay

## 2020-04-24 DIAGNOSIS — M4802 Spinal stenosis, cervical region: Secondary | ICD-10-CM | POA: Diagnosis not present

## 2020-04-24 DIAGNOSIS — M5030 Other cervical disc degeneration, unspecified cervical region: Secondary | ICD-10-CM | POA: Diagnosis not present

## 2020-04-27 ENCOUNTER — Ambulatory Visit: Payer: 59 | Admitting: Physical Therapy

## 2020-04-28 ENCOUNTER — Ambulatory Visit: Payer: 59 | Admitting: Physical Therapy

## 2020-04-29 ENCOUNTER — Other Ambulatory Visit: Payer: Self-pay

## 2020-04-29 ENCOUNTER — Ambulatory Visit: Payer: 59 | Attending: Neurological Surgery | Admitting: Physical Therapy

## 2020-04-29 DIAGNOSIS — M62838 Other muscle spasm: Secondary | ICD-10-CM

## 2020-04-29 DIAGNOSIS — M542 Cervicalgia: Secondary | ICD-10-CM
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
## 2020-04-29 NOTE — Patient Instructions (Signed)
Access Code: [email or web address] URL: [email or web address] Date: 04/29/2020 Prepared by: Roderic Scarce  Exercises Thoracic Mobilization on Foam Roll - Hands Clasped - 1 x daily - 10 reps Thoracic Extension Mobilization with Noodle - 1 x daily - 10 reps Supine Cervical Retraction with Towel - 1 x daily - 10 reps - 2-3 hold Standing Backward Shoulder Rolls - 4-5 x daily - 10 reps Standing Scapular Retraction - 4-5 x daily - 10 reps Seated Cervical Sidebending Stretch - 1 x daily - 1-2 reps - 30 hold

## 2020-04-30 ENCOUNTER — Ambulatory Visit: Payer: 59

## 2020-04-30 DIAGNOSIS — H02052 Trichiasis without entropian right lower eyelid: Secondary | ICD-10-CM | POA: Diagnosis not present

## 2020-04-30 DIAGNOSIS — H02055 Trichiasis without entropian left lower eyelid: Secondary | ICD-10-CM | POA: Diagnosis not present

## 2020-05-05 ENCOUNTER — Ambulatory Visit (HOSPITAL_COMMUNITY): Payer: 59

## 2020-05-06 DIAGNOSIS — M5412 Radiculopathy, cervical region: Secondary | ICD-10-CM | POA: Diagnosis not present

## 2020-05-12 ENCOUNTER — Ambulatory Visit (INDEPENDENT_AMBULATORY_CARE_PROVIDER_SITE_OTHER): Payer: 59 | Admitting: Family Medicine

## 2020-05-12 ENCOUNTER — Ambulatory Visit: Payer: 59 | Admitting: Physical Therapy

## 2020-05-12 ENCOUNTER — Other Ambulatory Visit: Payer: Self-pay

## 2020-05-12 ENCOUNTER — Encounter (INDEPENDENT_AMBULATORY_CARE_PROVIDER_SITE_OTHER): Payer: Self-pay | Admitting: Family Medicine

## 2020-05-12 ENCOUNTER — Encounter: Payer: Self-pay | Admitting: Physical Therapy

## 2020-05-12 DIAGNOSIS — Z833 Family history of diabetes mellitus: Secondary | ICD-10-CM

## 2020-05-12 DIAGNOSIS — M542 Cervicalgia: Secondary | ICD-10-CM

## 2020-05-12 DIAGNOSIS — R0602 Shortness of breath: Secondary | ICD-10-CM | POA: Diagnosis not present

## 2020-05-12 DIAGNOSIS — G4700 Insomnia, unspecified: Secondary | ICD-10-CM | POA: Diagnosis not present

## 2020-05-12 DIAGNOSIS — R5383 Other fatigue: Secondary | ICD-10-CM | POA: Diagnosis not present

## 2020-05-12 DIAGNOSIS — Z0289 Encounter for other administrative examinations: Secondary | ICD-10-CM

## 2020-05-12 DIAGNOSIS — Z9189 Other specified personal risk factors, not elsewhere classified: Secondary | ICD-10-CM

## 2020-05-12 DIAGNOSIS — Z8249 Family history of ischemic heart disease and other diseases of the circulatory system: Secondary | ICD-10-CM | POA: Diagnosis not present

## 2020-05-12 DIAGNOSIS — Z9884 Bariatric surgery status: Secondary | ICD-10-CM | POA: Diagnosis not present

## 2020-05-12 DIAGNOSIS — E785 Hyperlipidemia, unspecified: Secondary | ICD-10-CM | POA: Diagnosis not present

## 2020-05-12 DIAGNOSIS — Z6833 Body mass index (BMI) 33.0-33.9, adult: Secondary | ICD-10-CM

## 2020-05-12 DIAGNOSIS — E669 Obesity, unspecified: Secondary | ICD-10-CM

## 2020-05-12 DIAGNOSIS — M62838 Other muscle spasm: Secondary | ICD-10-CM | POA: Diagnosis not present

## 2020-05-12 DIAGNOSIS — F3289 Other specified depressive episodes: Secondary | ICD-10-CM | POA: Diagnosis not present

## 2020-05-12 DIAGNOSIS — D649 Anemia, unspecified: Secondary | ICD-10-CM

## 2020-05-12 NOTE — Therapy (Signed)
[facility] [address]  [address] [place], [place], [address] Phone: [phone]   Fax:  [phone]  Physical Therapy Treatment  Patient Details  Name: Kristie Martinez MRN: [id number] Date of Birth: 05-01-1961 Referring Provider (PT): Dr Barnett Abu   Encounter Date: 05/12/2020   PT End of Session - 05/12/20 1615    Visit Number 2    Number of Visits 5    Date for PT Re-Evaluation 06/03/20    Authorization Type [facility]    PT Start Time 1615    PT Stop Time 1718    PT Time Calculation (min) 63 min    Activity Tolerance Patient tolerated treatment well    Behavior During Therapy [facility] for tasks assessed/performed           Past Medical History:  Diagnosis Date  . Anemia   . Hamstring injury   . Hx of gastric bypass 2004  . Indigestion   . Obesity   . Post-operative nausea and vomiting   . Varicose veins     Past Surgical History:  Procedure Laterality Date  . CERVICAL DISC ARTHROPLASTY  2019  . ENDOMETRIAL ABLATION  2004  . ENDOVENOUS ABLATION SAPHENOUS VEIN W/ LASER  09-27-2012   left greater saphenous vein  by Gretta Began MD  . ENDOVENOUS ABLATION SAPHENOUS VEIN W/ LASER  11-08-2012   right greater saphenous vein   by Gretta Began MD  . mini gastric bypass   2004  . OTHER SURGICAL HISTORY  2004   mini gastric bypass  . TONSILLECTOMY    . TONSILLECTOMY AND ADENOIDECTOMY  1970    There were no vitals filed for this visit.   Subjective Assessment - 05/12/20 1620    Diagnostic tests MRI ACDF C5-7 looks good, osteophyte C4-5 with minor stenosis    Patient Stated Goals get rid of numbness and tingleing and walk up a hill without increased symptoms in arm.    Currently in Pain? Yes    Pain Score 4     Pain Location Neck   & L scapula   Pain Orientation Left    Pain Descriptors / Indicators Stabbing    Pain Type Acute pain    Pain Radiating Towards "needle pricking electricity into L forearm"    Pain Onset  More than a month ago    Pain Frequency Intermittent    Aggravating Factors  leaning fwd, walking inclines                             [facility] Adult PT Treatment/Exercise - 05/12/20 1615      Exercises   Exercises Neck      Neck Exercises: Machines for Strengthening   UBE (Upper Arm Bike) L1.0 x 4 min (2' fwd/2' back)      Neck Exercises: Theraband   Scapula Retraction 10 reps   yellow TB   Scapula Retraction Limitations elbows extended with mini-shoulder extension    Rows 10 reps   yellow TB   Rows Limitations cues for scap retraction/depression avoiding shoulder shrug      Manual Therapy   Manual Therapy Soft tissue mobilization;Myofascial release;Other (comment)    Manual therapy comments skilled palpation and monitoring during DN    Soft tissue mobilization STM/DTM to L cervical paraspinals, UT, LS, pecs, teres group and infraspinatus    Myofascial Release Manual TPR to L UT, LS, pecs, teres group and  infraspinatus    Other Manual Therapy Review of self-STM using tennis ball on wall with new instructions for L pecs      Neck Exercises: Stretches   Corner Stretch 30 seconds;1 rep    Corner Stretch Limitations L single arm low doorway pec stretch - atttempted B but reproducing numbness in L UE            Trigger Point Dry Needling - 05/12/20 1615    Consent Given? Yes    Education Handout Provided Previously provided    Muscles Treated Head and Neck Upper trapezius;Levator scapulae   Lt   Muscles Treated Upper Quadrant Pectoralis major;Infraspinatus;Teres major;Teres minor   Lt   Upper Trapezius Response Twitch reponse elicited;Palpable increased muscle length    Levator Scapulae Response Twitch response elicited;Palpable increased muscle length    Pectoralis Major Response Twitch response elicited;Palpable increased muscle length    Infraspinatus Response Twitch response elicited;Palpable increased muscle length    Teres major Response Twitch response  elicited;Palpable increased muscle length    Teres minor Response Twitch response elicited;Palpable increased muscle length                PT Education - 05/12/20 1711    Education Details HEP update - single arm low doorway pec stretch, yellow TB row/retraction    Person(s) Educated Patient    Methods Explanation;Demonstration;Verbal cues;Tactile cues;Handout    Comprehension Verbalized understanding;Returned demonstration;Verbal cues required;Tactile cues required;Need further instruction               PT Long Term Goals - 04/29/20 1733      PT LONG TERM GOAL #1   Title Independent with ongoing HEP    Time 5    Period Weeks    Status New    Target Date 06/03/20      PT LONG TERM GOAL #2   Title improve cervical rotation =/> 70 degrees to ease looking for traffic    Time 5    Period Weeks    Status New    Target Date 06/03/20      PT LONG TERM GOAL #3   Title report =/> 75% reduction in numbness/tingling into Lt UE    Time 5    Period Weeks    Status New    Target Date 06/03/20      PT LONG TERM GOAL #4   Title report ability to sleep and bend forward with minimal to no cervical pain    Time 5    Period Weeks    Status New    Target Date 06/03/20      PT LONG TERM GOAL #5   Title improve FOTO =/> 45% limited    Time 5    Period Weeks    Status New    Target Date 06/03/20                 Plan - 05/12/20 1718    Clinical Impression Statement Elyanah reports most of HEP well tolerated but unable to tolerate thoracic extension and mobilization due to exacerbation of L UE radicular symptoms. She notes particular benefit from self-STM with ball in pillowcase over shoulder and scapular retraction. She reports bicep level pain has been well controlled since initial visit where DN performed but notes ongoing pain and sensitivity t/o rest of L lateral neck and shoulder complex. Manual therapy including DN performed to release tension and help alleviate  pain with multiple twitch responses elicited and pt reporting significant  reduction in pain and muscle tension/feeling of "tetany" in the muscles. HEP updated to include gentle pec stretching and continued emphasis on thoracic and cervical stabilization.    Personal Factors and Comorbidities Comorbidity 3+    Comorbidities h/o C5-7 ACDF    Examination-Activity Limitations Bend;Other;Reach Overhead    Examination-Participation Restrictions Community Activity;Other    Stability/Clinical Decision Making Stable/Uncomplicated    Rehab Potential Good    PT Frequency 1x / week    PT Duration --   5 wks as she will be out of town next wk   PT Treatment/Interventions Taping;Patient/family education;Moist Heat;Ultrasound;Passive range of motion;Therapeutic exercise;Cryotherapy;Electrical Stimulation;Manual techniques;Dry needling;ADLs/Self Care Home Management;Therapeutic activities    PT Next Visit Plan cervical stabilization, thoracic mobilization, manual work PRN    Consulted and Agree with Plan of Care Patient           Patient will benefit from skilled therapeutic intervention in order to improve the following deficits and impairments:  Decreased range of motion, Impaired UE functional use, Increased muscle spasms, Pain, Postural dysfunction, Hypomobility  Visit Diagnosis: Cervicalgia  Other muscle spasm     Problem List Patient Active Problem List   Diagnosis Date Noted  . Obesity (BMI 30-39.9) 01/22/2020  . Hypokalemia   . Paresthesia of left arm   . Class 1 obesity due to excess calories without serious comorbidity with body mass index (BMI) of 32.0 to 32.9 in adult   . Pure hypercholesterolemia   . Family history of early CAD   . Hot flashes 05/21/2018  . Atypical chest pain 11/17/2016  . Status post gastric bypass for obesity 03/07/2013  . Varicose veins of lower extremities with other complications 55/73/2202    Percival Spanish, PT, MPT 05/12/2020, 5:56 PM  [facility] [address]  [address] [place] [address] [place], [place], [address] Phone: [phone]   Fax:  [phone]  Name: JAMYIAH LABELLA MRN: [id number] Date of Birth: 1961/11/12

## 2020-05-12 NOTE — Patient Instructions (Signed)
    Home exercise program created by Emrik Erhard, PT.  For questions, please contact Georgetta Crafton via phone at [phone] or email at [email or web address]  [facility] [place] [address]  [address] [place], [place], [address] Phone: [phone]   Fax:  [phone]    

## 2020-05-12 NOTE — Progress Notes (Signed)
Office: [phone]  /  Fax: [phone]    Date: May 19, 2020   Appointment Start Time: 3:03pm Duration: 42 minutes Provider: Lawerance Cruel, Psy.D. Type of Session: Intake for Individual Therapy  Location of Patient: Work Location of Provider: [facility] Type of Contact: Telepsychological Visit via MyChart Video Visit  Informed Consent: Kristie Martinez called this provider at 3:02pm due to connection issues. Assistance was provided. As such, today's appointment was initiated 3 minutes late. Prior to proceeding with today's appointment, two pieces of identifying information were obtained. In addition, Kristie Martinez's physical location at the time of this appointment was obtained as well a phone number she could be reached at in the event of technical difficulties. Kristie Martinez and this provider participated in today's telepsychological service. Of note, today's appointment was switched to a regular telephone call at 3:07pm with Kristie Martinez's verbal consent due to an unstable connection.  The provider's role was explained to [employer]. The provider reviewed and discussed issues of confidentiality, privacy, and limits therein (e.g., reporting obligations). In addition to verbal informed consent, written informed consent for psychological services was obtained prior to the initial appointment. Since the clinic is not a 24/7 crisis center, mental health emergency resources were shared and this  provider explained MyChart, e-mail, voicemail, and/or other messaging systems should be utilized only for non-emergency reasons. This provider also explained that information obtained during appointments will be placed in Soraida's medical record and relevant information will be shared with other providers at [facility] for coordination of care. Moreover, Jamani agreed information may be shared with other [facility] providers as needed for coordination of care. By signing the  service agreement document, Kilyn provided written consent for coordination of care. Prior to initiating telepsychological services, Kristie Martinez completed an informed consent document, which included the development of a safety plan (i.e., an emergency contact, nearest emergency room, and emergency resources) in the event of an emergency/crisis. Kristie Martinez expressed understanding of the rationale of the safety plan. Kristie Martinez verbally acknowledged understanding she is ultimately responsible for understanding her insurance benefits for telepsychological and in-person services. This provider also reviewed confidentiality, as it relates to telepsychological services, as well as the rationale for telepsychological services (i.e., to reduce exposure risk to COVID-19). Kristie Martinez  acknowledged understanding that appointments cannot be recorded without both party consent and she is aware she is responsible for securing confidentiality on her end of the session. Kristie Martinez verbally consented to proceed.  Chief Complaint/HPI: Kristie Martinez was referred by Dr. Thomasene Lot due to other depression, with emotional eating. Per the note for the initial visit with Dr. Thomasene Lot on May 12, 2020, "See PHQ-9.  Kristie Martinez has a lot of guilt." The note for the initial appointment with Dr. Thomasene Lot indicated the following: "Yue's habits were reviewed today and are as follows: Her family eats meals together, she struggles with family and or coworkers weight loss sabotage, her desired weight loss is 21 pounds, she has been heavy most of her life, she started gaining weight when she started taking birth control at 59 years old, her heaviest weight ever was 263 pounds, she craves [place] and salty foods, she snacks frequently in the evenings, she wakes up frequently in the middle of the night to eat, she skips meals frequently, she is frequently drinking liquids with calories, she frequently makes poor food choices and she struggles with emotional  eating." Kristie Martinez's Food and Mood (modified PHQ-9) score on May 12, 2020 was 16.  During today's appointment, Kristie Martinez was verbally administered a questionnaire assessing various behaviors related to emotional eating. Kristie Martinez endorsed the following: use food to help you cope with emotional situations, find food is comforting to you, overeat when you are angry or upset, overeat when you are worried about something and eat to help you stay awake. Kristie Martinez shared her mother always felt the family needed to eat and she was expected to clear her plate. She described the current frequency of emotional eating as "couple times a week," noting she is a "night time eater." In addition, Kristie Martinez denied a history of binge eating. Kristie Martinez denied a history of restricting food intake, purging and engagement in other compensatory strategies, and has never been diagnosed with an eating disorder. She also denied a history of treatment for emotional eating. Moreover, Kristie Martinez indicated stress and feeling tired triggers emotional eating, whereas losing weight and keeping it off makes emotional eating better. She shared a history of weight loss surgery in 2004. Furthermore, Kristie Martinez described work as stressful, adding she has challenges saying "no."  Mental Status Examination:  Appearance: well groomed and appropriate hygiene  Behavior: appropriate to circumstances Mood: euthymic Affect: mood congruent Speech: normal in rate, volume, and tone Eye Contact: appropriate Psychomotor Activity: appropriate Gait: unable to assess Thought Process: linear, logical, and goal directed  Thought Content/Perception: denies suicidal and homicidal ideation, plan, and intent and no hallucinations, delusions, bizarre thinking or behavior reported or observed Orientation: time, person, place, and purpose of appointment Memory/Concentration: memory, attention, language, and fund of knowledge intact  Insight/Judgment: good  Family & Psychosocial  History: Emagene reported she is married and she had three adult children. She indicated she is currently employed with [employer] and also has teaching jobs. Additionally, Jayline shared her highest level of education obtained is a PhD in nursing. Currently, Kennadee's social support system consists of her husband, daughter, son, sister, and couple close friends. Moreover, Allayah stated she resides with her husband.   Medical History:  Past Medical History:  Diagnosis Date  . Anemia   . Hamstring injury   . Hx of gastric bypass 2004  . Indigestion   . Obesity   . Post-operative nausea and vomiting   . Varicose veins    Past Surgical History:  Procedure Laterality Date  . CERVICAL DISC ARTHROPLASTY  2019  . ENDOMETRIAL ABLATION  2004  . ENDOVENOUS ABLATION SAPHENOUS VEIN W/ LASER  09-27-2012   left greater saphenous vein  by Gretta Began MD  . ENDOVENOUS ABLATION SAPHENOUS VEIN W/ LASER  11-08-2012   right greater saphenous vein   by Gretta Began MD  . mini gastric bypass   2004  . OTHER SURGICAL HISTORY  2004   mini gastric bypass  . TONSILLECTOMY    . TONSILLECTOMY AND ADENOIDECTOMY  1970   Current Outpatient Medications on File Prior to Visit  Medication Sig Dispense Refill  . Biotin 66440 MCG TABS Take by mouth.    . calcium carbonate (TUMS - DOSED IN MG ELEMENTAL CALCIUM) 500 MG chewable tablet Chew 1 tablet by mouth daily.    . cholecalciferol (VITAMIN D) 1000 units tablet Take 5,000 Units by mouth 4 (four) times daily.     . Cyanocobalamin (VITAMIN B-12 PO) Take 5,000 Units by mouth daily.     . diphenhydrAMINE (BENADRYL) 25 mg capsule Take 25 mg by mouth every 6 (six) hours as needed.    . docusate sodium (COLACE) 100 MG capsule Take 100 mg by mouth 2 (two)  times daily as needed.     . famotidine (PEPCID) 20 MG tablet Take 20 mg by mouth as needed for heartburn or indigestion.    . fluconazole (DIFLUCAN) 150 MG tablet Take 1 tab, repeat in 48 hours if no improvement. 2 tablet 0    . indomethacin (INDOCIN) 25 MG capsule Take 25 mg by mouth 2 (two) times daily with a meal.    . Magnesium 400 MG TABS Take 1,200 mg by mouth at bedtime.    . metaxalone (SKELAXIN) 800 MG tablet Take 800 mg by mouth 2 (two) times daily.    . methylcellulose oral powder Take 1 packet by mouth daily.     . midodrine (PROAMATINE) 5 MG tablet Take 1-2 tablets (5-10 mg total) by mouth 2 (two) times daily with a meal. 60 tablet 0  . Multiple Vitamin (MULTIVITAMIN WITH MINERALS) TABS tablet Take 1 tablet by mouth daily.    . phentermine 30 MG capsule Take 1 capsule (30 mg total) by mouth every morning. 30 capsule 0  . PRESCRIPTION MEDICATION Apply 1-2 Pump topically as needed. Cyclobenzaprine 2%/Diclofenac 3% cream - 3-4x daily PRN    . traZODone (DESYREL) 50 MG tablet Take 0.5-1 tablets (25-50 mg total) by mouth at bedtime as needed for sleep. 30 tablet 3  . vitamin k 100 MCG tablet Take 50 mcg by mouth daily.     Current Facility-Administered Medications on File Prior to Visit  Medication Dose Route Frequency Provider Last Rate Last Admin  . 0.9 %  sodium chloride infusion  500 mL Intravenous Once Rachael Fee, MD       Mental Health History: Kristie Martinez denied a history of therapeutic and psychiatric services. Kristie Martinez reported there is no history of hospitalizations for psychiatric concerns. Kristie Martinez endorsed a family history of mental health related concerns. She noted bipolar and alcoholism runs on her mother's side of the family. Kristie Martinez reported there is no history of trauma including psychological, physical  and sexual abuse, as well as neglect.   Kristie Martinez described her typical mood lately as improved due to a reduction in work stressors and an upcoming vacation. Kristie Martinez endorsed consuming wine occasionally (e.g., when she goes camping). She denied tobacco use. She denied illicit/recreational substance use. Regarding caffeine intake, Milea reported consuming diet soda, coffee, and green tea daily.  Furthermore, Kristie Martinez indicated she is not experiencing the following: hallucinations and delusions, paranoia, symptoms of mania , social withdrawal, crying spells, panic attacks and decreased motivation. She also denied history of and current suicidal ideation, plan, and intent; history of and current homicidal ideation, plan, and intent; and history of and current engagement in self-harm.  The following strengths were reported by Kristie Martinez: facilitator, transformation leader, career, marriage, and children. The following strengths were observed by this provider: ability to express thoughts and feelings during the therapeutic session, ability to establish and benefit from a therapeutic relationship, willingness to work toward established goal(s) with the clinic and ability to engage in reciprocal conversation.   Legal History: Glendia reported there is no history of legal involvement.   Structured Assessments Results: The Patient Health Questionnaire-9 (PHQ-9) is a self-report measure that assesses symptoms and severity of depression over the course of the last two weeks. Emari obtained a score of 2 suggesting minimal depression. Latora finds the endorsed symptoms to be somewhat difficult. [0= Not at all; 1= Several days; 2= More than half the days; 3= Nearly every day] Little interest or pleasure in doing things 0  Feeling down,  depressed, or hopeless 0  Trouble falling or staying asleep, or sleeping too much 2  Feeling tired or having little energy 0  Poor appetite or overeating 0  Feeling bad about yourself --- or that you are a failure or have let yourself or your family down 0  Trouble concentrating on things, such as reading the newspaper or watching television 0  Moving or speaking so slowly that other people could have noticed? Or the opposite --- being so fidgety or restless that you have been moving around a lot more than usual 0  Thoughts that you would be better off dead or hurting yourself  in some way 0  PHQ-9 Score 2    The Generalized Anxiety Disorder-7 (GAD-7) is a brief self-report measure that assesses symptoms of anxiety over the course of the last two weeks. Ely obtained a score of 3 suggesting minimal anxiety. Kimari finds the endorsed symptoms to be not difficult at all. [0= Not at all; 1= Several days; 2= Over half the days; 3= Nearly every day] Feeling nervous, anxious, on edge 0  Not being able to stop or control worrying 0  Worrying too much about different things 0  Trouble relaxing 3  Being so restless that it's hard to sit still 0  Becoming easily annoyed or irritable 0  Feeling afraid as if something awful might happen 0  GAD-7 Score 3   Interventions:  Conducted a chart review Focused on rapport building Verbally administered PHQ-9 and GAD-7 for symptom monitoring Verbally administered Food & Mood questionnaire to assess various behaviors related to emotional eating Provided emphatic reflections and validation Collaborated with patient on a treatment goal  Psychoeducation provided regarding physical versus emotional hunger  Provisional DSM-5 Diagnosis(es): 307.59 (F50.8) Other Specified Feeding or Eating Disorder, Emotional Eating Behaviors  Plan: Aivy appears able and willing to participate as evidenced by collaboration on a treatment goal, engagement in reciprocal conversation, and asking questions as needed for clarification. The next appointment will be scheduled in approximately two weeks, which will be via MyChart Video Visit. The following treatment goal was established: increase coping skills. This provider will regularly review the treatment plan and medical chart to keep informed of status changes. Annaclaire expressed understanding and agreement with the initial treatment plan of care. Mellanie will be sent a handout via e-mail to utilize between now and the next appointment to increase awareness of hunger patterns and subsequent eating. Langley Gauss  provided verbal consent during today's appointment for this provider to send the handout via e-mail.

## 2020-05-12 NOTE — Progress Notes (Signed)
Chief Complaint:   OBESITY Kristie Martinez (MR# [id number]) is a 59 y.o. female who presents for evaluation and treatment of obesity and related comorbidities. Current BMI is There is no height or weight on file to calculate BMI. [name] has been struggling with her weight for many years and has been unsuccessful in either losing weight, maintaining weight loss, or reaching her healthy weight goal.  [name] is currently in the action stage of change and ready to dedicate time achieving and maintaining a healthier weight. [name] is interested in becoming our patient and working on intensive lifestyle modifications including (but not limited to) diet and exercise for weight loss.  [name]'s lowest weight after gastric bypass surgery was 183 pounds.  She was at 168 pounds in 2015 with a "yo-yo" diet.  [name]'s habits were reviewed today and are as follows: Her family eats meals together, she struggles with family and or coworkers weight loss sabotage, her desired weight loss is 21 pounds, she has been heavy most of her life, she started gaining weight when she started taking birth control at 59 years old, her heaviest weight ever was 263 pounds, she craves [place] and salty foods, she snacks frequently in the evenings, she wakes up frequently in the middle of the night to eat, she skips meals frequently, she is frequently drinking liquids with calories, she frequently makes poor food choices and she struggles with emotional eating.  Depression Screen [name]'s Food and Mood (modified PHQ-9) score was 16.  Depression screen [name] 2/9 05/12/2020  Decreased Interest 2  Down, Depressed, Hopeless 2  PHQ - 2 Score 4  Altered sleeping 2  Tired, decreased energy 2  Change in appetite 3  Feeling bad or failure about yourself  3  Trouble concentrating 2  Moving slowly or fidgety/restless 0  Suicidal thoughts 0  PHQ-9 Score 16  Difficult doing work/chores Somewhat difficult   Subjective:   1. Other  fatigue [name] denies daytime somnolence and admits to waking up still tired. Patent has a history of symptoms of morning fatigue. [name] generally gets 5-7 hours of sleep per night, and states that she has poor sleep quality. Snoring is not present. Apneic episodes are not present. Epworth Sleepiness Score is 3.  2. SOB (shortness of breath) on exertion [name] notes increasing shortness of breath with exercising and seems to be worsening over time with weight gain. She notes getting out of breath sooner with activity than she used to. This has gotten worse recently. Molleigh denies shortness of breath at rest or orthopnea.  3. Anemia, unspecified type [name] has a positive history of iron deficiency due to gastric bypass.  She endorses fatigue.  She is at risk for nutritional deficiencies.   CBC Latest Ref Rng & Units 02/05/2020 01/22/2020 01/09/2020  WBC 4.0 - 10.5 K/uL 6.3 6.9 6.9  Hemoglobin 12.0 - 15.0 g/dL 22.6 33.3 11.2(L)  Hematocrit 36 - 46 % 37.5 37.8 36.4  Platelets 150 - 400 K/uL 307.0 313.0 308   Lab Results  Component Value Date   IRON 240 (H) 02/05/2020   FERRITIN 13.2 02/05/2020   4. History of gastric bypass [name] had gastric bypass in 09/2003 with Dr. [name] at [facility].  5. Insomnia, unspecified type She is on trazodone, per PCP.  She drinks caffeine, Diet Mountain Dew, Diet Pepsi, "all day long" up until bedtime.  6. Family history of early CAD Recent calcium CT coronary score of 1 in 3/21. Results reviewed.  Also Echo from 3/21 essentially within normal limits.  7. Family history of diabetes mellitus in first degree relative Positive strong family history of diabetes mellitus and obesity.  She says she was never told she was insulin resistant or prediabetic in the past.  8. Hyperlipidemia, unspecified hyperlipidemia type Last checked in January with elevated LDL above goal.  Lab Results  Component Value Date   ALT 15 01/15/2020   AST 21 01/15/2020    ALKPHOS 113 01/15/2020   BILITOT 0.3 01/15/2020   Lab Results  Component Value Date   CHOL 194 12/17/2019   HDL 60.20 12/17/2019   LDLCALC 120 (H) 12/17/2019   TRIG 67.0 12/17/2019   CHOLHDL 3 12/17/2019   9. Other depression, with emotional eating See PHQ-9.  [name] has a lot of guilt.  10. At risk for heart disease [name] is at a higher than average risk for cardiovascular disease due to strong family history of early CAD.   Assessment/Plan:   1. Other fatigue [name] does feel that her weight is causing her energy to be lower than it should be. Fatigue may be related to obesity, depression or many other causes. Labs will be ordered, and in the meanwhile, [name] will focus on self care including making healthy food choices, increasing physical activity and focusing on stress reduction. - EKG 12-Lead - VITAMIN D 25 Hydroxy (Vit-D Deficiency, Fractures) - TSH - T4, free - T3  2. SOB (shortness of breath) on exertion [name] does feel that she gets out of breath more easily that she used to when she exercises. [name]'s shortness of breath appears to be obesity related and exercise induced. She has agreed to work on weight loss and gradually increase exercise to treat her exercise induced shortness of breath. Will continue to monitor closely.  3. Anemia, unspecified type Will check labs.  She should have a diet rich in iron and vitamin B12.  Will follow. - CBC with Differential/Platelet - Vitamin B12 - Folate  4. History of gastric bypass Will check labs today.  [name] will focus on a nutrient rich, prudent diet.  Will follow.   Counseling  You may need to eat 3 meals and 2 snacks, or 5 small meals each day in order to reach your protein and calorie goals.   Allow at least 15 minutes for each meal so that you can eat mindfully. Listen to your body so that you do not overeat. For most people, your sleeve or pouch will comfortably hold 4-6 ounces.  Eat foods from all food  groups. This includes fruits and vegetables, grains, dairy, and meat and other proteins.  Include a protein-rich food at every meal and snack, and eat the protein food first.   You should be taking a Bariatric Multivitamin as well as calcium.   5. Insomnia, unspecified type The problem of recurrent insomnia was discussed. Orders and follow up as documented in patient record. Counseling: Intensive lifestyle modifications are the first line treatment for this issue. We discussed several lifestyle modifications today and she will continue to work on diet, exercise and weight loss efforts.  She should have no caffeine after 5 pm.  Continue medication per PCP.  Counseling  Limit or avoid alcohol, caffeinated beverages, and cigarettes, especially close to bedtime.   Do not eat a large meal or eat spicy foods right before bedtime. This can lead to digestive discomfort that can make it hard for you to sleep.  Keep a sleep diary to help you and  your health care provider figure out what could be causing your insomnia.  . Make your bedroom a dark, comfortable place where it is easy to fall asleep. ? Put up shades or blackout curtains to block light from outside. ? Use a white noise machine to block noise. ? Keep the temperature cool. . Limit screen use before bedtime. This includes: ? Watching TV. ? Using your smartphone, tablet, or computer. . Stick to a routine that includes going to bed and waking up at the same times every day and night. This can help you fall asleep faster. Consider making a quiet activity, such as reading, part of your nighttime routine. . Try to avoid taking naps during the day so that you sleep better at night. . Get out of bed if you are still awake after 15 minutes of trying to sleep. Keep the lights down, but try reading or doing a quiet activity. When you feel sleepy, go back to bed.  6. Family history of early CAD Check labs today.  Prudent, nutrient rich diet and  weight loss.  7. Family history of diabetes mellitus in first degree relative We will check labs today.  Puja should focus on a prudent diet and weight loss. - Comprehensive metabolic panel - Hemoglobin A1c - Insulin, random  8. Hyperlipidemia, unspecified hyperlipidemia type Cardiovascular risk and specific lipid/LDL goals reviewed.  We discussed several lifestyle modifications today and Diannie will continue to work on diet, exercise and weight loss efforts. Orders and follow up as documented in patient record.  Will check labs today.  Work on prudent diet and weight loss.  Counseling Intensive lifestyle modifications are the first line treatment for this issue. . Dietary changes: Increase soluble fiber. Decrease simple carbohydrates. . Exercise changes: Moderate to vigorous-intensity aerobic activity 150 minutes per week if tolerated. . Lipid-lowering medications: see documented in medical record. - Lipid panel  9. Other depression, with emotional eating Patient was referred to Dr. Mallie Mussel, our Bariatric Psychologist, for evaluation due to her elevated PHQ-9 score and significant struggles with emotional eating.  10. At risk for heart disease Jameia was given approximately 15 minutes of coronary artery disease prevention counseling today. She is 59 y.o. female and has risk factors for heart disease including obesity. We discussed intensive lifestyle modifications today with an emphasis on specific weight loss instructions and strategies.   Repetitive spaced learning was employed today to elicit superior memory formation and behavioral change.  11. Class 1 obesity with serious comorbidity and body mass index (BMI) of 33.0 to 33.9 in adult, unspecified obesity type Adely is currently in the action stage of change and her goal is to continue with weight loss efforts. I recommend Jirah begin the structured treatment plan as follows:  She has agreed to the Category 2 Plan.  Exercise  goals: As is.   Behavioral modification strategies: increasing lean protein intake, meal planning and cooking strategies and planning for success.  No caffeine after 5:00 pm.  She should have 60 ounces of caffeine-free drink per day.  She was informed of the importance of frequent follow-up visits to maximize her success with intensive lifestyle modifications for her multiple health conditions. She was informed we would discuss her lab results at her next visit unless there is a critical issue that needs to be addressed sooner. Twanda agreed to keep her next visit at the agreed upon time to discuss these results.  Objective:   There were no vitals taken for this visit. There is  no height or weight on file to calculate BMI.  EKG: Normal sinus rhythm, rate 69 bpm.  Indirect Calorimeter completed today shows a VO2 of 308 and a REE of 2147.  Her calculated basal metabolic rate is 4585 thus her basal metabolic rate is better than expected.  General: Cooperative, alert, well developed, in no acute distress. HEENT: Conjunctivae and lids unremarkable. Cardiovascular: Regular rhythm.  Lungs: Normal work of breathing. Neurologic: No focal deficits.   Lab Results  Component Value Date   CREATININE 0.75 01/15/2020   BUN 11 01/15/2020   NA 139 01/15/2020   K 4.3 01/15/2020   CL 102 01/15/2020   CO2 23 01/15/2020   Lab Results  Component Value Date   ALT 15 01/15/2020   AST 21 01/15/2020   ALKPHOS 113 01/15/2020   BILITOT 0.3 01/15/2020   Lab Results  Component Value Date   HGBA1C 5.3 01/22/2020   Lab Results  Component Value Date   TSH 3.320 01/15/2020   Lab Results  Component Value Date   CHOL 194 12/17/2019   HDL 60.20 12/17/2019   LDLCALC 120 (H) 12/17/2019   TRIG 67.0 12/17/2019   CHOLHDL 3 12/17/2019   Lab Results  Component Value Date   WBC 6.3 02/05/2020   HGB 12.4 02/05/2020   HCT 37.5 02/05/2020   MCV 87.5 02/05/2020   PLT 307.0 02/05/2020   Lab Results    Component Value Date   IRON 240 (H) 02/05/2020   FERRITIN 13.2 02/05/2020   Attestation Statements:   Reviewed by clinician on day of visit: allergies, medications, problem list, medical history, surgical history, family history, social history, and previous encounter notes.  I, Insurance claims handler, CMA, am acting as Energy manager for [employer], DO.  I have reviewed the above documentation for accuracy and completeness, and I agree with the above. Thomasene Lot, DO

## 2020-05-13 LAB — COMPREHENSIVE METABOLIC PANEL
ALT: 20 IU/L (ref 0–32)
AST: 17 IU/L (ref 0–40)
Albumin/Globulin Ratio: 1.7 (ref 1.2–2.2)
Albumin: 4 g/dL (ref 3.8–4.9)
Alkaline Phosphatase: 105 IU/L (ref 48–121)
BUN/Creatinine Ratio: 16 (ref 9–23)
BUN: 11 mg/dL (ref 6–24)
Bilirubin Total: 0.4 mg/dL (ref 0.0–1.2)
CO2: 28 mmol/L (ref 20–29)
Calcium: 9.4 mg/dL (ref 8.7–10.2)
Chloride: 103 mmol/L (ref 96–106)
Creatinine, Ser: 0.7 mg/dL (ref 0.57–1.00)
GFR calc Af Amer: 111 mL/min/{1.73_m2} (ref >59)
GFR calc non Af Amer: 96 mL/min/{1.73_m2} (ref >59)
Globulin, Total: 2.4 g/dL (ref 1.5–4.5)
Glucose: 82 mg/dL (ref 65–99)
Potassium: 4.7 mmol/L (ref 3.5–5.2)
Sodium: 142 mmol/L (ref 134–144)
Total Protein: 6.4 g/dL (ref 6.0–8.5)

## 2020-05-13 LAB — LIPID PANEL
Chol/HDL Ratio: 2.9 ratio (ref 0.0–4.4)
Cholesterol, Total: 173 mg/dL (ref 100–199)
HDL: 59 mg/dL (ref >39)
LDL Chol Calc (NIH): 98 mg/dL (ref 0–99)
Triglycerides: 85 mg/dL (ref 0–149)
VLDL Cholesterol Cal: 16 mg/dL (ref 5–40)

## 2020-05-13 LAB — HEMOGLOBIN A1C
Est. average glucose Bld gHb Est-mCnc: 105 mg/dL
Hgb A1c MFr Bld: 5.3 % (ref 4.8–5.6)

## 2020-05-13 LAB — CBC WITH DIFFERENTIAL/PLATELET
Basophils Absolute: 0.1 10*3/uL (ref 0.0–0.2)
Basos: 1 %
EOS (ABSOLUTE): 0.5 10*3/uL — ABNORMAL HIGH (ref 0.0–0.4)
Eos: 6 %
Hematocrit: 36.8 % (ref 34.0–46.6)
Hemoglobin: 12.3 g/dL (ref 11.1–15.9)
Immature Grans (Abs): 0.2 10*3/uL — ABNORMAL HIGH (ref 0.0–0.1)
Immature Granulocytes: 3 %
Lymphocytes Absolute: 2.5 10*3/uL (ref 0.7–3.1)
Lymphs: 32 %
MCH: 30.4 pg (ref 26.6–33.0)
MCHC: 33.4 g/dL (ref 31.5–35.7)
MCV: 91 fL (ref 79–97)
Monocytes Absolute: 0.7 10*3/uL (ref 0.1–0.9)
Monocytes: 9 %
Neutrophils Absolute: 3.8 10*3/uL (ref 1.4–7.0)
Neutrophils: 49 %
Platelets: 345 10*3/uL (ref 150–450)
RBC: 4.04 x10E6/uL (ref 3.77–5.28)
RDW: 12.5 % (ref 11.7–15.4)
WBC: 7.8 10*3/uL (ref 3.4–10.8)

## 2020-05-13 LAB — VITAMIN B12: Vitamin B-12: >2000 pg/mL — ABNORMAL HIGH (ref 232–1245)

## 2020-05-13 LAB — T3: T3, Total: 137 ng/dL (ref 71–180)

## 2020-05-13 LAB — T4, FREE: Free T4: 1.29 ng/dL (ref 0.82–1.77)

## 2020-05-13 LAB — VITAMIN D 25 HYDROXY (VIT D DEFICIENCY, FRACTURES): Vit D, 25-Hydroxy: 116 ng/mL — ABNORMAL HIGH (ref 30.0–100.0)

## 2020-05-13 LAB — INSULIN, RANDOM: INSULIN: 9.5 u[IU]/mL (ref 2.6–24.9)

## 2020-05-13 LAB — FOLATE: Folate: 10.5 ng/mL (ref >3.0)

## 2020-05-13 LAB — TSH: TSH: 3.36 u[IU]/mL (ref 0.450–4.500)

## 2020-05-14 ENCOUNTER — Encounter (INDEPENDENT_AMBULATORY_CARE_PROVIDER_SITE_OTHER): Payer: Self-pay | Admitting: *Deleted

## 2020-05-19 ENCOUNTER — Telehealth (INDEPENDENT_AMBULATORY_CARE_PROVIDER_SITE_OTHER): Payer: 59 | Admitting: Psychology

## 2020-05-19 ENCOUNTER — Other Ambulatory Visit: Payer: Self-pay

## 2020-05-19 DIAGNOSIS — F5089 Other specified eating disorder: Secondary | ICD-10-CM

## 2020-05-20 NOTE — Progress Notes (Unsigned)
Office: [phone]  /  Fax: [phone]    Date: June 03, 2020   Appointment Start Time: *** Duration: *** minutes Provider: Lawerance Cruel, Psy.D. Type of Session: Individual Therapy  Location of Patient: {gbptloc:23249} Location of Provider: {Location of Service:22491} Type of Contact: Telepsychological Visit via MyChart Video Visit  Session Content: Kristie Martinez is a 59 y.o. female presenting via MyChart Video Visit for a follow-up appointment to address the previously established treatment goal of increasing coping skills. Today's appointment was a telepsychological visit due to COVID-19. Ruben provided verbal consent for today's telepsychological appointment and she is aware she is responsible for securing confidentiality on her end of the session. Prior to proceeding with today's appointment, [name]'s physical location at the time of this appointment was obtained as well a phone number she could be reached at in the event of technical difficulties. [name] and this provider participated in today's telepsychological service.   This provider conducted a brief check-in and verbally administered the PHQ-9 and GAD-7. *** [name] was receptive to today's appointment as evidenced by openness to sharing, responsiveness to feedback, and {gbreceptiveness:23401}.  Mental Status Examination:  Appearance: {Appearance:22431} Behavior: {Behavior:22445} Mood: {gbmood:21757} Affect: {Affect:22436} Speech: {Speech:22432} Eye Contact: {Eye Contact:22433} Psychomotor Activity: {Motor Activity:22434} Gait: {gbgait:23404} Thought Process: {thought process:22448}  Thought Content/Perception: {disturbances:22451} Orientation: {Orientation:22437} Memory/Concentration: {gbcognition:22449} Insight/Judgment: {Insight:22446}  Structured Assessments Results: The Patient Health Questionnaire-9 (PHQ-9) is a self-report measure that assesses symptoms and severity of depression over the course of the last two  weeks. [name] obtained a score of *** suggesting {GBPHQ9SEVERITY:21752}. [name] finds the endorsed symptoms to be {gbphq9difficulty:21754}. [0= Not at all; 1= Several days; 2= More than half the days; 3= Nearly every day] Little interest or pleasure in doing things ***  Feeling down, depressed, or hopeless ***  Trouble falling or staying asleep, or sleeping too much ***  Feeling tired or having little energy ***  Poor appetite or overeating ***  Feeling bad about yourself --- or that you are a failure or have let yourself or your family down ***  Trouble concentrating on things, such as reading the newspaper or watching television ***  Moving or speaking so slowly that other people could have noticed? Or the opposite --- being so fidgety or restless that you have been moving around a lot more than usual ***  Thoughts that you would be better off dead or hurting yourself in some way ***  PHQ-9 Score ***    The Generalized Anxiety Disorder-7 (GAD-7) is a brief self-report measure that assesses symptoms of anxiety over the course of the last two weeks. Roger obtained a score of *** suggesting {gbgad7severity:21753}. [name] finds the endorsed symptoms to be {gbphq9difficulty:21754}. [0= Not at all; 1= Several days; 2= Over half the days; 3= Nearly every day] Feeling nervous, anxious, on edge ***  Not being able to stop or control worrying ***  Worrying too much about different things ***  Trouble relaxing ***  Being so restless that it's hard to sit still ***  Becoming easily annoyed or irritable ***  Feeling afraid as if something awful might happen ***  GAD-7 Score ***   Interventions:  {Interventions for Progress Notes:23405}  DSM-5 Diagnosis(es): 307.59 (F50.8) Other Specified Feeding or Eating Disorder, Emotional Eating Behaviors  Treatment Goal & Progress: During the initial appointment with this provider, the following treatment goal was established: increase coping skills. [name]  has demonstrated progress in her goal as evidenced by {gbtxprogress:22839}. [name] also {gbtxprogress2:22951}.  Plan: The next appointment will  be scheduled in {gbweeks:21758}, which will be {gbtxmodality:23402}. The next session will focus on {Plan for Next Appointment:23400}.

## 2020-05-21 ENCOUNTER — Encounter: Payer: Self-pay | Admitting: Physical Therapy

## 2020-05-21 ENCOUNTER — Encounter (INDEPENDENT_AMBULATORY_CARE_PROVIDER_SITE_OTHER): Payer: Self-pay

## 2020-05-21 ENCOUNTER — Ambulatory Visit: Payer: 59 | Attending: Neurological Surgery | Admitting: Physical Therapy

## 2020-05-21 ENCOUNTER — Other Ambulatory Visit: Payer: Self-pay

## 2020-05-21 DIAGNOSIS — R29898 Other symptoms and signs involving the musculoskeletal system: Secondary | ICD-10-CM | POA: Diagnosis not present

## 2020-05-21 DIAGNOSIS — M62838 Other muscle spasm: Secondary | ICD-10-CM | POA: Diagnosis not present

## 2020-05-21 DIAGNOSIS — M542 Cervicalgia: Secondary | ICD-10-CM | POA: Insufficient documentation

## 2020-05-21 DIAGNOSIS — M6281 Muscle weakness (generalized): Secondary | ICD-10-CM | POA: Diagnosis not present

## 2020-05-21 DIAGNOSIS — R29818 Other symptoms and signs involving the nervous system: Secondary | ICD-10-CM | POA: Insufficient documentation

## 2020-05-21 NOTE — Therapy (Signed)
[facility] [address]  [address] [place], [place], [address] Phone: [phone]   Fax:  [phone]  Physical Therapy Treatment  Patient Details  Name: Kristie Martinez MRN: [id number] Date of Birth: July 07, 1961 Referring Provider (PT): Dr Barnett Abu   Encounter Date: 05/21/2020   PT End of Session - 05/21/20 1617    Visit Number 3    Number of Visits 5    Date for PT Re-Evaluation 06/03/20    Authorization Type [facility]    PT Start Time 1617    PT Stop Time 1705    PT Time Calculation (min) 48 min    Activity Tolerance Patient tolerated treatment well    Behavior During Therapy [facility] for tasks assessed/performed           Past Medical History:  Diagnosis Date  . Anemia   . Hamstring injury   . Hx of gastric bypass 2004  . Indigestion   . Obesity   . Post-operative nausea and vomiting   . Varicose veins     Past Surgical History:  Procedure Laterality Date  . CERVICAL DISC ARTHROPLASTY  2019  . ENDOMETRIAL ABLATION  2004  . ENDOVENOUS ABLATION SAPHENOUS VEIN W/ LASER  09-27-2012   left greater saphenous vein  by Gretta Began MD  . ENDOVENOUS ABLATION SAPHENOUS VEIN W/ LASER  11-08-2012   right greater saphenous vein   by Gretta Began MD  . mini gastric bypass   2004  . OTHER SURGICAL HISTORY  2004   mini gastric bypass  . TONSILLECTOMY    . TONSILLECTOMY AND ADENOIDECTOMY  1970    There were no vitals filed for this visit.   Subjective Assessment - 05/21/20 1620    Subjective Pt reports DN has helped tremendously - not having to get up as often while sitting and able to lay more on side. Notes arm still tends to go numb with deceleration in car or when bending over to pick something up.    Diagnostic tests MRI ACDF C5-7 looks good, osteophyte C4-5 with minor stenosis    Patient Stated Goals get rid of numbness and tingling and walk up a hill without increased symptoms in arm.    Currently in Pain? Yes    Pain  Score 2    2-3/10   Pain Location Neck   & upper shoulder/scapula   Pain Orientation Left    Pain Descriptors / Indicators Aching    Pain Type Acute pain    Pain Frequency Intermittent                             [facility] Adult PT Treatment/Exercise - 05/21/20 1617      Exercises   Exercises Neck      Neck Exercises: Machines for Strengthening   UBE (Upper Arm Bike) L1.5 x 6 min (3' fwd/3' back)      Neck Exercises: Theraband   Shoulder External Rotation 10 reps   yellow TB   Shoulder External Rotation Limitations hooklying with cues for scap retraction   pt informed she can try this h/l on pool noodle for HEP   Horizontal ABduction 10 reps   yellow TB   Horizontal ABduction Limitations hooklying with cues for scap retraction      Neck Exercises: Seated   Other Seated Exercise L brachial plexus nerve glide x 10      Manual Therapy  Manual Therapy Soft tissue mobilization;Myofascial release;Scapular mobilization;Joint mobilization    Manual therapy comments skilled palpation and monitoring during DN    Joint Mobilization L 1st & 2nd rib P/A and inf mobs - reproducing UE radiculopathy    Soft tissue mobilization STM/DTM to L cervical paraspinals, scalenes, UT, LS, rhomboids & subscapularis    Myofascial Release Manual TPR to L UT, rhomboids    Scapular Mobilization L scapula in s/l            Trigger Point Dry Needling - 05/21/20 1617    Consent Given? Yes    Education Handout Provided Previously provided    Muscles Treated Head and Neck Upper trapezius;Scalenes   Lt   Muscles Treated Upper Quadrant Rhomboids;Subscapularis   Lt   Upper Trapezius Response Twitch reponse elicited;Palpable increased muscle length    Scalenes Response Twitch reponse elicited;Palpable increased muscle length    Rhomboids Response Twitch response elicited;Palpable increased muscle length    Subscapularis Response Twitch response elicited;Palpable increased muscle length                 PT Education - 05/21/20 1701    Education Details HEP update - brachial plexus nerve glide/flossing; hooklying yellow TB horiz ABD & ER    Person(s) Educated Patient    Methods Explanation;Demonstration;Verbal cues;Tactile cues;Handout    Comprehension Verbalized understanding;Returned demonstration;Verbal cues required;Tactile cues required;Need further instruction               PT Long Term Goals - 05/21/20 1705      PT LONG TERM GOAL #1   Title Independent with ongoing HEP    Time 5    Period Weeks    Status On-going    Target Date 06/03/20      PT LONG TERM GOAL #2   Title improve cervical rotation =/> 70 degrees to ease looking for traffic    Time 5    Period Weeks    Status On-going    Target Date 06/03/20      PT LONG TERM GOAL #3   Title report =/> 75% reduction in numbness/tingling into Lt UE    Time 5    Period Weeks    Status On-going    Target Date 06/03/20      PT LONG TERM GOAL #4   Title report ability to sleep and bend forward with minimal to no cervical pain    Time 5    Period Weeks    Status On-going    Target Date 06/03/20      PT LONG TERM GOAL #5   Title improve FOTO =/> 45% limited    Time 5    Period Weeks    Status On-going    Target Date 06/03/20                 Plan - 05/21/20 1705    Clinical Impression Statement Shawnia reports "tremendous" benefit from DN - she is not having to get up as often while sitting and able to lay more on L side. Still having issues with UE radiculopathy when bending over to pick things up or with force of deceleration (forward pressure on head) while driving. Increased tension persisting in L UT along with scalenes and to a lesser degree medial scapular musculature - addressed with further manual therapy and DN with good palpable reduction in muscle tension but still reproducing UE radiculopathy with 1st and 2nd rib mobilization. Continued thoracic and cervical stabilization exercise  progression  and introduced UE nerve glides to promote reduction in UE radiculopathy and will plan to focus on further posterior cervical strengthening in upcoming visit.    Personal Factors and Comorbidities Comorbidity 3+    Comorbidities h/o C5-7 ACDF    Examination-Activity Limitations Bend;Other;Reach Overhead    Examination-Participation Restrictions Community Activity;Other    Stability/Clinical Decision Making Stable/Uncomplicated    Rehab Potential Good    PT Frequency 1x / week    PT Duration --   5 wks as she will be out of town next wk   PT Treatment/Interventions Taping;Patient/family education;Moist Heat;Ultrasound;Passive range of motion;Therapeutic exercise;Cryotherapy;Electrical Stimulation;Manual techniques;Dry needling;ADLs/Self Care Home Management;Therapeutic activities    PT Next Visit Plan cervical stabilization - retraction/extension, thoracic mobilization, manual work PRN    Consulted and Agree with Plan of Care Patient           Patient will benefit from skilled therapeutic intervention in order to improve the following deficits and impairments:  Decreased range of motion, Impaired UE functional use, Increased muscle spasms, Pain, Postural dysfunction, Hypomobility  Visit Diagnosis: Cervicalgia  Other muscle spasm     Problem List Patient Active Problem List   Diagnosis Date Noted  . Obesity (BMI 30-39.9) 01/22/2020  . Hypokalemia   . Paresthesia of left arm   . Class 1 obesity due to excess calories without serious comorbidity with body mass index (BMI) of 32.0 to 32.9 in adult   . Pure hypercholesterolemia   . Family history of early CAD   . Hot flashes 05/21/2018  . Atypical chest pain 11/17/2016  . Status post gastric bypass for obesity 03/07/2013  . Varicose veins of lower extremities with other complications 05/17/2012    Marry Guan, PT, MPT 05/21/2020, 7:56 PM  [facility] [address]  [address] [place], [place], [address] Phone: [phone]   Fax:  [phone]  Name: JEWEL MCAFEE MRN: [id number] Date of Birth: [birth year]

## 2020-05-21 NOTE — Patient Instructions (Signed)
    Home exercise program created by Naseem Adler, PT.  For questions, please contact Sai Moura via phone at [phone] or email at [email or web address]  [facility] [place] [address]  [address] [place], [place], [address] Phone: [phone]   Fax:  [phone]    

## 2020-05-26 ENCOUNTER — Ambulatory Visit (INDEPENDENT_AMBULATORY_CARE_PROVIDER_SITE_OTHER): Payer: 59 | Admitting: Family Medicine

## 2020-05-26 ENCOUNTER — Other Ambulatory Visit: Payer: Self-pay

## 2020-05-26 ENCOUNTER — Encounter (INDEPENDENT_AMBULATORY_CARE_PROVIDER_SITE_OTHER): Payer: Self-pay | Admitting: Family Medicine

## 2020-05-26 VITALS — BP 120/80 | HR 82 | Temp 97.8°F | Ht 66.0 in | Wt 204.0 lb

## 2020-05-26 DIAGNOSIS — G4709 Other insomnia: Secondary | ICD-10-CM | POA: Diagnosis not present

## 2020-05-26 DIAGNOSIS — Z862 Personal history of diseases of the blood and blood-forming organs and certain disorders involving the immune mechanism: Secondary | ICD-10-CM

## 2020-05-26 DIAGNOSIS — Z6833 Body mass index (BMI) 33.0-33.9, adult: Secondary | ICD-10-CM

## 2020-05-26 DIAGNOSIS — E669 Obesity, unspecified: Secondary | ICD-10-CM | POA: Diagnosis not present

## 2020-05-26 DIAGNOSIS — E8881 Metabolic syndrome: Secondary | ICD-10-CM | POA: Diagnosis not present

## 2020-05-26 DIAGNOSIS — E538 Deficiency of other specified B group vitamins: Secondary | ICD-10-CM

## 2020-05-26 DIAGNOSIS — E559 Vitamin D deficiency, unspecified: Secondary | ICD-10-CM | POA: Diagnosis not present

## 2020-05-26 DIAGNOSIS — E88819 Insulin resistance, unspecified: Secondary | ICD-10-CM

## 2020-05-27 ENCOUNTER — Other Ambulatory Visit: Payer: Self-pay | Admitting: Family Medicine

## 2020-05-27 MED ORDER — PHENTERMINE HCL 30 MG PO CAPS: 30.0000 mg | ORAL_CAPSULE | ORAL | 0 refills | Status: DC

## 2020-05-27 MED FILL — PHENTERMINE 30 MG CAPSULE: 30 | 30 days supply | Qty: 30 | Fill #0

## 2020-05-27 NOTE — Progress Notes (Signed)
Chief Complaint:   OBESITY Kristie Martinez is here to discuss her progress with her obesity treatment plan along with follow-up of her obesity related diagnoses. Kristie Martinez is on the Category 2 Plan and states she is following her eating plan approximately 70% of the time. Kristie Martinez states she is walking for 20-25 minutes 7 times per week.  Today's visit was #: 2 Starting weight: Starting date: 05/12/2020 Today's weight: 204 lbs Today's date: 05/26/2020 Total lbs lost to date:  Total lbs lost since last in-office visit:   Interim History: Kristie Martinez says she did very well during her fist week.  She went camping over the weekend.  She planned easy to follow meals and found all he foods she needs.  She says her hunger is well-controlled.  Cravings are also well-controlled.  She is cutting out caffeine after 5 pm and slept better.  She is still doing PT for neck pain and is having dry needling.  She thinks that at her last office visit she weighed in at 206.4 pounds.  She has lost 2 pounds since her last office visit.  She also enjoyed seeing Dr. Dewaine Conger and found it helpful.  Subjective:   1. Vitamin D deficiency Kristie Martinez's Vitamin D level was 116.0 on 05/12/2020. She is currently taking OTC vitamin D 5000 IU each day. She denies nausea, vomiting or muscle weakness.  2. B12 deficiency, s/Kristie Martinez mini gastric bypass She is not a vegetarian.  She does not have a previous diagnosis of pernicious anemia.  She has a history of weight loss surgery.   Lab Results  Component Value Date   VITAMINB12 >2000 (H) 05/12/2020   3. Insulin resistance [name] has a diagnosis of insulin resistance based on her elevated fasting insulin level >5. She continues to work on diet and exercise to decrease her risk of diabetes.  Lab Results  Component Value Date   INSULIN 9.5 05/12/2020   Lab Results  Component Value Date   HGBA1C 5.3 05/12/2020   4. H/O iron deficiency anemia Kristie Martinez is not a vegetarian.  She has a history of  weight loss surgery.  Labs are stable.    CBC Latest Ref Rng & Units 05/12/2020 02/05/2020 01/22/2020  WBC 3.4 - 10.8 x10E3/uL 7.8 6.3 6.9  Hemoglobin 11.1 - 15.9 g/dL 62.0 35.5 97.4  Hematocrit 34.0 - 46.6 % 36.8 37.5 37.8  Platelets 150 - 450 x10E3/uL 345 307.0 313.0   Lab Results  Component Value Date   IRON 240 (H) 02/05/2020   FERRITIN 13.2 02/05/2020   Lab Results  Component Value Date   VITAMINB12 >2000 (H) 05/12/2020   5. Other insomnia Kristie Martinez says she did not need to take any sleep medication last week due to change in her diet.  She is feeling better/more rested.  Assessment/Plan:   1. Vitamin D deficiency Low Vitamin D level contributes to fatigue and are associated with obesity, breast, and colon cancer.  Will recheck vitamin D level in 3 months.  She agrees to continue to take prescription Vitamin D @50 ,000 IU every week and will follow-up for routine testing of Vitamin D, at least 2-3 times per year to avoid over-replacement.  2. B12 deficiency, s/Kristie Martinez mini gastric bypass Kristie Martinez will decrease vitamin B12 supplement to half the amount she was taking before.  Will recheck in 3 months.  Counseling . The body needs vitamin B12: to make red blood cells; to make DNA; and to help the nerves work properly so they can carry messages from  the brain to the body.  . The main causes of vitamin B12 deficiency include dietary deficiency, digestive diseases, pernicious anemia, and having a surgery in which part of the stomach or small intestine is removed.  . Certain medicines can make it harder for the body to absorb vitamin B12. These medicines include: heartburn medications; some antibiotics; some medications used to treat diabetes, gout, and high cholesterol.  . In some cases, there are no symptoms of this condition. If the condition leads to anemia or nerve damage, various symptoms can occur, such as weakness or fatigue, shortness of breath, and numbness or tingling in your hands and  feet.   . Treatment:  o May include taking vitamin B12 supplements.  o Avoid alcohol.  o Eat lots of healthy foods that contain vitamin B12: - Beef, pork, chicken, [place], and organ meats, such as liver.  - Seafood: This includes clams, rainbow trout, salmon, tuna, and haddock. Eggs.  - Cereal and dairy products that are fortified: This means that vitamin B12 has been added to the food.   3. Insulin resistance Kristie Martinez will continue to work on weight loss, exercise, and decreasing simple carbohydrates to help decrease the risk of diabetes. Kristie Martinez agreed to follow-up with [place] as directed to closely monitor her progress.  4. H/O iron deficiency anemia Continue current OTC supplement.  Will continue to monitor labs.  Orders and follow up as documented in patient record.  Counseling . Iron is essential for our bodies to make red blood cells.  Reasons that someone may be deficient include: an iron-deficient diet (more likely in those following vegan or vegetarian diets), women with heavy menses, patients with GI disorders or poor absorption, patients that have had bariatric surgery, frequent blood donors, patients with cancer, and patients with heart disease.   Kristie Martinez foods include dark leafy greens, red and white meats, eggs, seafood, and beans.   . Certain foods and drinks prevent your body from absorbing iron properly. Avoid eating these foods in the same meal as iron-rich foods or with iron supplements. These foods include: coffee, black tea, and red wine; milk, dairy products, and foods that are high in calcium; beans and soybeans; whole grains.  . Constipation can be a side effect of iron supplementation. Increased water and fiber intake are helpful. Water goal: > 2 liters/day. Fiber goal: > 25 grams/day.  5. Other insomnia The problem of recurrent insomnia was discussed. Orders and follow up as documented in patient record. Counseling: Intensive lifestyle modifications are the first line  treatment for this issue. We discussed several lifestyle modifications today and she will continue to work on diet, exercise and weight loss efforts.   Counseling  Limit or avoid alcohol, caffeinated beverages, and cigarettes, especially close to bedtime.   Do not eat a large meal or eat spicy foods right before bedtime. This can lead to digestive discomfort that can make it hard for you to sleep.  Keep a sleep diary to help you and your health care provider figure out what could be causing your insomnia.  . Make your bedroom a dark, comfortable place where it is easy to fall asleep. ? Put up shades or blackout curtains to block light from outside. ? Use a white noise machine to block noise. ? Keep the temperature cool. . Limit screen use before bedtime. This includes: ? Watching TV. ? Using your smartphone, tablet, or computer. . Stick to a routine that includes going to bed and waking up at the  same times every day and night. This can help you fall asleep faster. Consider making a quiet activity, such as reading, part of your nighttime routine. . Try to avoid taking naps during the day so that you sleep better at night. . Get out of bed if you are still awake after 15 minutes of trying to sleep. Keep the lights down, but try reading or doing a quiet activity. When you feel sleepy, go back to bed.  6. Class 1 obesity with serious comorbidity and body mass index (BMI) of 33.0 to 33.9 in adult, unspecified obesity type Kristie Martinez is currently in the action stage of change. As such, her goal is to continue with weight loss efforts. She has agreed to the Category 2 Plan with the goal of getting in all foods in a day.  Exercise goals: As is.  Behavioral modification strategies: increasing lean protein intake, increasing water intake and no skipping meals.  Kristie Martinez has agreed to follow-up with our clinic in 2 weeks. She was informed of the importance of frequent follow-up visits to maximize her  success with intensive lifestyle modifications for her multiple health conditions.   Objective:   Blood pressure 120/80, pulse 82, temperature 97.8 F (36.6 C), temperature source Oral, height 5\' 6"  (1.676 m), weight 204 lb (92.5 kg), SpO2 98 %. Body mass index is 32.93 kg/m.  General: Cooperative, alert, well developed, in no acute distress. HEENT: Conjunctivae and lids unremarkable. Cardiovascular: Regular rhythm.  Lungs: Normal work of breathing. Neurologic: No focal deficits.   Lab Results  Component Value Date   CREATININE 0.70 05/12/2020   BUN 11 05/12/2020   NA 142 05/12/2020   K 4.7 05/12/2020   CL 103 05/12/2020   CO2 28 05/12/2020   Lab Results  Component Value Date   ALT 20 05/12/2020   AST 17 05/12/2020   ALKPHOS 105 05/12/2020   BILITOT 0.4 05/12/2020   Lab Results  Component Value Date   HGBA1C 5.3 05/12/2020   HGBA1C 5.3 01/22/2020   Lab Results  Component Value Date   INSULIN 9.5 05/12/2020   Lab Results  Component Value Date   TSH 3.360 05/12/2020   Lab Results  Component Value Date   CHOL 173 05/12/2020   HDL 59 05/12/2020   LDLCALC 98 05/12/2020   TRIG 85 05/12/2020   CHOLHDL 2.9 05/12/2020   Lab Results  Component Value Date   WBC 7.8 05/12/2020   HGB 12.3 05/12/2020   HCT 36.8 05/12/2020   MCV 91 05/12/2020   PLT 345 05/12/2020   Lab Results  Component Value Date   IRON 240 (H) 02/05/2020   FERRITIN 13.2 02/05/2020   Attestation Statements:   Reviewed by clinician on day of visit: allergies, medications, problem list, medical history, surgical history, family history, social history, and previous encounter notes.  Time spent on visit including pre-visit chart review and post-visit care and charting was 33 minutes.   I, 02/07/2020, CMA, am acting as Insurance claims handler for Energy manager, DO.  I have reviewed the above documentation for accuracy and completeness, and I agree with the above. [employer], DO

## 2020-05-28 ENCOUNTER — Encounter: Payer: Self-pay | Admitting: Physical Therapy

## 2020-05-28 ENCOUNTER — Other Ambulatory Visit: Payer: Self-pay

## 2020-05-28 ENCOUNTER — Ambulatory Visit: Payer: 59 | Admitting: Physical Therapy

## 2020-05-28 DIAGNOSIS — R29818 Other symptoms and signs involving the nervous system: Secondary | ICD-10-CM | POA: Diagnosis not present

## 2020-05-28 DIAGNOSIS — R29898 Other symptoms and signs involving the musculoskeletal system: Secondary | ICD-10-CM | POA: Diagnosis not present

## 2020-05-28 DIAGNOSIS — M542 Cervicalgia: Secondary | ICD-10-CM | POA: Diagnosis not present

## 2020-05-28 DIAGNOSIS — M62838 Other muscle spasm: Secondary | ICD-10-CM

## 2020-05-28 DIAGNOSIS — M6281 Muscle weakness (generalized): Secondary | ICD-10-CM | POA: Diagnosis not present

## 2020-05-28 NOTE — Therapy (Signed)
[facility] [address]  [address] [place], [place], [address] Phone: [phone]   Fax:  [phone]  Physical Therapy Treatment  Patient Details  Name: Kristie Martinez MRN: [id number] Date of Birth: [birth year] Referring Provider (PT): Dr Barnett Abu   Encounter Date: 05/28/2020   PT End of Session - 05/28/20 1615    Visit Number 4    Number of Visits 5    Date for PT Re-Evaluation 06/03/20    Authorization Type [facility]    PT Start Time 1615    PT Stop Time 1715    PT Time Calculation (min) 60 min    Activity Tolerance Patient tolerated treatment well    Behavior During Therapy [facility] for tasks assessed/performed           Past Medical History:  Diagnosis Date  . Anemia   . Hamstring injury   . Hx of gastric bypass 2004  . Indigestion   . Obesity   . Post-operative nausea and vomiting   . Varicose veins     Past Surgical History:  Procedure Laterality Date  . CERVICAL DISC ARTHROPLASTY  2019  . ENDOMETRIAL ABLATION  2004  . ENDOVENOUS ABLATION SAPHENOUS VEIN W/ LASER  09-27-2012   left greater saphenous vein  by Gretta Began MD  . ENDOVENOUS ABLATION SAPHENOUS VEIN W/ LASER  11-08-2012   right greater saphenous vein   by Gretta Began MD  . mini gastric bypass   2004  . OTHER SURGICAL HISTORY  2004   mini gastric bypass  . TONSILLECTOMY    . TONSILLECTOMY AND ADENOIDECTOMY  1970    There were no vitals filed for this visit.   Subjective Assessment - 05/28/20 1618    Subjective Pt reports she went kayaking over the weekend and did well. Still having issues with hill ascent/descent but less of an issue with deceleration when stopping car.    Diagnostic tests MRI ACDF C5-7 looks good, osteophyte C4-5 with minor stenosis    Patient Stated Goals get rid of numbness and tingling and walk up a hill without increased symptoms in arm.    Currently in Pain? Yes    Pain Score 2    2-3/10   Pain Location Neck    Pain  Orientation Left    Pain Descriptors / Indicators Aching    Pain Type Acute pain    Pain Frequency Intermittent                             [facility] Adult PT Treatment/Exercise - 05/28/20 1615      Exercises   Exercises Neck      Neck Exercises: Machines for Strengthening   Nustep L6 x 6' (UE/LE)      Neck Exercises: Theraband   Shoulder External Rotation 10 reps   yellow TB   Shoulder External Rotation Limitations standing with back against doorframe to promote scap retraction    Horizontal ABduction 10 reps   yellow TB   Horizontal ABduction Limitations standing with back against doorframe to promote scap retraction      Neck Exercises: Prone   Axial Exension 10 reps    Axial Extension Limitations POE    Neck Retraction 10 reps;5 secs    Neck Retraction Limitations POE    Other Prone Exercise POE retraction + rotation x 5      Modalities   Modalities Electrical Stimulation;Cryotherapy  Cryotherapy   Number Minutes Cryotherapy 10 Minutes    Cryotherapy Location Cervical    Type of Cryotherapy Ice pack      Electrical Stimulation   Electrical Stimulation Location B UT/LS    Electrical Stimulation Action IFC    Electrical Stimulation Parameters 80-150 Hz, intensity to pt tol x 10'    Electrical Stimulation Goals Pain;Tone      Manual Therapy   Other Manual Therapy Provided instruction in use Theracane for UT & LS STM      Neck Exercises: Stretches   Levator Stretch Left;30 seconds;3 reps    Other Neck Stretches L 1st rib mobilization + cervical side bending & rotation 2 x 30 sec each                  PT Education - 05/28/20 1701    Education Details HEP update - LS stretch, 1st rib mobs + cerivcal side bend and rotation, POE cervical retraction, Theracane for self-STM; info on home TENS unit options    Person(s) Educated Patient    Methods Explanation;Demonstration;Handout    Comprehension Verbalized understanding;Returned  demonstration;Need further instruction               PT Long Term Goals - 05/21/20 1705      PT LONG TERM GOAL #1   Title Independent with ongoing HEP    Time 5    Period Weeks    Status On-going    Target Date 06/03/20      PT LONG TERM GOAL #2   Title improve cervical rotation =/> 70 degrees to ease looking for traffic    Time 5    Period Weeks    Status On-going    Target Date 06/03/20      PT LONG TERM GOAL #3   Title report =/> 75% reduction in numbness/tingling into Lt UE    Time 5    Period Weeks    Status On-going    Target Date 06/03/20      PT LONG TERM GOAL #4   Title report ability to sleep and bend forward with minimal to no cervical pain    Time 5    Period Weeks    Status On-going    Target Date 06/03/20      PT LONG TERM GOAL #5   Title improve FOTO =/> 45% limited    Time 5    Period Weeks    Status On-going    Target Date 06/03/20                 Plan - 05/28/20 1622    Clinical Impression Statement Emine reports she was able to kayak w/o significant increase in pain or radiculopathy. She notes UE nerve glides have been helpful when radiculopathy flares up but has noted less deceleration related triggers although still having issues with inclines. Progressed cervical strengthening to increase gravity resistance with good tolerance except for attempts at retraction + cervical diagonals which reproduced L UE radiculopathy. Pt localizing most of her tightness to L LS today, therefore provided instruction in stretches as well as use of Theracane for self-STM. Also introduced 1st rib mobilizations with cervical side bending and rotation to work on reducing triggers for UE radiculopathy. Session concluded with trial of estim to further reduce pain and muscle tension with pt noting benefit. With her consent, will plan to submit her information to look into insurance coverage for home Flex-IT unit.    Personal Factors and  Comorbidities Comorbidity  3+    Comorbidities h/o C5-7 ACDF    Examination-Activity Limitations Bend;Other;Reach Overhead    Examination-Participation Restrictions Community Activity;Other    Stability/Clinical Decision Making Stable/Uncomplicated    Rehab Potential Good    PT Frequency 1x / week    PT Duration --   5 wks as she will be out of town next wk   PT Treatment/Interventions Taping;Patient/family education;Moist Heat;Ultrasound;Passive range of motion;Therapeutic exercise;Cryotherapy;Electrical Stimulation;Manual techniques;Dry needling;ADLs/Self Care Home Management;Therapeutic activities    PT Next Visit Plan Recert vs transition to HEP    Consulted and Agree with Plan of Care Patient           Patient will benefit from skilled therapeutic intervention in order to improve the following deficits and impairments:  Decreased range of motion, Impaired UE functional use, Increased muscle spasms, Pain, Postural dysfunction, Hypomobility  Visit Diagnosis: Cervicalgia  Other muscle spasm     Problem List Patient Active Problem List   Diagnosis Date Noted  . Obesity (BMI 30-39.9) 01/22/2020  . Hypokalemia   . Paresthesia of left arm   . Class 1 obesity due to excess calories without serious comorbidity with body mass index (BMI) of 32.0 to 32.9 in adult   . Pure hypercholesterolemia   . Family history of early CAD   . Hot flashes 05/21/2018  . Atypical chest pain 11/17/2016  . Status post gastric bypass for obesity 03/07/2013  . Varicose veins of lower extremities with other complications 05/17/2012    Marry Guan, PT, MPT 05/28/2020, 6:22 PM  [facility] [address]  [address] [place], [place], [address] Phone: [phone]   Fax:  [phone]  Name: CONNY SITU MRN: [id number] Date of Birth: 1961-10-13

## 2020-05-28 NOTE — Patient Instructions (Addendum)
Home exercise program created by Glenetta Hew, PT.  For questions, please contact Ruchama Kubicek via phone at [phone] or email at [email or web address]  [facility] [address]  [address] [place], [place], [address] Phone: [phone]   Fax:  [phone]     TENS UNIT  This is helpful for muscle pain and spasm.   Search and Purchase a TENS 7000 2nd edition at www.tenspros.com or www.amazon.com  (It should be less than $30)     TENS unit instructions:   Do not shower or bathe with the unit on  Turn the unit off before removing electrodes or batteries  If the electrodes lose stickiness add a drop of water to the electrodes after they are disconnected from the unit and place on plastic sheet. If you continued to have difficulty, call the TENS unit company to purchase more electrodes.  Do not apply lotion on the skin area prior to use. Make sure the skin is clean and dry as this will help prolong the life of the electrodes.  After use, always check skin for unusual red areas, rash or other skin difficulties. If there are any skin problems, does not apply electrodes to the same area.  Never remove the electrodes from the unit by pulling the wires.  Do not use the TENS unit or electrodes other than as directed.  Do not change electrode placement without consulting your therapist or physician.  Keep 2 fingers with between each electrode.   TENS stands for Transcutaneous Electrical Nerve Stimulation. In other words, electrical impulses are allowed to pass through the skin in order to excite a nerve.   Purpose and Use of TENS:  TENS is a method used to manage acute and chronic pain without the use of drugs. It has been effective in managing pain associated with surgery, sprains, strains, trauma, rheumatoid arthritis, and neuralgias. It is a non-addictive, low risk, and non-invasive technique used to control pain. It  is not, by any means, a curative form of treatment.   How TENS Works:  Most TENS units are a Statistician unit powered by one 9 volt battery. Attached to the outside of the unit are two lead wires where two pins and/or snaps connect on each wire. All units come with a set of four reusable pads or electrodes. These are placed on the skin surrounding the area involved. By inserting the leads into  the pads, the electricity can pass from the unit making the circuit complete.  As the intensity is turned up slowly, the electrical current enters the body from the electrodes through the skin to the surrounding nerve fibers. This triggers the release of hormones from within the body. These hormones contain pain relievers. By increasing the circulation of these hormones, the person's pain may be lessened. It is also believed that the electrical stimulation itself helps to block the pain messages being sent to the brain, thus also decreasing the body's perception of pain.   Hazards:  TENS units are NOT to be used by patients with PACEMAKERS, DEFIBRILLATORS, DIABETIC PUMPS, PREGNANT WOMEN, and patients with SEIZURE DISORDERS.  TENS units are NOT to be used over the heart, throat, brain, or spinal cord.  One of the major side effects from the TENS unit may be skin irritation. Some people may develop a rash if they are sensitive to the materials used in the electrodes or the connecting wires.   Wear the unit for up to  30-45 minutes, 3-4x/day as needed..   Avoid overuse due the body getting used to the stem making it not as effective over time.

## 2020-06-03 ENCOUNTER — Telehealth (INDEPENDENT_AMBULATORY_CARE_PROVIDER_SITE_OTHER): Payer: 59 | Admitting: Psychology

## 2020-06-05 ENCOUNTER — Encounter: Payer: Self-pay | Admitting: Physical Therapy

## 2020-06-05 ENCOUNTER — Ambulatory Visit: Payer: 59 | Admitting: Physical Therapy

## 2020-06-05 ENCOUNTER — Other Ambulatory Visit: Payer: Self-pay

## 2020-06-05 DIAGNOSIS — M542 Cervicalgia: Secondary | ICD-10-CM | POA: Diagnosis not present

## 2020-06-05 DIAGNOSIS — M62838 Other muscle spasm: Secondary | ICD-10-CM

## 2020-06-05 DIAGNOSIS — R29898 Other symptoms and signs involving the musculoskeletal system: Secondary | ICD-10-CM | POA: Diagnosis not present

## 2020-06-05 DIAGNOSIS — M6281 Muscle weakness (generalized): Secondary | ICD-10-CM | POA: Diagnosis not present

## 2020-06-05 DIAGNOSIS — R29818 Other symptoms and signs involving the nervous system: Secondary | ICD-10-CM

## 2020-06-05 NOTE — Therapy (Signed)
[facility] [address]  [place], [place], [address] Phone: [phone]   Fax:  [phone]  Physical Therapy Treatment / Recert  Patient Details  Name: Kristie Martinez MRN: [id number] Date of Birth: 20-Jan-1961 Referring Provider (PT): Dr Kristie Martinez   Encounter Date: 06/05/2020   PT End of Session - 06/05/20 0850    Visit Number 5    Number of Visits 11    Date for PT Re-Evaluation 07/24/20    Authorization Type [facility]    PT Start Time 0850    PT Stop Time 0934    PT Time Calculation (min) 44 min    Activity Tolerance Patient tolerated treatment well    Behavior During Therapy [facility] for tasks assessed/performed           Past Medical History:  Diagnosis Date  . Anemia   . Hamstring injury   . Hx of gastric bypass 2004  . Indigestion   . Obesity   . Post-operative nausea and vomiting   . Varicose veins     Past Surgical History:  Procedure Laterality Date  . CERVICAL DISC ARTHROPLASTY  2019  . ENDOMETRIAL ABLATION  2004  . ENDOVENOUS ABLATION SAPHENOUS VEIN W/ LASER  09-27-2012   left greater saphenous vein  by Kristie Jews MD  . ENDOVENOUS ABLATION SAPHENOUS VEIN W/ LASER  11-08-2012   Martinez greater saphenous vein   by Kristie Jews MD  . mini gastric bypass   2004  . OTHER SURGICAL HISTORY  2004   mini gastric bypass  . TONSILLECTOMY    . TONSILLECTOMY AND ADENOIDECTOMY  1970    There were no vitals filed for this visit.   Subjective Assessment - 06/05/20 0851    Subjective Pt reports she typically gets 3-4 days of relief from PT and is very happy that pain is not constant anymore. States she spoke with Dr. Ellene Martinez about extending PT as she continues to have intermittent numbness in UEs.    Diagnostic tests MRI ACDF C5-7 looks good, osteophyte C4-5 with minor stenosis    Patient Stated Goals get rid of numbness and tingling and walk up a hill without increased symptoms in arm.    Currently in Pain?  Yes    Pain Score 2     Pain Location Neck    Pain Orientation Left    Pain Descriptors / Indicators Constant;Aching;Numbness    Pain Type Acute pain    Pain Radiating Towards pain into upper medial scapula; intermittent numbness down L arm to 1st 3 digits    Pain Frequency Constant    Aggravating Factors  leaning fwd, cervical extension, walking inclines              [facility] PT Assessment - 06/05/20 0850      Assessment   Medical Diagnosis Lt cervical radiculopathy    Referring Provider (PT) Dr Kristie Martinez    Onset Date/Surgical Date --   third week in March   Kristie Martinez    Next MD Visit TBD      Prior Function   Level of Independence Independent    Vocation Full time employment    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    Leisure beach swimming, walking, travel, reading       Observation/Other Assessments   Focus on Therapeutic Outcomes (FOTO)  Neck - 52% (48% limited)      AROM   Cervical Flexion  32    Cervical Extension 31   triggers L UE numbness   Cervical - Martinez Side Bend 22    Cervical - Left Side Bend 14   L UE numbness   Cervical - Martinez Rotation 62    Cervical - Left Rotation 51   L UE numbness     Strength   Martinez Shoulder Flexion 4+/5    Martinez Shoulder ABduction 4+/5    Martinez Shoulder Internal Rotation 4+/5    Martinez Shoulder External Rotation 4/5    Left Shoulder Flexion 4+/5    Left Shoulder ABduction 4+/5   pain   Left Shoulder Internal Rotation 4+/5    Left Shoulder External Rotation 4/5    Martinez Hand Grip (lbs) 21.67   25, 20, 20   Martinez Hand Lateral Pinch 7 lbs   7, 8, 6   Martinez Hand 3 Point Pinch 6 lbs   7, 6, 5   Left Hand Grip (lbs) 5   5, 7, 3   Left Hand Lateral Pinch 4.67 lbs   5, 4, 4   Left Hand 3 Point Pinch 4 lbs   3, 5, 4                        [facility] Adult PT Treatment/Exercise - 06/05/20 0850      Exercises   Exercises Neck      Neck Exercises: Machines for Strengthening   UBE (Upper  Arm Bike) L2.5 x 6 min (3' fwd/3' back)                       PT Long Term Goals - 06/05/20 0906      PT LONG TERM GOAL #1   Title Independent with ongoing HEP    Status Partially Met   06/05/20 - met for current HEP   Target Date 07/24/20      PT LONG TERM GOAL #2   Title improve cervical rotation =/> 70 degrees to ease looking for traffic    Status On-going    Target Date 07/24/20      PT LONG TERM GOAL #3   Title report =/> 75% reduction in numbness/tingling into Lt UE    Status On-going   06/05/20 - 50% reduction in L UE numbess/tingling   Target Date 07/24/20      PT LONG TERM GOAL #4   Title report ability to sleep and bend forward with minimal to no cervical pain    Status Partially Met   7//16/21 - not limited due to pain, but notes ongoing L UE numbness with certain sleep positions (laying on L side) or fwd lean   Target Date 07/24/20      PT LONG TERM GOAL #5   Title improve FOTO =/> 45% limited    Status On-going   06/05/20 - FOTO: 48% limited   Target Date 07/24/20                 Plan - 06/05/20 0934    Clinical Impression Statement Kristie Martinez reports neck pain significantly decreased along with 50% reduction in numbness and tingling since start of PT but continues to experience onset of numbness with forward lean (bending over to pick things up off floor or walking inclines), cervical extension, and end ROM L cervical rotation or side bending. Cervical ROM mildly improved with some baseline limitation in motion likely due to prior C5-7 ACDF. B shoulder strength now  essentially symmetrical although L grip strength notably weaker than R, more than would be expected due to R hand dominance. Majority of goals still ongoing to partially met. Kristie Martinez expressing interest in continuing with PT, therefore will recommend recert for additional 1x/wk for up to 6 visits (7 weeks due to unavailable 1 week).    Personal Factors and Comorbidities Comorbidity 3+     Comorbidities h/o C5-7 ACDF    Examination-Activity Limitations Bend;Other;Reach Overhead    Examination-Participation Restrictions Community Activity;Other    Stability/Clinical Decision Making Stable/Uncomplicated    Rehab Potential Good    PT Frequency 1x / week    PT Duration 6 weeks    PT Treatment/Interventions Taping;Patient/family education;Moist Heat;Ultrasound;Passive range of motion;Therapeutic exercise;Cryotherapy;Electrical Stimulation;Manual techniques;Dry needling;ADLs/Self Care Home Management;Therapeutic activities;Neuromuscular re-education    PT Next Visit Plan L grip strengthening exercises; cervical stabilization - retraction/extension; thoracic mobilization; manual work with DN PRN    Consulted and Agree with Plan of Care Patient           Patient will benefit from skilled therapeutic intervention in order to improve the following deficits and impairments:  Decreased range of motion, Impaired UE functional use, Increased muscle spasms, Pain, Postural dysfunction, Hypomobility, Decreased activity tolerance, Decreased strength, Impaired sensation, Impaired perceived functional ability  Visit Diagnosis: Cervicalgia  Other muscle spasm  Muscle weakness (generalized)  Other symptoms and signs involving the nervous system  Other symptoms and signs involving the musculoskeletal system     Problem List Patient Active Problem List   Diagnosis Date Noted  . Obesity (BMI 30-39.9) 01/22/2020  . Hypokalemia   . Paresthesia of left arm   . Class 1 obesity due to excess calories without serious comorbidity with body mass index (BMI) of 32.0 to 32.9 in adult   . Pure hypercholesterolemia   . Family history of early CAD   . Hot flashes 05/21/2018  . Atypical chest pain 11/17/2016  . Status post gastric bypass for obesity 03/07/2013  . Varicose veins of lower extremities with other complications 44/81/8563    Percival Spanish, PT, MPT 06/05/2020, 12:35 PM  [facility] [address]  [address] [place], [place], [address] Phone: [phone]   Fax:  [phone]  Name: Kristie Martinez MRN: [id number] Date of Birth: 1961/04/22

## 2020-06-08 ENCOUNTER — Ambulatory Visit (INDEPENDENT_AMBULATORY_CARE_PROVIDER_SITE_OTHER): Payer: 59 | Admitting: Family Medicine

## 2020-06-08 ENCOUNTER — Other Ambulatory Visit: Payer: Self-pay

## 2020-06-08 ENCOUNTER — Encounter (INDEPENDENT_AMBULATORY_CARE_PROVIDER_SITE_OTHER): Payer: Self-pay | Admitting: Family Medicine

## 2020-06-08 VITALS — BP 107/74 | HR 72 | Temp 97.8°F | Ht 66.0 in | Wt 201.0 lb

## 2020-06-08 DIAGNOSIS — G4709 Other insomnia: Secondary | ICD-10-CM

## 2020-06-08 DIAGNOSIS — E559 Vitamin D deficiency, unspecified: Secondary | ICD-10-CM | POA: Diagnosis not present

## 2020-06-08 DIAGNOSIS — E669 Obesity, unspecified: Secondary | ICD-10-CM

## 2020-06-08 DIAGNOSIS — Z6831 Body mass index (BMI) 31.0-31.9, adult: Secondary | ICD-10-CM | POA: Diagnosis not present

## 2020-06-08 DIAGNOSIS — E8881 Metabolic syndrome: Secondary | ICD-10-CM

## 2020-06-08 DIAGNOSIS — Z9189 Other specified personal risk factors, not elsewhere classified: Secondary | ICD-10-CM

## 2020-06-09 ENCOUNTER — Ambulatory Visit: Payer: 59 | Admitting: Physical Therapy

## 2020-06-09 ENCOUNTER — Other Ambulatory Visit: Payer: Self-pay

## 2020-06-09 ENCOUNTER — Encounter: Payer: Self-pay | Admitting: Physical Therapy

## 2020-06-09 DIAGNOSIS — M62838 Other muscle spasm: Secondary | ICD-10-CM | POA: Diagnosis not present

## 2020-06-09 DIAGNOSIS — R29818 Other symptoms and signs involving the nervous system: Secondary | ICD-10-CM | POA: Diagnosis not present

## 2020-06-09 DIAGNOSIS — R29898 Other symptoms and signs involving the musculoskeletal system: Secondary | ICD-10-CM | POA: Diagnosis not present

## 2020-06-09 DIAGNOSIS — M542 Cervicalgia: Secondary | ICD-10-CM

## 2020-06-09 DIAGNOSIS — M6281 Muscle weakness (generalized): Secondary | ICD-10-CM | POA: Diagnosis not present

## 2020-06-09 NOTE — Progress Notes (Signed)
Chief Complaint:   OBESITY Kristie Martinez is here to discuss her progress with her obesity treatment plan along with follow-up of her obesity related diagnoses. Kristie Martinez is on the Category 2 Plan and states she is following her eating plan approximately 80% of the time. Kristie Martinez states she is walking/swimming for 20 minutes 7 times per week.  Today's visit was #: 3 Starting weight: 206 lbs Starting date: 05/12/2020 Today's weight: 201 lbs Today's date: 06/08/2020 Total lbs lost to date: 5 lbs Total lbs lost since last in-office visit: 3 lbs  Interim History: Samona has lost 2 pounds since her last office visit. She was asked to focus on increasing protein intake.  She has been having a Premier Protein shake every morning.  She says she is feeling better and healthier.  She has occasional hunger and will eat a [place] roll up or rice cake for snacks.  She has been drinking more water.  She says she has been more active and swimming in her pool as well.  She is feeling better and is going on vacation to [place] all next week.  She will be staying in a condo and will be able to control her meals for the most part.  Subjective:   1. Insulin resistance Kristie Martinez has a diagnosis of insulin resistance based on her elevated fasting insulin level >5. She continues to work on diet and exercise to decrease her risk of diabetes.  Lab Results  Component Value Date   INSULIN 9.5 05/12/2020   Lab Results  Component Value Date   HGBA1C 5.3 05/12/2020   2. Vitamin D deficiency Kristie Martinez's Vitamin D level was 116.0 on 05/12/2020. She is currently taking OTC vitamin D 5000 IU each day. She denies nausea, vomiting or muscle weakness.  3. Other insomnia Kristie Martinez says her insomnia has improved with diet and she has had no need for medication.  She is sleeping well and feels more rested.  She is having no [facility] after 5 pm now as well, and that is helping.  4. At risk for dehydration Kristie Martinez is at risk for  dehydration.  Although she is drinking more water, she is still not at goal.  Assessment/Plan:   1. Insulin resistance Kristie Martinez will continue to work on weight loss, exercise, and decreasing simple carbohydrates to help decrease the risk of diabetes. Kristie Martinez agreed to follow-up with [place] as directed to closely monitor her progress.  2. Vitamin D deficiency Low Vitamin D level contributes to fatigue and are associated with obesity, breast, and colon cancer. She agrees to continue to take OTC Vitamin D @5 ,000 IU daily and will follow-up for routine testing of Vitamin D, at least 2-3 times per year to avoid over-replacement.  3. Other insomnia The problem of recurrent insomnia was discussed. Orders and follow up as documented in patient record. Counseling: Intensive lifestyle modifications are the first line treatment for this issue. We discussed several lifestyle modifications today and she will continue to work on diet, exercise and weight loss efforts.  Continue prudent nutritional plan, weight loss.    Counsel  Limit or avoid alcohol, caffeinated beverages, and cigarettes, especially close to bedtime.   Do not eat a large meal or eat spicy foods right before bedtime. This can lead to digestive discomfort that can make it hard for you to sleep.  Keep a sleep diary to help you and your health care provider figure out what could be causing your insomnia.  . Make your bedroom  a dark, comfortable place where it is easy to fall asleep. ? Put up shades or blackout curtains to block light from outside. ? Use a white noise machine to block noise. ? Keep the temperature cool. . Limit screen use before bedtime. This includes: ? Watching TV. ? Using your smartphone, tablet, or computer. . Stick to a routine that includes going to bed and waking up at the same times every day and night. This can help you fall asleep faster. Consider making a quiet activity, such as reading, part of your nighttime  routine. . Try to avoid taking naps during the day so that you sleep better at night. . Get out of bed if you are still awake after 15 minutes of trying to sleep. Keep the lights down, but try reading or doing a quiet activity. When you feel sleepy, go back to bed.  4. At risk for dehydration Kristie Martinez was given approximately 15 minutes dehydration prevention counseling today. Kristie Martinez is at risk for dehydration due to weight loss and current medication(s). She was encouraged to hydrate and monitor fluid status to avoid dehydration as well as weight loss plateaus.   5. Class 1 obesity with serious comorbidity and body mass index (BMI) of 31.0 to 31.9 in adult, unspecified obesity type Kristie Martinez is currently in the action stage of change. As such, her goal is to continue with weight loss efforts. She has agreed to the Category 2 Plan.   Exercise goals: As is.  Behavioral modification strategies: increasing lean protein intake, increasing water intake (still not at goal but better than prior), no skipping meals, better snacking choices (nighttime choices: [place] bars, Halo Top, etc.), travel eating strategies and holiday eating strategies .  Kristie Martinez has agreed to follow-up with our clinic in 2 weeks. She was informed of the importance of frequent follow-up visits to maximize her success with intensive lifestyle modifications for her multiple health conditions.   Objective:   Blood pressure 107/74, pulse 72, temperature 97.8 F (36.6 C), temperature source Oral, height 5\' 6"  (1.676 m), weight 201 lb (91.2 kg), SpO2 97 %. Body mass index is 32.44 kg/m.  General: Cooperative, alert, well developed, in no acute distress. HEENT: Conjunctivae and lids unremarkable. Cardiovascular: Regular rhythm.  Lungs: Normal work of breathing. Neurologic: No focal deficits.   Lab Results  Component Value Date   CREATININE 0.70 05/12/2020   BUN 11 05/12/2020   NA 142 05/12/2020   K 4.7 05/12/2020   CL 103  05/12/2020   CO2 28 05/12/2020   Lab Results  Component Value Date   ALT 20 05/12/2020   AST 17 05/12/2020   ALKPHOS 105 05/12/2020   BILITOT 0.4 05/12/2020   Lab Results  Component Value Date   HGBA1C 5.3 05/12/2020   HGBA1C 5.3 01/22/2020   Lab Results  Component Value Date   INSULIN 9.5 05/12/2020   Lab Results  Component Value Date   TSH 3.360 05/12/2020   Lab Results  Component Value Date   CHOL 173 05/12/2020   HDL 59 05/12/2020   LDLCALC 98 05/12/2020   TRIG 85 05/12/2020   CHOLHDL 2.9 05/12/2020   Lab Results  Component Value Date   WBC 7.8 05/12/2020   HGB 12.3 05/12/2020   HCT 36.8 05/12/2020   MCV 91 05/12/2020   PLT 345 05/12/2020   Lab Results  Component Value Date   IRON 240 (H) 02/05/2020   FERRITIN 13.2 02/05/2020   Attestation Statements:   Reviewed by clinician on  day of visit: allergies, medications, problem list, medical history, surgical history, family history, social history, and previous encounter notes.  I, Insurance claims handler, CMA, am acting as Energy manager for [employer], DO.  I have reviewed the above documentation for accuracy and completeness, and I agree with the above. Thomasene Lot, DO

## 2020-06-09 NOTE — Patient Instructions (Signed)
    Home exercise program created by Mercedees Convery, PT.  For questions, please contact Alain Deschene via phone at [phone] or email at [email or web address]  [facility] [place] [address]  [address] [place], [place], [address] Phone: [phone]   Fax:  [phone]    

## 2020-06-09 NOTE — Therapy (Signed)
[place] [address]  [place], [place], [address] Phone: [phone]   Fax:  [phone]  Physical Therapy Treatment  Patient Details  Name: Kristie Martinez MRN: [id number] Date of Birth: 1961/07/04 Referring Provider (PT): Dr Kristeen Miss   Encounter Date: 06/09/2020   PT End of Session - 06/09/20 1451    Visit Number 6    Number of Visits 11    Date for PT Re-Evaluation 07/24/20    Authorization Type [facility] UMR    PT Start Time 8280   Pt arrived late   PT Stop Time 1532    PT Time Calculation (min) 41 min    Activity Tolerance Patient tolerated treatment well    Behavior During Therapy [facility] for tasks assessed/performed           Past Medical History:  Diagnosis Date  . Anemia   . Hamstring injury   . Hx of gastric bypass 2004  . Indigestion   . Obesity   . Post-operative nausea and vomiting   . Varicose veins     Past Surgical History:  Procedure Laterality Date  . CERVICAL DISC ARTHROPLASTY  2019  . ENDOMETRIAL ABLATION  2004  . ENDOVENOUS ABLATION SAPHENOUS VEIN W/ LASER  09-27-2012   left greater saphenous vein  by Curt Jews MD  . ENDOVENOUS ABLATION SAPHENOUS VEIN W/ LASER  11-08-2012   right greater saphenous vein   by Curt Jews MD  . mini gastric bypass   2004  . OTHER SURGICAL HISTORY  2004   mini gastric bypass  . TONSILLECTOMY    . TONSILLECTOMY AND ADENOIDECTOMY  1970    There were no vitals filed for this visit.   Subjective Assessment - 06/09/20 1452    Subjective Pt reporting her neck is about the same.    Diagnostic tests MRI ACDF C5-7 looks good, osteophyte C4-5 with minor stenosis    Patient Stated Goals get rid of numbness and tingling and walk up a hill without increased symptoms in arm.    Currently in Pain? Yes    Pain Score 4     Pain Location Neck    Pain Orientation Left    Pain Descriptors / Indicators Constant;Aching;Numbness    Pain Type Acute pain    Pain  Radiating Towards pain into upper medial scapula; intermittent numbness down L arm to 1st 3 digits (worst in thumb)    Pain Frequency Constant                             [facility] Adult PT Treatment/Exercise - 06/09/20 1451      Exercises   Exercises Neck      Neck Exercises: Machines for Strengthening   UBE (Upper Arm Bike) L2.5 x 6 min (3' fwd/3' back)      Hand Exercises for Cervical Radiculopathy   Gross Grasp yellow Theraputty x 20    Pinch Grip yellow Theraputty x 20 each 3-point pinch & key grip      Manual Therapy   Manual Therapy Soft tissue mobilization;Myofascial release;Scapular mobilization    Manual therapy comments skilled palpation and monitoring during DN    Soft tissue mobilization STM/DTM to L UT, LS, rhomboids, subscapularis & teres group    Myofascial Release Manual TPR to L UT, LS, rhomboids & teres group    Scapular Mobilization L scapula in s/l  Trigger Point Dry Needling - 06/09/20 1451    Consent Given? Yes    Muscles Treated Head and Neck Upper trapezius;Levator scapulae   Lt   Muscles Treated Upper Quadrant Rhomboids;Subscapularis;Teres major;Teres minor   Lt   Upper Trapezius Response Twitch reponse elicited;Palpable increased muscle length    Levator Scapulae Response Twitch response elicited;Palpable increased muscle length    Rhomboids Response Twitch response elicited;Palpable increased muscle length    Subscapularis Response Twitch response elicited;Palpable increased muscle length    Teres major Response Twitch response elicited;Palpable increased muscle length    Teres minor Response Twitch response elicited;Palpable increased muscle length                     PT Long Term Goals - 06/05/20 0906      PT LONG TERM GOAL #1   Title Independent with ongoing HEP    Status Partially Met   06/05/20 - met for current HEP   Target Date 07/24/20      PT LONG TERM GOAL #2   Title improve cervical rotation =/>  70 degrees to ease looking for traffic    Status On-going    Target Date 07/24/20      PT LONG TERM GOAL #3   Title report =/> 75% reduction in numbness/tingling into Lt UE    Status On-going   06/05/20 - 50% reduction in L UE numbess/tingling   Target Date 07/24/20      PT LONG TERM GOAL #4   Title report ability to sleep and bend forward with minimal to no cervical pain    Status Partially Met   7//16/21 - not limited due to pain, but notes ongoing L UE numbness with certain sleep positions (laying on L side) or fwd lean   Target Date 07/24/20      PT LONG TERM GOAL #5   Title improve FOTO =/> 45% limited    Status On-going   06/05/20 - FOTO: 48% limited   Target Date 07/24/20                 Plan - 06/09/20 1456    Clinical Impression Statement Fable reports pain and radicular symptoms unchanged since last visit but states it feels better when she swims with crawl stroke (breast stroke makes radiculopathy worse). States she has been playing with the theraputty while at traffic lights when driving - provided instruction in gross grip and pinch grip exercises using yellow theraputty with pt able to perform good return demonstration. Pt requesting DN today as she will be going on vacation next week so she won't be back to PT for 2 weeks. Manual therapy and DN focusing on periscapular muscles as this seems to be where she still experiences most of the tightness and triggers for radicular pain - able to elicit good twitch responses with palpable reduction in muscle tension/tightness. Zacaria reports she is working on coordinating with the [employer] rep to obtain the Flex-IT TENS unit.    Personal Factors and Comorbidities Comorbidity 3+    Comorbidities h/o C5-7 ACDF    Examination-Activity Limitations Bend;Other;Reach Overhead    Examination-Participation Restrictions Community Activity;Other    Stability/Clinical Decision Making Stable/Uncomplicated    Rehab Potential Good    PT  Frequency 1x / week    PT Duration 6 weeks    PT Treatment/Interventions Taping;Patient/family education;Moist Heat;Ultrasound;Passive range of motion;Therapeutic exercise;Cryotherapy;Electrical Stimulation;Manual techniques;Dry needling;ADLs/Self Care Home Management;Therapeutic activities;Neuromuscular re-education    PT Next Visit Plan L  grip strengthening exercises; cervical stabilization - retraction/extension; thoracic mobilization; manual work with DN PRN    Consulted and Agree with Plan of Care Patient           Patient will benefit from skilled therapeutic intervention in order to improve the following deficits and impairments:  Decreased range of motion, Impaired UE functional use, Increased muscle spasms, Pain, Postural dysfunction, Hypomobility, Decreased activity tolerance, Decreased strength, Impaired sensation, Impaired perceived functional ability  Visit Diagnosis: Cervicalgia  Other muscle spasm  Muscle weakness (generalized)  Other symptoms and signs involving the nervous system  Other symptoms and signs involving the musculoskeletal system     Problem List Patient Active Problem List   Diagnosis Date Noted  . Obesity (BMI 30-39.9) 01/22/2020  . Hypokalemia   . Paresthesia of left arm   . Class 1 obesity due to excess calories without serious comorbidity with body mass index (BMI) of 32.0 to 32.9 in adult   . Pure hypercholesterolemia   . Family history of early CAD   . Hot flashes 05/21/2018  . Atypical chest pain 11/17/2016  . Status post gastric bypass for obesity 03/07/2013  . Varicose veins of lower extremities with other complications 54/30/1484    Percival Spanish, PT, MPT 06/09/2020, 3:52 PM  [facility] [address]  [place], [place], [address] Phone: [phone]   Fax:  [phone]  Name: DELETHA JAFFEE MRN: [id number] Date of Birth: 24-Dec-1960

## 2020-06-22 ENCOUNTER — Ambulatory Visit: Payer: 59 | Attending: Neurological Surgery | Admitting: Physical Therapy

## 2020-06-22 ENCOUNTER — Other Ambulatory Visit: Payer: Self-pay

## 2020-06-22 DIAGNOSIS — M542 Cervicalgia: Secondary | ICD-10-CM | POA: Insufficient documentation

## 2020-06-22 DIAGNOSIS — M6281 Muscle weakness (generalized): Secondary | ICD-10-CM | POA: Diagnosis not present

## 2020-06-22 DIAGNOSIS — R29898 Other symptoms and signs involving the musculoskeletal system: Secondary | ICD-10-CM | POA: Diagnosis not present

## 2020-06-22 DIAGNOSIS — M62838 Other muscle spasm: Secondary | ICD-10-CM | POA: Diagnosis not present

## 2020-06-22 DIAGNOSIS — R29818 Other symptoms and signs involving the nervous system: Secondary | ICD-10-CM | POA: Insufficient documentation

## 2020-06-22 NOTE — Therapy (Signed)
[place] [address]  [place], [place], [address] Phone: [phone]   Fax:  [phone]  Physical Therapy Treatment  Patient Details  Name: Kristie Martinez MRN: [id number] Date of Birth: [birth year] Referring Provider (PT): Dr Kristeen Miss   Encounter Date: 06/22/2020   PT End of Session - 06/22/20 1618    Visit Number 7    Number of Visits 11    Date for PT Re-Evaluation 07/24/20    Authorization Type [facility] UMR    PT Start Time 1618    PT Stop Time 1708    PT Time Calculation (min) 50 min    Activity Tolerance Patient tolerated treatment well    Behavior During Therapy [facility] for tasks assessed/performed           Past Medical History:  Diagnosis Date  . Anemia   . Hamstring injury   . Hx of gastric bypass 2004  . Indigestion   . Obesity   . Post-operative nausea and vomiting   . Varicose veins     Past Surgical History:  Procedure Laterality Date  . CERVICAL DISC ARTHROPLASTY  2019  . ENDOMETRIAL ABLATION  2004  . ENDOVENOUS ABLATION SAPHENOUS VEIN W/ LASER  09-27-2012   left greater saphenous vein  by Curt Jews MD  . ENDOVENOUS ABLATION SAPHENOUS VEIN W/ LASER  11-08-2012   right greater saphenous vein   by Curt Jews MD  . mini gastric bypass   2004  . OTHER SURGICAL HISTORY  2004   mini gastric bypass  . TONSILLECTOMY    . TONSILLECTOMY AND ADENOIDECTOMY  1970    There were no vitals filed for this visit.   Subjective Assessment - 06/22/20 1624    Subjective Pt reports things went pretty well on vacation. Only real issues at this point are with walking longer distances and with arms going numb while sleeping.    Currently in Pain? Yes    Pain Score 4    3-4/10   Pain Location Scapula    Pain Orientation Left    Pain Descriptors / Indicators Stabbing   "twisting"   Pain Type Acute pain    Pain Radiating Towards no numbness today    Pain Frequency Intermittent                              [facility] Adult PT Treatment/Exercise - 06/22/20 1618      Exercises   Exercises Neck      Neck Exercises: Machines for Strengthening   UBE (Upper Arm Bike) L2.5 x 6 min (3' fwd/3' back)      Manual Therapy   Manual Therapy Joint mobilization;Soft tissue mobilization;Myofascial release    Manual therapy comments skilled palpation and monitoring during DN    Joint Mobilization L 1st & 2nd rib P/A and inf mobs - reproducing UE radiculopathy    Soft tissue mobilization STM/DTM to L UT, LS and scalenes    Myofascial Release Manual TPR to L UT and scalenes      Neck Exercises: Stretches   Other Neck Stretches L 1st rib mobilization + cervical side bending & rotation 2 x 30 sec each    Other Neck Stretches L anterior & posterior scalene stretches 2 x 30 sec            Trigger Point Dry Needling - 06/22/20 1618    Consent Given? Yes  Muscles Treated Head and Neck Upper trapezius;Scalenes   Lt   Upper Trapezius Response Twitch reponse elicited;Palpable increased muscle length    Scalenes Response Twitch reponse elicited;Palpable increased muscle length                PT Education - 06/22/20 1706    Education Details HEP update - scalene stretches; grip strengthening exercises progressed to red theraputty    Person(s) Educated Patient    Methods Explanation;Demonstration;Handout    Comprehension Verbalized understanding;Returned demonstration               PT Long Term Goals - 06/05/20 0906      PT LONG TERM GOAL #1   Title Independent with ongoing HEP    Status Partially Met   06/05/20 - met for current HEP   Target Date 07/24/20      PT LONG TERM GOAL #2   Title improve cervical rotation =/> 70 degrees to ease looking for traffic    Status On-going    Target Date 07/24/20      PT LONG TERM GOAL #3   Title report =/> 75% reduction in numbness/tingling into Lt UE    Status On-going   06/05/20 - 50% reduction in L UE  numbess/tingling   Target Date 07/24/20      PT LONG TERM GOAL #4   Title report ability to sleep and bend forward with minimal to no cervical pain    Status Partially Met   7//16/21 - not limited due to pain, but notes ongoing L UE numbness with certain sleep positions (laying on L side) or fwd lean   Target Date 07/24/20      PT LONG TERM GOAL #5   Title improve FOTO =/> 45% limited    Status On-going   06/05/20 - FOTO: 48% limited   Target Date 07/24/20                 Plan - 06/22/20 1628    Clinical Impression Statement Kristie Martinez reports her vacation went well but continues to note issues with UEs going numb when she is sleeping or when walking for longer distances. Increased muscle tension in L scalenes elevating L ribs which may be contributing to intermittent numbness - 1st rib mobilizations and manual therapy including DN to L scalenes reproducing L UE numbness during session with pt noting relief by end of session. Reviewed self-1st rib mobilizations and added scalene stretches to HEP to promote further reduction in muscle tension/tightness.    Personal Factors and Comorbidities Comorbidity 3+    Comorbidities h/o C5-7 ACDF    Examination-Activity Limitations Bend;Other;Reach Overhead    Examination-Participation Restrictions Community Activity;Other    Stability/Clinical Decision Making Stable/Uncomplicated    Rehab Potential Good    PT Frequency 1x / week    PT Duration 6 weeks    PT Treatment/Interventions Taping;Patient/family education;Moist Heat;Ultrasound;Passive range of motion;Therapeutic exercise;Cryotherapy;Electrical Stimulation;Manual techniques;Dry needling;ADLs/Self Care Home Management;Therapeutic activities;Neuromuscular re-education    PT Next Visit Plan cervical stabilization - retraction/extension; thoracic & rib mobilization; manual work with DN PRN; L grip strengthening exercises    Consulted and Agree with Plan of Care Patient           Patient  will benefit from skilled therapeutic intervention in order to improve the following deficits and impairments:  Decreased range of motion, Impaired UE functional use, Increased muscle spasms, Pain, Postural dysfunction, Hypomobility, Decreased activity tolerance, Decreased strength, Impaired sensation, Impaired perceived functional ability  Visit Diagnosis:  Cervicalgia  Other muscle spasm  Muscle weakness (generalized)  Other symptoms and signs involving the nervous system  Other symptoms and signs involving the musculoskeletal system     Problem List Patient Active Problem List   Diagnosis Date Noted  . Obesity (BMI 30-39.9) 01/22/2020  . Hypokalemia   . Paresthesia of left arm   . Class 1 obesity due to excess calories without serious comorbidity with body mass index (BMI) of 32.0 to 32.9 in adult   . Pure hypercholesterolemia   . Family history of early CAD   . Hot flashes 05/21/2018  . Atypical chest pain 11/17/2016  . Status post gastric bypass for obesity 03/07/2013  . Varicose veins of lower extremities with other complications 40/37/5436    Percival Spanish, PT, MPT 06/22/2020, 7:25 PM  [facility] [address]  [place], [place], [address] Phone: [phone]   Fax:  [phone]  Name: Kristie Martinez MRN: [id number] Date of Birth: 06-Jan-1961

## 2020-06-22 NOTE — Patient Instructions (Signed)
    Home exercise program created by Tyashia Morrisette, PT.  For questions, please contact Duchess Armendarez via phone at [phone] or email at [email or web address]  [facility] [place] [address]  [address] [place], [place], [address] Phone: [phone]   Fax:  [phone]    

## 2020-06-26 DIAGNOSIS — M542 Cervicalgia: Secondary | ICD-10-CM | POA: Diagnosis not present

## 2020-06-29 ENCOUNTER — Other Ambulatory Visit: Payer: Self-pay

## 2020-06-29 ENCOUNTER — Encounter: Payer: Self-pay | Admitting: Physical Therapy

## 2020-06-29 ENCOUNTER — Encounter (INDEPENDENT_AMBULATORY_CARE_PROVIDER_SITE_OTHER): Payer: Self-pay | Admitting: Family Medicine

## 2020-06-29 ENCOUNTER — Ambulatory Visit: Payer: 59 | Admitting: Physical Therapy

## 2020-06-29 ENCOUNTER — Ambulatory Visit (INDEPENDENT_AMBULATORY_CARE_PROVIDER_SITE_OTHER): Payer: 59 | Admitting: Family Medicine

## 2020-06-29 VITALS — BP 118/77 | HR 75 | Temp 97.3°F | Ht 66.0 in | Wt 203.0 lb

## 2020-06-29 DIAGNOSIS — G4709 Other insomnia: Secondary | ICD-10-CM | POA: Diagnosis not present

## 2020-06-29 DIAGNOSIS — M62838 Other muscle spasm: Secondary | ICD-10-CM

## 2020-06-29 DIAGNOSIS — M542 Cervicalgia: Secondary | ICD-10-CM | POA: Diagnosis not present

## 2020-06-29 DIAGNOSIS — Z6832 Body mass index (BMI) 32.0-32.9, adult: Secondary | ICD-10-CM | POA: Diagnosis not present

## 2020-06-29 DIAGNOSIS — E669 Obesity, unspecified: Secondary | ICD-10-CM | POA: Diagnosis not present

## 2020-06-29 DIAGNOSIS — R29898 Other symptoms and signs involving the musculoskeletal system: Secondary | ICD-10-CM | POA: Diagnosis not present

## 2020-06-29 DIAGNOSIS — M6281 Muscle weakness (generalized): Secondary | ICD-10-CM

## 2020-06-29 DIAGNOSIS — E559 Vitamin D deficiency, unspecified: Secondary | ICD-10-CM | POA: Diagnosis not present

## 2020-06-29 DIAGNOSIS — R29818 Other symptoms and signs involving the nervous system: Secondary | ICD-10-CM | POA: Diagnosis not present

## 2020-06-29 DIAGNOSIS — Z9189 Other specified personal risk factors, not elsewhere classified: Secondary | ICD-10-CM

## 2020-06-29 DIAGNOSIS — E8881 Metabolic syndrome: Secondary | ICD-10-CM | POA: Diagnosis not present

## 2020-06-29 MED FILL — INDOMETHACIN 50 MG CAPSULE: 50 | 30 days supply | Qty: 90 | Fill #3

## 2020-06-29 MED FILL — METAXALONE 800 MG TABS: 800 | 30 days supply | Qty: 90 | Fill #3

## 2020-06-29 NOTE — Therapy (Signed)
[facility] [place] [address]  [place], [place], [address] Phone: [phone]   Fax:  [phone]  Physical Therapy Treatment  Patient Details  Name: Kristie Martinez MRN: [id number] Date of Birth: November 19, 1961 Referring Provider (PT): Dr Kristeen Miss   Encounter Date: 06/29/2020   PT End of Session - 06/29/20 1620    Visit Number 8    Number of Visits 11    Date for PT Re-Evaluation 07/24/20    Authorization Type [facility]    PT Start Time 1620    PT Stop Time 1704    PT Time Calculation (min) 44 min    Activity Tolerance Patient tolerated treatment well    Behavior During Therapy [facility] for tasks assessed/performed           Past Medical History:  Diagnosis Date  . Anemia   . Hamstring injury   . Hx of gastric bypass 2004  . Indigestion   . Obesity   . Post-operative nausea and vomiting   . Varicose veins     Past Surgical History:  Procedure Laterality Date  . CERVICAL DISC ARTHROPLASTY  2019  . ENDOMETRIAL ABLATION  2004  . ENDOVENOUS ABLATION SAPHENOUS VEIN W/ LASER  09-27-2012   left greater saphenous vein  by Curt Jews MD  . ENDOVENOUS ABLATION SAPHENOUS VEIN W/ LASER  11-08-2012   right greater saphenous vein   by Curt Jews MD  . mini gastric bypass   2004  . OTHER SURGICAL HISTORY  2004   mini gastric bypass  . TONSILLECTOMY    . TONSILLECTOMY AND ADENOIDECTOMY  1970    There were no vitals filed for this visit.   Subjective Assessment - 06/29/20 1623    Subjective Pt reports she has been working more on the computer today - notes increased "stress-related" pain in lower neck but no persiscapular pain today.    Diagnostic tests MRI ACDF C5-7 looks good, osteophyte C4-5 with minor stenosis    Patient Stated Goals get rid of numbness and tingling and walk up a hill without increased symptoms in arm.    Currently in Pain? Yes    Pain Score 2    2-3/10   Pain Location Neck    Pain Orientation Lower      Pain Descriptors / Indicators Aching    Pain Type Acute pain    Pain Frequency Intermittent              [facility] PT Assessment - 06/29/20 1620      Assessment   Medical Diagnosis Lt cervical radiculopathy    Referring Provider (PT) Dr Kristeen Miss    Onset Date/Surgical Date --   third week in March   Hand Dominance Right    Next MD Visit 07/22/20      AROM   Cervical Flexion 37    Cervical Extension 40   L arm numbness   Cervical - Right Side Bend 25    Cervical - Left Side Bend 14   L UE numbness   Cervical - Right Rotation 64    Cervical - Left Rotation 49                         [facility] Adult PT Treatment/Exercise - 06/29/20 1620      Exercises   Exercises Neck      Neck Exercises: Machines for Strengthening   Nustep L6 x 6' (UE/LE)  Neck Exercises: Seated   Cervical Isometrics Extension;5 reps;5 secs    Cervical Isometrics Limitations yellow TB    Neck Retraction 10 reps;5 secs    Neck Retraction Limitations yellow TB    Cervical Rotation Left;Right;10 reps    Cervical Rotation Limitations cervical SNAGs with pillowcase      Neck Exercises: Supine   Neck Retraction 10 reps;5 secs    Neck Retraction Limitations into PT hands    Other Supine Exercise Thoracic mobilization on foam roll      Manual Therapy   Manual Therapy Joint mobilization;Soft tissue mobilization;Myofascial release;Manual Traction;Passive ROM    Joint Mobilization C3-4 grade II-III side glides and UPAs    Soft tissue mobilization STM/DTM to L>R cervical parapsinals, UT, LS and scalenes    Myofascial Release Manual TPR to L cervical paraspinals, esp splenius capitus    Passive ROM gentle cervical SNAGs     Manual Traction gentle manual distraction                  PT Education - 06/29/20 1704    Education Details HEP update - cervical rotation SNAGs    Person(s) Educated Patient    Methods Explanation;Demonstration;Handout    Comprehension Verbalized  understanding;Returned demonstration;Need further instruction               PT Long Term Goals - 06/29/20 1627      PT LONG TERM GOAL #1   Title Independent with ongoing HEP    Status Partially Met   06/29/20 - met for current HEP   Target Date 07/24/20      PT LONG TERM GOAL #2   Title improve cervical rotation =/> 70 degrees to ease looking for traffic    Status On-going    Target Date 07/24/20      PT LONG TERM GOAL #3   Title report =/> 75% reduction in numbness/tingling into Lt UE    Status Partially Met    Target Date 07/24/20      PT LONG TERM GOAL #4   Title report ability to sleep and bend forward with minimal to no cervical pain    Status Partially Met    Target Date 07/24/20      PT LONG TERM GOAL #5   Title improve FOTO =/> 45% limited    Status On-going   06/05/20 - FOTO: 48% limited   Target Date 07/24/20                 Plan - 06/29/20 1704    Clinical Impression Statement Kristie Martinez notes increased lower neck pain today after spending a lot of time on the computer today but denies any scapular pain as she has had in recent visits. She continues to favor a forward head and rounded shoulder posture requiring repeated cues to improve alignment. Reinforce postural awareness/strengthening with yellow TB resisted cervical extension isometric and AROM into cervical retraction. Cervical ROM improving in flexion and extension but less so with lateral flexion (somewhat limited by h/o fusion) and rotation. Ongoing L UE numbness triggered with extension and L side bending, although no longer noted with L rotation. Introduced cervical SNAGs for rotation with improved AROM and PROM noted, therefore instructions provided for HEP.    Personal Factors and Comorbidities Comorbidity 3+    Comorbidities h/o C5-7 ACDF    Examination-Activity Limitations Bend;Other;Reach Overhead    Examination-Participation Restrictions Community Activity;Other    Stability/Clinical Decision  Making Stable/Uncomplicated    Rehab Potential Good  PT Frequency 1x / week    PT Duration 6 weeks    PT Treatment/Interventions Taping;Patient/family education;Moist Heat;Ultrasound;Passive range of motion;Therapeutic exercise;Cryotherapy;Electrical Stimulation;Manual techniques;Dry needling;ADLs/Self Care Home Management;Therapeutic activities;Neuromuscular re-education    PT Next Visit Plan cervical stabilization - retraction/extension; cervical ROM; thoracic & rib mobilization; manual work with DN PRN; L grip strengthening exercises    Consulted and Agree with Plan of Care Patient           Patient will benefit from skilled therapeutic intervention in order to improve the following deficits and impairments:  Decreased range of motion, Impaired UE functional use, Increased muscle spasms, Pain, Postural dysfunction, Hypomobility, Decreased activity tolerance, Decreased strength, Impaired sensation, Impaired perceived functional ability  Visit Diagnosis: Cervicalgia  Other muscle spasm  Muscle weakness (generalized)  Other symptoms and signs involving the nervous system  Other symptoms and signs involving the musculoskeletal system     Problem List Patient Active Problem List   Diagnosis Date Noted  . Obesity (BMI 30-39.9) 01/22/2020  . Hypokalemia   . Paresthesia of left arm   . Class 1 obesity due to excess calories without serious comorbidity with body mass index (BMI) of 32.0 to 32.9 in adult   . Pure hypercholesterolemia   . Family history of early CAD   . Hot flashes 05/21/2018  . Atypical chest pain 11/17/2016  . Status post gastric bypass for obesity 03/07/2013  . 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  [address] [place], [place], [address] Phone: [phone]   Fax:  [phone]  Name: Kristie Martinez MRN: [id number] Date of Birth: [birth year]

## 2020-06-29 NOTE — Patient Instructions (Signed)
    Home exercise program created by Kaylon Hitz, PT.  For questions, please contact Parnell Spieler via phone at [phone] or email at [email or web address]  [facility] [place] [address]  [address] [place], [place], [address] Phone: [phone]   Fax:  [phone]    

## 2020-06-29 NOTE — Progress Notes (Signed)
Chief Complaint:   OBESITY Kristie Martinez is here to discuss her progress with her obesity treatment plan along with follow-up of her obesity related diagnoses. Kristie Martinez is on the Category 2 Plan and states she is following her eating plan approximately 30% of the time. Kristie Martinez states she is exercising for 0 minutes 0 times per week.  Today's visit was #: 4 Starting weight: 206 lbs Starting date: 05/12/2020 Today's weight: 203 lbs Today's date: 06/29/2020 Total lbs lost to date: 3 lbs Total lbs lost since last in-office visit: 0  Interim History: Kristie Martinez was on vacation for a week and says she went out to eat a lot.  Her husband got pneumonia and was really sick for a couple of weeks.  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  She has been skipping meals, but she has drank more water and continues to decrease her [employer].  Her brother wears an oxygen tank and lost his wife recently to pancreatic cancer.  She says she has a lot more stress in her life and has been drinking a little more wine than usual.  She says she has had more celebration eating and drinking going on lately.    Subjective:   1. Vitamin D deficiency Machel's Vitamin D level was 116.0 on 05/12/2020. She is currently taking OTC vitamin D 5000 IU each day. She denies nausea, vomiting or muscle weakness.  2. Insulin resistance Kristie Martinez has a diagnosis of insulin resistance based on her elevated fasting insulin level >5. She continues to work on diet and exercise to decrease her risk of diabetes.  Lab Results  Component Value Date   INSULIN 9.5 05/12/2020   Lab Results  Component Value Date   HGBA1C 5.3 05/12/2020   3. Other insomnia Occasionally difficult to sleep, but often gets to bed much later than desired.  She is occasionally using Benadryl.  She used trazodone in the past, but declines the need for that now.  4. At risk for depression Kristie Martinez is at elevated  risk of depression due to work stressors.  Assessment/Plan:   1. Vitamin D deficiency Low Vitamin D level contributes to fatigue and are associated with obesity, breast, and colon cancer. She agrees to continue to take OTC Vitamin D @5 ,000 IU daily and will follow-up for routine testing of Vitamin D, at least 2-3 times per year to avoid over-replacement.  2. Insulin resistance Suni will continue to work on weight loss, exercise, and decreasing simple carbohydrates to help decrease the risk of diabetes. Landri agreed to follow-up with [name] as directed to closely monitor her progress.  3. Other insomnia The problem of recurrent insomnia was discussed. Orders and follow up as documented in patient record. Counseling: Intensive lifestyle modifications are the first line treatment for this issue. We discussed several lifestyle modifications today and she will continue to work on diet, exercise and weight loss efforts.  Sleep hygiene discussed with the patient.  Advised against Benadryl.  She will let us know if she needs medication assistance in the future.  Counseling  Limit or avoid alcohol, caffeinated beverages, and cigarettes, especially close to bedtime.   Do not eat a large meal or eat spicy foods right before bedtime. This can lead to digestive discomfort that can make it hard for you to sleep.  Keep a sleep diary to help you and your health care provider figure out what could be causing  your insomnia.  . Make your bedroom a dark, comfortable place where it is easy to fall asleep. ? Put up shades or blackout curtains to block light from outside. ? Use a white noise machine to block noise. ? Keep the temperature cool. . Limit screen use before bedtime. This includes: ? Watching TV. ? Using your smartphone, tablet, or computer. . Stick to a routine that includes going to bed and waking up at the same times every day and night. This can help you fall asleep faster. Consider making a quiet  activity, such as reading, part of your nighttime routine. . Try to avoid taking naps during the day so that you sleep better at night. . Get out of bed if you are still awake after 15 minutes of trying to sleep. Keep the lights down, but try reading or doing a quiet activity. When you feel sleepy, go back to bed.  4. At risk for depression Kristie Martinez was given approximately 15 minutes of depression risk counseling today. She has risk factors for depression including work stress. We discussed the importance of a healthy work life balance, a healthy relationship with food and a good support system.  Repetitive spaced learning was employed today to elicit superior memory formation and behavioral change.  5. Class 1 obesity with serious comorbidity and body mass index (BMI) of 32.0 to 32.9 in adult, unspecified obesity type Kristie Martinez is currently in the action stage of change. As such, her goal is to continue with weight loss efforts. She has agreed to the Category 2 Plan.   Exercise goals: As is.  For next visit:  Walk for 20 minutes, 3 days per week.  Behavioral modification strategies: increasing lean protein intake, decreasing simple carbohydrates, increasing water intake, meal planning and cooking strategies and planning for success.  For next visit:  Meal prep with various proteins available.  Kristie Martinez has agreed to follow-up with our clinic in 2-3 weeks. She was informed of the importance of frequent follow-up visits to maximize her success with intensive lifestyle modifications for her multiple health conditions.   Objective:   Blood pressure 118/77, pulse 75, temperature (!) 97.3 F (36.3 C), height 5\' 6"  (1.676 m), weight 203 lb (92.1 kg), SpO2 99 %. Body mass index is 32.77 kg/m.  General: Cooperative, alert, well developed, in no acute distress. HEENT: Conjunctivae and lids unremarkable. Cardiovascular: Regular rhythm.  Lungs: Normal work of breathing. Neurologic: No focal deficits.    Lab Results  Component Value Date   CREATININE 0.70 05/12/2020   BUN 11 05/12/2020   NA 142 05/12/2020   K 4.7 05/12/2020   CL 103 05/12/2020   CO2 28 05/12/2020   Lab Results  Component Value Date   ALT 20 05/12/2020   AST 17 05/12/2020   ALKPHOS 105 05/12/2020   BILITOT 0.4 05/12/2020   Lab Results  Component Value Date   HGBA1C 5.3 05/12/2020   HGBA1C 5.3 01/22/2020   Lab Results  Component Value Date   INSULIN 9.5 05/12/2020   Lab Results  Component Value Date   TSH 3.360 05/12/2020   Lab Results  Component Value Date   CHOL 173 05/12/2020   HDL 59 05/12/2020   LDLCALC 98 05/12/2020   TRIG 85 05/12/2020   CHOLHDL 2.9 05/12/2020   Lab Results  Component Value Date   WBC 7.8 05/12/2020   HGB 12.3 05/12/2020   HCT 36.8 05/12/2020   MCV 91 05/12/2020   PLT 345 05/12/2020   Lab Results  Component Value Date   IRON 240 (H) 02/05/2020   FERRITIN 13.2 02/05/2020   Attestation Statements:   Reviewed by clinician on day of visit: allergies, medications, problem list, medical history, surgical history, family history, social history, and previous encounter notes.  I, Insurance claims handler, CMA, am acting as Energy manager for [employer], DO.  I have reviewed the above documentation for accuracy and completeness, and I agree with the above. Thomasene Lot, DO

## 2020-07-08 MED FILL — METAXALONE 800 MG TABS: 800 | 30 days supply | Qty: 90 | Fill #3

## 2020-07-08 MED FILL — INDOMETHACIN 50 MG CAPSULE: 50 | 30 days supply | Qty: 90 | Fill #3

## 2020-07-16 ENCOUNTER — Encounter: Payer: Self-pay | Admitting: Physical Therapy

## 2020-07-16 ENCOUNTER — Encounter (INDEPENDENT_AMBULATORY_CARE_PROVIDER_SITE_OTHER): Payer: Self-pay

## 2020-07-16 ENCOUNTER — Ambulatory Visit: Payer: 59 | Admitting: Physical Therapy

## 2020-07-16 ENCOUNTER — Other Ambulatory Visit: Payer: Self-pay

## 2020-07-16 DIAGNOSIS — M542 Cervicalgia: Secondary | ICD-10-CM

## 2020-07-16 DIAGNOSIS — M6281 Muscle weakness (generalized): Secondary | ICD-10-CM

## 2020-07-16 DIAGNOSIS — R29898 Other symptoms and signs involving the musculoskeletal system: Secondary | ICD-10-CM

## 2020-07-16 DIAGNOSIS — R29818 Other symptoms and signs involving the nervous system: Secondary | ICD-10-CM

## 2020-07-16 DIAGNOSIS — M62838 Other muscle spasm: Secondary | ICD-10-CM | POA: Diagnosis not present

## 2020-07-16 NOTE — Therapy (Signed)
[facility] [place] [address]  [place], [place], [address] Phone: [phone]   Fax:  [phone]  Physical Therapy Treatment  Patient Details  Name: Kristie Martinez MRN: [id number] Date of Birth: [birth year] Referring Provider (PT): Dr Kristeen Miss   Encounter Date: 07/16/2020   PT End of Session - 07/16/20 1709    Visit Number 9    Number of Visits 11    Date for PT Re-Evaluation 07/24/20    Authorization Type [facility]    PT Start Time 6734   Pt arrived late   PT Stop Time 1754    PT Time Calculation (min) 45 min    Activity Tolerance Patient tolerated treatment well    Behavior During Therapy [facility] for tasks assessed/performed           Past Medical History:  Diagnosis Date  . Anemia   . Hamstring injury   . Hx of gastric bypass 2004  . Indigestion   . Obesity   . Post-operative nausea and vomiting   . Varicose veins     Past Surgical History:  Procedure Laterality Date  . CERVICAL DISC ARTHROPLASTY  2019  . ENDOMETRIAL ABLATION  2004  . ENDOVENOUS ABLATION SAPHENOUS VEIN W/ LASER  09-27-2012   left greater saphenous vein  by Curt Jews MD  . ENDOVENOUS ABLATION SAPHENOUS VEIN W/ LASER  11-08-2012   right greater saphenous vein   by Curt Jews MD  . mini gastric bypass   2004  . OTHER SURGICAL HISTORY  2004   mini gastric bypass  . TONSILLECTOMY    . TONSILLECTOMY AND ADENOIDECTOMY  1970    There were no vitals filed for this visit.   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 Same thing happens any time she tries to push something or when she leans over.    Diagnostic tests MRI ACDF C5-7 looks good, osteophyte C4-5 with minor stenosis    Patient Stated Goals get rid of numbness and tingling and walk up a hill without increased symptoms in arm.    Currently in Pain? Yes    Pain  Score 3     Pain Location Scapula    Pain Orientation Left    Pain Descriptors / Indicators Stabbing;Numbness    Pain Radiating Towards numbness into L arm    Aggravating Factors  pushing things, walking up incline, bending over, sitting in too low of a chair              [facility] PT Assessment - 07/16/20 0001      Assessment   Medical Diagnosis                           [facility] Adult PT Treatment/Exercise - 07/16/20 1709      Self-Care   Self-Care Posture    Posture Provided instruction in neutral cerivcal spine alignment while sleeping and during activities that typically trigger her L UE radilculopathy.      Neck Exercises: Machines for Strengthening   Nustep L6 x 6' (UE/LE)      Neck Exercises: Seated   Other Seated Exercise Thoracic mobilization/extension over back of chair + cervical retaction (avoiding cervical extension) x 10      Neck Exercises: Supine   Other Supine  Exercise Thoracic mobilization/extension over pool noodle x 10      Neck Exercises: Prone   Neck Retraction 10 reps;5 secs    Shoulder Extension 5 reps    Shoulder Extension Limitations + scap retraction prone over green Pball - cues for neutral cervical spine/chin tuck, avoiding cervical extension   discontinued due to UE numbness                      PT Long Term Goals - 06/29/20 1627      PT LONG TERM GOAL #1   Title Independent with ongoing HEP    Status Partially Met   06/29/20 - met for current HEP   Target Date 07/24/20      PT LONG TERM GOAL #2   Title improve cervical rotation =/> 70 degrees to ease looking for traffic    Status On-going    Target Date 07/24/20      PT LONG TERM GOAL #3   Title report =/> 75% reduction in numbness/tingling into Lt UE    Status Partially Met    Target Date 07/24/20      PT LONG TERM GOAL #4   Title report ability to sleep and bend forward with minimal to no cervical pain    Status Partially Met    Target Date 07/24/20        PT LONG TERM GOAL #5   Title improve FOTO =/> 45% limited    Status On-going   06/05/20 - FOTO: 48% limited   Target Date 07/24/20                 Plan - 07/16/20 1718    Clinical Impression Statement Kristie Martinez reports less pain in neck and upper back but reports exacerbation of her L UE radicular numbness 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 She continues to experience worsening of her numbness anytime she tries to push something, walks up an incline or bends over to pick something up - triggering motion appears to be cervical extension with these activities which is likely contributing to narrowing of foraminal or lateral stenosis creating her numbness, therefore therapy session focusing on postural correction during these activities to promote more neutral spine to avoid compression on nerve roots. Pt struggling to achieve proper spinal alignment with some of exercises despite repeated VC and TC.    Personal Factors and Comorbidities Comorbidity 3+    Comorbidities h/o C5-7 ACDF    Examination-Activity Limitations Bend;Other;Reach Overhead    Examination-Participation Restrictions Community Activity;Other    Stability/Clinical Decision Making Stable/Uncomplicated    Rehab Potential Good    PT Frequency 1x / week    PT Duration 6 weeks    PT Treatment/Interventions Taping;Patient/family education;Moist Heat;Ultrasound;Passive range of motion;Therapeutic exercise;Cryotherapy;Electrical Stimulation;Manual techniques;Dry needling;ADLs/Self Care Home Management;Therapeutic activities;Neuromuscular re-education    PT Next Visit Plan MD progress note for appt 07/22/20; cervical stabilization - retraction/extension; cervical ROM; thoracic & rib mobilization; manual work with DN PRN; L grip strengthening exercises    Consulted and Agree with Plan of Care Patient           Patient will benefit from skilled therapeutic intervention in order  to improve the following deficits and impairments:  Decreased range of motion, Impaired UE functional use, Increased muscle spasms, Pain, Postural dysfunction, Hypomobility, Decreased activity tolerance, Decreased strength, Impaired sensation, Impaired perceived functional ability  Visit Diagnosis: Cervicalgia  Other muscle spasm  Muscle weakness (generalized)  Other symptoms and signs involving the nervous system  Other symptoms and signs involving the musculoskeletal system     Problem List Patient Active Problem List   Diagnosis Date Noted  . Obesity (BMI 30-39.9) 01/22/2020  . Hypokalemia   . Paresthesia of left arm   . Class 1 obesity due to excess calories without serious comorbidity with body mass index (BMI) of 32.0 to 32.9 in adult   . Pure hypercholesterolemia   . Family history of early CAD   . Hot flashes 05/21/2018  . Atypical chest pain 11/17/2016  . Status post gastric bypass for obesity 03/07/2013  . 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  [place], [place], [address] Phone: [phone]   Fax:  [phone]  Name: Kristie Martinez MRN: [id number] Date of Birth: 1961/03/15

## 2020-07-20 ENCOUNTER — Ambulatory Visit (INDEPENDENT_AMBULATORY_CARE_PROVIDER_SITE_OTHER): Payer: 59 | Admitting: Family Medicine

## 2020-07-21 ENCOUNTER — Ambulatory Visit: Payer: 59 | Admitting: Physical Therapy

## 2020-07-21 ENCOUNTER — Other Ambulatory Visit: Payer: Self-pay

## 2020-07-21 ENCOUNTER — Encounter: Payer: Self-pay | Admitting: Physical Therapy

## 2020-07-21 DIAGNOSIS — R29818 Other symptoms and signs involving the nervous system: Secondary | ICD-10-CM

## 2020-07-21 DIAGNOSIS — M542 Cervicalgia: Secondary | ICD-10-CM

## 2020-07-21 DIAGNOSIS — M6281 Muscle weakness (generalized): Secondary | ICD-10-CM | POA: Diagnosis not present

## 2020-07-21 DIAGNOSIS — M62838 Other muscle spasm: Secondary | ICD-10-CM | POA: Diagnosis not present

## 2020-07-21 DIAGNOSIS — R29898 Other symptoms and signs involving the musculoskeletal system: Secondary | ICD-10-CM

## 2020-07-21 NOTE — Therapy (Signed)
[facility] [address]  [place], [place], [address] Phone: [phone]   Fax:  [phone]  Physical Therapy Progress Note  Patient Details  Name: Kristie Martinez MRN: [id number] Date of Birth: April 22, 1961 Referring Provider (PT): Dr Kristeen Miss   Encounter Date: 07/21/2020   PT End of Session - 07/21/20 0812    Visit Number 10    Number of Visits 11    Date for PT Re-Evaluation 07/24/20    Authorization Type [facility]    PT Start Time 0812   Pt arrived late   PT Stop Time 0847    PT Time Calculation (min) 35 min    Activity Tolerance Patient tolerated treatment well    Behavior During Therapy [facility] for tasks assessed/performed           Past Medical History:  Diagnosis Date  . Anemia   . Hamstring injury   . Hx of gastric bypass 2004  . Indigestion   . Obesity   . Post-operative nausea and vomiting   . Varicose veins     Past Surgical History:  Procedure Laterality Date  . CERVICAL DISC ARTHROPLASTY  2019  . ENDOMETRIAL ABLATION  2004  . ENDOVENOUS ABLATION SAPHENOUS VEIN W/ LASER  09-27-2012   left greater saphenous vein  by Curt Jews MD  . ENDOVENOUS ABLATION SAPHENOUS VEIN W/ LASER  11-08-2012   right greater saphenous vein   by Curt Jews MD  . mini gastric bypass   2004  . OTHER SURGICAL HISTORY  2004   mini gastric bypass  . TONSILLECTOMY    . TONSILLECTOMY AND ADENOIDECTOMY  1970    There were no vitals filed for this visit.   Subjective Assessment - 07/21/20 0813    Subjective Pt reports she is "coming into PT 'stress free' today" with no pain reported. States she was able to sleep better after pillow adjustment discussed last session, but the next night reported radicular symptoms even before she went to bed (no problems since that Friday night).    Diagnostic tests MRI ACDF C5-7 looks good, osteophyte C4-5 with minor stenosis    Patient Stated Goals get rid of numbness and tingling and  walk up a hill without increased symptoms in arm.    Currently in Pain? No/denies    Pain Score 0-No pain    Pain Location Arm    Pain Orientation Left    Pain Descriptors / Indicators Numbness;Tingling    Pain Radiating Towards intermittent numbness and tingling    Pain Frequency Intermittent    Aggravating Factors  pushing things, walking up incline, bending over, sitting in too low of a chair              [facility] PT Assessment - 07/21/20 0812      Assessment   Medical Diagnosis Lt cervical radiculopathy    Referring Provider (PT) Dr Kristeen Miss    Onset Date/Surgical Date --   third week in March   Hand Dominance Right    Next MD Visit 07/22/20      Observation/Other Assessments   Focus on Therapeutic Outcomes (FOTO)  Neck - 54% (46% limited)      AROM   Cervical Flexion 38    Cervical Extension 43   mild L arm numbness   Cervical - Right Side Bend 28    Cervical - Left Side Bend 20    Cervical - Right Rotation 64  Cervical - Left Rotation 57   mild L arm numbness     Strength   Right Shoulder Flexion 4+/5    Right Shoulder ABduction 4+/5    Right Shoulder Internal Rotation 4+/5    Right Shoulder External Rotation 4+/5    Left Shoulder Flexion 4+/5   holding testing position causes L UE numbness   Left Shoulder ABduction 4+/5    Left Shoulder Internal Rotation 4+/5    Left Shoulder External Rotation 4/5    Right Hand Grip (lbs) 32.33   22, 39, 36   Right Hand Lateral Pinch 10 lbs   10, 10, 10   Right Hand 3 Point Pinch 7 lbs   6, 8, 7   Left Hand Grip (lbs) 26.33   35, 27, 17   Left Hand Lateral Pinch 9 lbs   10, 8, 9   Left Hand 3 Point Pinch 4 lbs   5, 4, 3                                     PT Long Term Goals - 07/21/20 0818      PT LONG TERM GOAL #1   Title Independent with ongoing HEP    Status Achieved   07/21/20     PT LONG TERM GOAL #2   Title improve cervical rotation =/> 70 degrees to ease looking for traffic     Status Not Met      PT LONG TERM GOAL #3   Title report =/> 75% reduction in numbness/tingling into Lt UE    Status Partially Met   8/31 - 50% improvement - numbness & tingling no longer constant, but still intermittent with walking inclines, leaning forward or pushing     PT LONG TERM GOAL #4   Title report ability to sleep and bend forward with minimal to no cervical pain    Status Partially Met   8/30 - no longer limited by pain, but still experiencing intermittent numbness and tingling     PT LONG TERM GOAL #5   Title improve FOTO =/> 45% limited    Status Partially Met   07/21/20 - FOTO: 46% limited                Plan - 07/21/20 0847    Clinical Impression Statement Lillyauna reports benefit from PT with pain mostly resolved and numbness and tingling less frequent and persistent than previously, although she reports L UE numbness and tingling still triggered 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 She no longer experiences the issues with increased muscle tension TPs and has improved her cervical ROM in all planes, but still notes functional limitation with cervical extension and L rotation as these motions will trigger the numbness and tingling. B shoulder strength now grossly 4+/5 with significant gains noted in grip strength, especially on L. FOTO within 1% of predicted outcome. Goals currently partially met. Given ongoing issues with intermittent numbness and tingling, Jahnyla will be following up with MD to determine future course of action, therefore will place therapy services on hold for 30 days pending plan from MD visit.    Personal Factors and Comorbidities Comorbidity 3+    Comorbidities h/o C5-7 ACDF    Examination-Activity Limitations Bend;Reach  Overhead;Lift;Carry    Examination-Participation Restrictions  Community Activity;Occupation;Driving    Rehab Potential Good    PT Treatment/Interventions Taping;Patient/family education;Moist Heat;Ultrasound;Passive range of motion;Therapeutic exercise;Cryotherapy;Electrical Stimulation;Manual techniques;Dry needling;ADLs/Self Care Home Management;Therapeutic activities;Neuromuscular re-education    PT Next Visit Plan 30-day hold    Consulted and Agree with Plan of Care Patient           Patient will benefit from skilled therapeutic intervention in order to improve the following deficits and impairments:  Decreased range of motion, Impaired UE functional use, Increased muscle spasms, Pain, Postural dysfunction, Hypomobility, Decreased activity tolerance, Decreased strength, Impaired sensation, Impaired perceived functional ability  Visit Diagnosis: Cervicalgia  Other muscle spasm  Muscle weakness (generalized)  Other symptoms and signs involving the nervous system  Other symptoms and signs involving the musculoskeletal system     Problem List Patient Active Problem List   Diagnosis Date Noted  . Obesity (BMI 30-39.9) 01/22/2020  . Hypokalemia   . Paresthesia of left arm   . Class 1 obesity due to excess calories without serious comorbidity with body mass index (BMI) of 32.0 to 32.9 in adult   . Pure hypercholesterolemia   . Family history of early CAD   . Hot flashes 05/21/2018  . Atypical chest pain 11/17/2016  . Status post gastric bypass for obesity 03/07/2013  . Varicose veins of lower extremities with other complications 61/95/0932    Percival Spanish, PT, MPT 07/21/2020, 12:47 PM  [facility] [address]  [place], [place], [address] Phone: [phone]   Fax:  [phone]  Name: CHEREE FOWLES MRN: [id number] Date of Birth: November 28, 1960

## 2020-07-22 DIAGNOSIS — M5412 Radiculopathy, cervical region: Secondary | ICD-10-CM | POA: Diagnosis not present

## 2020-07-27 DIAGNOSIS — M542 Cervicalgia: Secondary | ICD-10-CM | POA: Diagnosis not present

## 2020-08-04 DIAGNOSIS — H16303 Unspecified interstitial keratitis, bilateral: Secondary | ICD-10-CM | POA: Diagnosis not present

## 2020-08-13 DIAGNOSIS — H02052 Trichiasis without entropian right lower eyelid: Secondary | ICD-10-CM | POA: Diagnosis not present

## 2020-08-13 DIAGNOSIS — H16303 Unspecified interstitial keratitis, bilateral: Secondary | ICD-10-CM | POA: Diagnosis not present

## 2020-08-26 DIAGNOSIS — M542 Cervicalgia: Secondary | ICD-10-CM | POA: Diagnosis not present

## 2020-08-28 MED FILL — METAXALONE 800 MG TABLET: 800 | 30 days supply | Qty: 90 | Fill #4

## 2020-08-28 MED FILL — INDOMETHACIN 50 MG CAPSULE: 50 | 30 days supply | Qty: 90 | Fill #4

## 2020-09-21 ENCOUNTER — Other Ambulatory Visit (HOSPITAL_COMMUNITY): Payer: Self-pay | Admitting: Unknown Physician Specialty

## 2020-09-21 DIAGNOSIS — E663 Overweight: Secondary | ICD-10-CM | POA: Diagnosis not present

## 2020-09-21 MED FILL — PHENTERMINE 37.5 MG TABLET: 37.5 | 60 days supply | Qty: 60 | Fill #0

## 2020-09-26 DIAGNOSIS — M542 Cervicalgia: Secondary | ICD-10-CM | POA: Diagnosis not present

## 2020-10-01 ENCOUNTER — Observation Stay (HOSPITAL_COMMUNITY): Payer: 59

## 2020-10-01 ENCOUNTER — Observation Stay (HOSPITAL_COMMUNITY)
Admission: AD | Admit: 2020-10-01 | Discharge: 2020-10-02 | Disposition: A | Discharge status: Home / Self Care | Payer: 59 | Source: Ambulatory Visit | Attending: Neurological Surgery | Admitting: Neurological Surgery

## 2020-10-01 DIAGNOSIS — Z981 Arthrodesis status: Secondary | ICD-10-CM | POA: Diagnosis not present

## 2020-10-01 DIAGNOSIS — R531 Weakness: Secondary | ICD-10-CM | POA: Diagnosis not present

## 2020-10-01 DIAGNOSIS — G8194 Hemiplegia, unspecified affecting left nondominant side: Secondary | ICD-10-CM | POA: Diagnosis not present

## 2020-10-01 DIAGNOSIS — Z79899 Other long term (current) drug therapy: Secondary | ICD-10-CM | POA: Diagnosis not present

## 2020-10-01 DIAGNOSIS — M4722 Other spondylosis with radiculopathy, cervical region: Secondary | ICD-10-CM | POA: Diagnosis not present

## 2020-10-01 DIAGNOSIS — I6310 Cerebral infarction due to embolism of unspecified precerebral artery: Secondary | ICD-10-CM

## 2020-10-01 DIAGNOSIS — G8190 Hemiplegia, unspecified affecting unspecified side: Secondary | ICD-10-CM

## 2020-10-01 DIAGNOSIS — M5412 Radiculopathy, cervical region: Secondary | ICD-10-CM | POA: Diagnosis not present

## 2020-10-01 DIAGNOSIS — R29810 Facial weakness: Secondary | ICD-10-CM | POA: Diagnosis not present

## 2020-10-01 DIAGNOSIS — G459 Transient cerebral ischemic attack, unspecified: Secondary | ICD-10-CM | POA: Diagnosis not present

## 2020-10-01 DIAGNOSIS — R200 Anesthesia of skin: Secondary | ICD-10-CM | POA: Diagnosis not present

## 2020-10-01 DIAGNOSIS — M50121 Cervical disc disorder at C4-C5 level with radiculopathy: Secondary | ICD-10-CM | POA: Diagnosis not present

## 2020-10-01 DIAGNOSIS — M542 Cervicalgia: Secondary | ICD-10-CM | POA: Diagnosis not present

## 2020-10-01 DIAGNOSIS — R4781 Slurred speech: Secondary | ICD-10-CM | POA: Diagnosis not present

## 2020-10-01 LAB — GLUCOSE, CAPILLARY: Glucose-Capillary: 114 mg/dL — ABNORMAL HIGH (ref 70–99)

## 2020-10-01 LAB — LDL CHOLESTEROL, DIRECT: Direct LDL: 125.3 mg/dL — ABNORMAL HIGH (ref 0–99)

## 2020-10-01 LAB — HEMOGLOBIN A1C
Hgb A1c MFr Bld: 5.4 % (ref 4.8–5.6)
Mean Plasma Glucose: 108.28 mg/dL

## 2020-10-01 MED ORDER — GADOBUTROL 1 MMOL/ML IV SOLN
10.0000 mL | Freq: Once | INTRAVENOUS | Status: AC | PRN
  Administered 2020-10-01: 10 mL via INTRAVENOUS

## 2020-10-01 MED ORDER — FAMOTIDINE 20 MG PO TABS: 20.0000 mg | ORAL_TABLET | ORAL | Status: DC | PRN

## 2020-10-01 MED ORDER — LACTATED RINGERS IV SOLN: INTRAVENOUS | Status: DC

## 2020-10-01 MED ORDER — DOCUSATE SODIUM 100 MG PO CAPS: 100.0000 mg | ORAL_CAPSULE | Freq: Two times a day (BID) | ORAL | Status: DC | PRN

## 2020-10-01 MED ORDER — METHOCARBAMOL 1000 MG/10ML IJ SOLN
500.0000 mg | Freq: Four times a day (QID) | INTRAVENOUS | Status: DC | PRN
  Administered 2020-10-02 (×2): 500 mg via INTRAVENOUS
  Filled 2020-10-01 (×3): qty 5

## 2020-10-01 MED ORDER — STROKE: EARLY STAGES OF RECOVERY BOOK
Freq: Once | Status: AC
  Filled 2020-10-01: qty 1

## 2020-10-01 MED ORDER — PSYLLIUM 95 % PO PACK
1.0000 | PACK | Freq: Every day | ORAL | Status: DC
  Filled 2020-10-01 (×2): qty 1

## 2020-10-01 MED ORDER — METHYLCELLULOSE (LAXATIVE) PO POWD: 1.0000 | Freq: Every day | ORAL | Status: DC

## 2020-10-01 MED ORDER — MORPHINE SULFATE (PF) 2 MG/ML IV SOLN
1.0000 mg | INTRAVENOUS | Status: DC | PRN
  Administered 2020-10-01 – 2020-10-02 (×2): 2 mg via INTRAVENOUS
  Filled 2020-10-01 (×2): qty 1

## 2020-10-01 NOTE — Progress Notes (Signed)
Pt returned from MRI °

## 2020-10-01 NOTE — Progress Notes (Signed)
@  2125 Dr. Tollie Eth paged regarding: 1) pt passed swallow screen and needs diet order 2) pt requests PRN TUMS 3) pt requests PRN antiemetic due to hx of post-op nausea 4) MRI has resulted.  Page promptly returned. Items 1-3 above deferred to primary team, CVA work-up orders placed regarding item 4.   @2130  Dr. paged via answering service regarding items 1-3 above.  @2140  order received for Regular Diet. Will continue to monitor.

## 2020-10-01 NOTE — Progress Notes (Signed)
Pt to MRI with transporter. CCMD notified. Pt in no acute distress. Pt daughter remains in room per discussion with department leadership.

## 2020-10-01 NOTE — H&P (Signed)
Kristie Martinez is an 59 y.o. female.   Chief Complaint: Left-sided numbness and slight weakness of the face and arm while recovering from surgery this afternoon HPI: Patient is a 59 year old individual who had anterior cervical decompression arthrodesis at the surgical center.  She was waking up slowly and felt significant nausea and was given 2 doses of 7-1/2 mg IV of Phenergan.  She remained rather lethargic and somnolent and then the nurses noted that she had some left-sided weakness with a substantial facial droop and inability to lift the left arm.  EMS was summoned and she was advised to be transferred to [facility].  On my initial evaluation at approximately 1730 the patient was moving the right and left sides symmetrically though the left may have been slightly weaker she had some flattening of the left nasolabial fold.  She was however sluggish to respond and I agreed that she should be transferred for further observation evaluation.  Lower extremity strength appears symmetric.  I have advised that the patient should be observed here at [facility].  Neurology will become involved.  An MRI of the brain will be obtained.  Past Medical History:  Diagnosis Date  . Anemia   . Hamstring injury   . Hx of gastric bypass 2004  . Indigestion   . Obesity   . Post-operative nausea and vomiting   . Varicose veins     Past Surgical History:  Procedure Laterality Date  . CERVICAL DISC ARTHROPLASTY  2019  . ENDOMETRIAL ABLATION  2004  . ENDOVENOUS ABLATION SAPHENOUS VEIN W/ LASER  09-27-2012   left greater saphenous vein  by Gretta Began MD  . ENDOVENOUS ABLATION SAPHENOUS VEIN W/ LASER  11-08-2012   right greater saphenous vein   by Gretta Began MD  . mini gastric bypass   2004  . OTHER SURGICAL HISTORY  2004   mini gastric bypass  . TONSILLECTOMY    . TONSILLECTOMY AND ADENOIDECTOMY  1970    Family History  Problem Relation Age of Onset  . CAD Mother   . Heart attack Mother         died @ 84 of MI  . Hypertension Mother   . Hyperlipidemia Mother   . Diabetes Mother   . Heart disease Mother   . Heart attack Father        died @ 87 of MI  . Hypertension Father   . Sudden death Father   . CAD Brother        s/p CABG x 3 @ age 75  . Hypertension Brother   . Hyperlipidemia Brother   . Heart attack Brother   . Diabetes Maternal Grandmother   . Diabetes Maternal Grandfather   . COPD Paternal Grandmother   . Hypertension Brother   . Colon cancer Neg Hx    Social History:  reports that she has never smoked. She has never used smokeless tobacco. She reports current alcohol use. She reports that she does not use drugs.  Allergies: No Known Allergies  Facility-Administered Medications Prior to Admission  Medication Dose Route Frequency Provider Last Rate Last Admin  . 0.9 %  sodium chloride infusion  500 mL Intravenous Once Rachael Fee, MD       Medications Prior to Admission  Medication Sig Dispense Refill  . Biotin 78469 MCG TABS Take by mouth.    . calcium carbonate (TUMS - DOSED IN MG ELEMENTAL CALCIUM) 500 MG chewable tablet Chew 1 tablet by mouth  daily.    . cholecalciferol (VITAMIN D) 1000 units tablet Take 5,000 Units by mouth 4 (four) times daily.     . Cyanocobalamin (VITAMIN B-12 PO) Take 5,000 Units by mouth daily.     . diphenhydrAMINE (BENADRYL) 25 mg capsule Take 25 mg by mouth every 6 (six) hours as needed.    . docusate sodium (COLACE) 100 MG capsule Take 100 mg by mouth 2 (two) times daily as needed.     . famotidine (PEPCID) 20 MG tablet Take 20 mg by mouth as needed for heartburn or indigestion.    . indomethacin (INDOCIN) 25 MG capsule Take 25 mg by mouth 2 (two) times daily with a meal.    . Magnesium 400 MG TABS Take 1,200 mg by mouth at bedtime.    . metaxalone (SKELAXIN) 800 MG tablet Take 800 mg by mouth 2 (two) times daily.    . methylcellulose oral powder Take 1 packet by mouth daily.     . Multiple Vitamin (MULTIVITAMIN WITH  MINERALS) TABS tablet Take 1 tablet by mouth daily.    . phentermine 30 MG capsule Take 1 capsule (30 mg total) by mouth every morning. 30 capsule 0  . PRESCRIPTION MEDICATION Apply 1-2 Pump topically as needed. Cyclobenzaprine 2%/Diclofenac 3% cream - 3-4x daily PRN    . traZODone (DESYREL) 50 MG tablet Take 0.5-1 tablets (25-50 mg total) by mouth at bedtime as needed for sleep. 30 tablet 3  . vitamin k 100 MCG tablet Take 50 mcg by mouth daily.      No results found for this or any previous visit (from the past 48 hour(s)). No results found.  Review of Systems  Constitutional: Negative.   HENT: Negative.   Eyes: Negative.   Respiratory: Negative.   Cardiovascular: Negative.   Endocrine: Negative.   Genitourinary: Negative.   Musculoskeletal: Positive for myalgias and neck pain.  Neurological: Positive for numbness.  Hematological: Negative.     There were no vitals taken for this visit. Physical Exam Constitutional:      Comments: Somnolent but arouses easily  HENT:     Head: Normocephalic and atraumatic.     Right Ear: Tympanic membrane normal.     Left Ear: Tympanic membrane normal.     Nose: Nose normal.     Mouth/Throat:     Mouth: Mucous membranes are moist.  Eyes:     Conjunctiva/sclera: Conjunctivae normal.     Pupils: Pupils are equal, round, and reactive to light.  Neck:     Comments: Recent anterior cervical scar on the left base of neck. Cardiovascular:     Rate and Rhythm: Normal rate and regular rhythm.  Musculoskeletal:     Comments: Mild weakness of left deltoid and grip.  Slight left facial droop.  Eyes open symmetrically.  Pupils are 3 mm and equally reactive extraocular movements are full.  Skin:    General: Skin is warm and dry.     Capillary Refill: Capillary refill takes less than 2 seconds.  Neurological:     Comments: Patient is somnolent but arouses easily to loud voice.  She follows commands sluggishly.  Strength on the left side is slightly  diminished compared to the right side.  Face has a slight flattening of the left nasolabial fold.  Extraocular movements appear intact.  Tongue and uvula protrude in the midline.  Lower extremity strength appears symmetric.  Psychiatric:        Behavior: Behavior normal.  Assessment/Plan New onset left hemiparesis after anterior cervical surgery with a left-sided approach.  Incision is clean and dry and not problematic.  Plan observation in the hospital MRI scan we will ask neurology to see patient.  Stefani Dama, MD 10/01/2020, 6:10 PM

## 2020-10-01 NOTE — Progress Notes (Signed)
Pt received into room 4E17. CHG and skin assessment completed. Cardiac monitor applied and central telemetry notified. NIH scale completed and documented. Vitals taken and documented. Will continue to assess.   Estella Husk, RN

## 2020-10-01 NOTE — Progress Notes (Signed)
Soft collar applied per patient request for comfort.   Estella Husk, RN

## 2020-10-01 NOTE — Consult Note (Signed)
NEUROLOGY CONSULTATION NOTE   Date of service: October 01, 2020 Patient Name: Kristie Martinez MRN:  [id number] DOB:  03/15/1961 Reason for consult: "L sided weakness"  History of Present Illness  Kristie Martinez is a 59 y.o. right handed female with PMH significant for Anemia, obesity, cervical DJD who underwent cervical ACDF today. Post operatively, was noted to be slowly waking up, left sided weakness and mild left facial droop with left facial numbness. She was noted to have improvement in her symptoms per documentation. She was transferred to [facility] for observation overnight and we were asked to assist with evaluation.  Patient does not have any prior history of strokes. She does endorse having numbness in her left arm and difficulty opening jars.  She note numbness of her tongue and her left arm would fall to the bed after surgery today. She feels that her symptoms are significantly improved now but still has some persistent L facial numbness and mild drooping of the left angle of the mouth.  She denies any history of bell's palsy. Daughter at bedside reports that patient does have lazy eye on the left that sometimes becomes more prominent when she is tired.  NIHSS: 2 for facial droop and numbness of left lower face. LKW af 1400. MRS: 0 TPA: too mild, rapildy improving, recent spinal surgery Thrombectomy: mild symptoms that are improving and not significantly disabling and suspect that some of her symptoms were probably present from prior radiculopathy.   ROS   Constitutional Denies weight loss, fever and chills.  HEENT Denies changes in vision and hearing.  Respiratory Denies SOB and cough.  CV Denies palpitations and CP  GI Denies abdominal pain, + fpr nausea, no vomiting, no diarrhea.  GU Denies dysuria and urinary frequency.  MSK Denies myalgia and joint pain.  Skin Denies rash and pruritus.  Neurological Denies headache and syncope.  Psychiatric Denies recent changes in  mood. Denies anxiety and depression.   Past History   Past Medical History:  Diagnosis Date  . Anemia   . Hamstring injury   . Hx of gastric bypass 2004  . Indigestion   . Obesity   . Post-operative nausea and vomiting   . Varicose veins    Past Surgical History:  Procedure Laterality Date  . CERVICAL DISC ARTHROPLASTY  2019  . ENDOMETRIAL ABLATION  2004  . ENDOVENOUS ABLATION SAPHENOUS VEIN W/ LASER  09-27-2012   left greater saphenous vein  by Gretta Began MD  . ENDOVENOUS ABLATION SAPHENOUS VEIN W/ LASER  11-08-2012   right greater saphenous vein   by Gretta Began MD  . mini gastric bypass   2004  . OTHER SURGICAL HISTORY  2004   mini gastric bypass  . TONSILLECTOMY    . TONSILLECTOMY AND ADENOIDECTOMY  1970   Family History  Problem Relation Age of Onset  . CAD Mother   . Heart attack Mother        died @ 18 of MI  . Hypertension Mother   . Hyperlipidemia Mother   . Diabetes Mother   . Heart disease Mother   . Heart attack Father        died @ 58 of MI  . Hypertension Father   . Sudden death Father   . CAD Brother        s/p CABG x 3 @ age 64  . Hypertension Brother   . Hyperlipidemia Brother   . Heart attack Brother   . Diabetes Maternal Grandmother   .  Diabetes Maternal Grandfather   . COPD Paternal Grandmother   . Hypertension Brother   . Colon cancer Neg Hx    Social History   Socioeconomic History  . Marital status: Married    Spouse name: Not on file  . Number of children: Not on file  . Years of education: Not on file  . Highest education level: Not on file  Occupational History  . Occupation: Production designer, theatre/television/film for [employer]  Tobacco Use  . Smoking status: Never Smoker  . Smokeless tobacco: Never Used  Vaping Use  . Vaping Use: Never used  Substance and Sexual Activity  . Alcohol use: Yes    Comment: occasional glass of wine.  . Drug use: No  . Sexual activity: Not on file  Other Topics Concern  . Not on file  Social History  Narrative   Lives in [place] with husband.  Works @ [employer].  Finished PhD in 2016.  Exercises regularly.   Social Determinants of Health   Financial Resource Strain:   . Difficulty of Paying Living Expenses: Not on file  Food Insecurity:   . Worried About Programme researcher, broadcasting/film/video in the Last Year: Not on file  . Ran Out of Food in the Last Year: Not on file  Transportation Needs:   . Lack of Transportation (Medical): Not on file  . Lack of Transportation (Non-Medical): Not on file  Physical Activity:   . Days of Exercise per Week: Not on file  . Minutes of Exercise per Session: Not on file  Stress:   . Feeling of Stress : Not on file  Social Connections:   . Frequency of Communication with Friends and Family: Not on file  . Frequency of Social Gatherings with Friends and Family: Not on file  . Attends Religious Services: Not on file  . Active Member of Clubs or Organizations: Not on file  . Attends Banker Meetings: Not on file  . Marital Status: Not on file   No Known Allergies  Medications   Facility-Administered Medications Prior to Admission  Medication Dose Route Frequency Provider Last Rate Last Admin  . 0.9 %  sodium chloride infusion  500 mL Intravenous Once Rachael Fee, MD       Medications Prior to Admission  Medication Sig Dispense Refill Last Dose  . Biotin 78242 MCG TABS Take by mouth.     . calcium carbonate (TUMS - DOSED IN MG ELEMENTAL CALCIUM) 500 MG chewable tablet Chew 1 tablet by mouth daily.     . cholecalciferol (VITAMIN D) 1000 units tablet Take 5,000 Units by mouth 4 (four) times daily.      . Cyanocobalamin (VITAMIN B-12 PO) Take 5,000 Units by mouth daily.      . diphenhydrAMINE (BENADRYL) 25 mg capsule Take 25 mg by mouth every 6 (six) hours as needed.     . docusate sodium (COLACE) 100 MG capsule Take 100 mg by mouth 2 (two) times daily as needed.      . famotidine (PEPCID) 20 MG tablet Take 20 mg by mouth as needed for heartburn or  indigestion.     . indomethacin (INDOCIN) 25 MG capsule Take 25 mg by mouth 2 (two) times daily with a meal.     . Magnesium 400 MG TABS Take 1,200 mg by mouth at bedtime.     . metaxalone (SKELAXIN) 800 MG tablet Take 800 mg by mouth 2 (two) times daily.     . methylcellulose  oral powder Take 1 packet by mouth daily.      . Multiple Vitamin (MULTIVITAMIN WITH MINERALS) TABS tablet Take 1 tablet by mouth daily.     . phentermine 30 MG capsule Take 1 capsule (30 mg total) by mouth every morning. 30 capsule 0   . PRESCRIPTION MEDICATION Apply 1-2 Pump topically as needed. Cyclobenzaprine 2%/Diclofenac 3% cream - 3-4x daily PRN     . traZODone (DESYREL) 50 MG tablet Take 0.5-1 tablets (25-50 mg total) by mouth at bedtime as needed for sleep. 30 tablet 3   . vitamin k 100 MCG tablet Take 50 mcg by mouth daily.        Vitals   Vitals:   10/01/20 1816 10/01/20 1945  BP: 136/88 118/77  Pulse: 93 89  Resp: 19 16  Temp: 98.3 F (36.8 C) 98.1 F (36.7 C)  TempSrc: Oral Oral  SpO2: 98% 99%  Weight: 100.1 kg   Height: 5\' 6"  (1.676 m)      Body mass index is 35.62 kg/m.  Physical Exam   General: Laying comfortably in bed; in no acute distress HENT: Normal oropharynx and mucosa. Normal external appearance of ears and nose. Neck: Supple, no pain or tenderness CV: No JVD. No peripheral edema. Pulmonary: Symmetric Chest rise. Normal respiratory effort. Abdomen: Soft to touch, non-tender. Ext: No cyanosis, edema, or deformity Skin: No rash. Normal palpation of skin.  Musculoskeletal: Normal digits and nails by inspection. No clubbing.  Neurologic Examination  Mental status/Cognition: Alert, oriented to self, place, month and year, good attention. Speech/language: Fluent, comprehension intact, object naming intact, repetition intact. Cranial nerves:   CN II Pupils equal and reactive to light, no VF deficits   CN III,IV,VI EOM intact, no gaze preference or deviation, no nystagmus   CN V  Decreased to pin prick in V2,3 on the left to 75% percent compared to right.   CN VII Very mild asymmetry of the left nasolabial fold.   CN VIII normal hearing to speech   CN IX & X normal palatal elevation, no uvular deviation   CN XI 5/5 head turn and 5/5 shoulder shrug bilaterally   CN XII midline tongue protrusion   Motor:  Muscle bulk: normal, tone normal, pronator drift none tremor none Mvmt Root Nerve  Muscle Right Left Comments  SA C5/6 Ax Deltoid 5 5   EF C5/6 Mc Biceps 5 5   EE C6/7/8 Rad Triceps 5 5   WF C6/7 Med FCR 5 5   WE C7/8 PIN ECU 5 5   F Ab C8/T1 U ADM/FDI 5 4+   HF L1/2/3 Fem Illopsoas 4+ 5   KE L2/3/4 Fem Quad 5 5   KF L2/3/4 Sciat Hamstrings 5 4+   DF L4/5 D Peron Tib Ant 5 5   PF S1/2 Tibial Grc/Sol 5 5    Reflexes:  Right Left Comments  Pectoralis      Biceps (C5/6) 2+ 2+   Brachioradialis (C5/6) 2+ 2+    Triceps (C6/7) 2+ 2+    Patellar (L3/4) 3 3 Cross adductors +   Achilles (S1) 2 2    Hoffman + +    Plantar     Jaw jerk    Sensation:  Light touch Decreased to 75% in L face, otherwise intact throughout   Pin prick Decreased to 75% in L face, otherwise intact throughout   Temperature    Vibration   Proprioception    Coordination/Complex Motor:  - Finger to Nose  intact BL - Heel to shin intact BL - Rapid alternating movement normal - Gait: Did not assess due to recent surgery with spine precautions.  Labs   CBC: No results for input(s): WBC, NEUTROABS, HGB, HCT, MCV, PLT in the last 168 hours.  Basic Metabolic Panel:  Lab Results  Component Value Date   NA 142 05/12/2020   K 4.7 05/12/2020   CO2 28 05/12/2020   GLUCOSE 82 05/12/2020   BUN 11 05/12/2020   CREATININE 0.70 05/12/2020   CALCIUM 9.4 05/12/2020   GFRNONAA 96 05/12/2020   GFRAA 111 05/12/2020   Lipid Panel:  Lab Results  Component Value Date   LDLCALC 98 05/12/2020   HgbA1c:  Lab Results  Component Value Date   HGBA1C 5.3 05/12/2020   Urine Drug Screen: No  results found for: LABOPIA, COCAINSCRNUR, LABBENZ, AMPHETMU, THCU, LABBARB  Alcohol Level [facility] results found for: [facility]  MRI C spine without contrast: IMPRESSION: 1. Prior ACDF at C5-C7 with solid arthrodesis. 2. 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 Query left C5 radiculitis.  MRI Brain is pending.  Impression   Kristie JewsDenise C Martinez is a 59 y.o. female with PMH significant for Anemia, obesity, cervical DJD who underwent cervical ACDF today. Post operatively, was noted to be slowly waking up, left sided weakness and mild left facial droop with left facial numbness with improvement in her symptoms. Her neurologic examination is notable for mild drooping of the left angle of her mouth, left lower facial numbness and mild decrease in grip strength in L hand and with L knee flexion. Althou some of her symptoms including facial numbness could be explained by a potential lesion in the cervical spinal cord, spinal cord lesion is unlikely to cause a facial droop. She does not have a history of bells palsy that can sometimes have recrudscence in the setting of a stressor.  Her symptoms at this time are mild and not debilitating with a NIHSS of 2 for Left facial droop and sensory deficit in left face.  Recommendations   - I ordered Q2 hours NIHSS - Agree with brain imaging with MRI Brain without contrast - Antithrombotic - discussed with Neurosurgery team on secure chat, advised no aspirin at this time. - Recommend DVT ppx if okay with NSGY team. - SBP goal - discussed with NSGY team over secure chat and recommended SBP < 160 - Recommend Telemetry monitoring for arrythmia - Recommend bedside swallow screen prior to PO intake. - May neeed full stroke evaluation with vessel imaging, TTE, LDL, HbA1c, PT, OT evaluation pending MRI Brain. - Discussed with patient that she should  let us know if there is any worsening of her symptoms. ______________________________________________________________________   Thank you for the opportunity to take part in the care of this patient. If you have any further questions, please contact the neurology consultation attending.  Signed,  Erick BlinksSalman Lonnel Gjerde [facility] Pager Number [phone]

## 2020-10-02 ENCOUNTER — Observation Stay (HOSPITAL_COMMUNITY): Payer: 59

## 2020-10-02 ENCOUNTER — Other Ambulatory Visit (HOSPITAL_COMMUNITY): Payer: Self-pay | Admitting: Nurse Practitioner

## 2020-10-02 ENCOUNTER — Observation Stay (HOSPITAL_BASED_OUTPATIENT_CLINIC_OR_DEPARTMENT_OTHER): Payer: 59

## 2020-10-02 DIAGNOSIS — I6389 Other cerebral infarction: Secondary | ICD-10-CM | POA: Diagnosis not present

## 2020-10-02 DIAGNOSIS — I639 Cerebral infarction, unspecified: Secondary | ICD-10-CM | POA: Diagnosis not present

## 2020-10-02 DIAGNOSIS — R29818 Other symptoms and signs involving the nervous system: Secondary | ICD-10-CM | POA: Diagnosis not present

## 2020-10-02 DIAGNOSIS — Z79899 Other long term (current) drug therapy: Secondary | ICD-10-CM | POA: Diagnosis not present

## 2020-10-02 DIAGNOSIS — Z981 Arthrodesis status: Secondary | ICD-10-CM | POA: Diagnosis not present

## 2020-10-02 DIAGNOSIS — G459 Transient cerebral ischemic attack, unspecified: Secondary | ICD-10-CM | POA: Diagnosis not present

## 2020-10-02 DIAGNOSIS — R29810 Facial weakness: Secondary | ICD-10-CM | POA: Diagnosis not present

## 2020-10-02 DIAGNOSIS — G8194 Hemiplegia, unspecified affecting left nondominant side: Secondary | ICD-10-CM | POA: Diagnosis not present

## 2020-10-02 LAB — ECHOCARDIOGRAM COMPLETE BUBBLE STUDY
Area-P 1/2: 6.71 cm2
S' Lateral: 2.7 cm

## 2020-10-02 MED ORDER — ASPIRIN EC 81 MG PO TBEC
81.0000 mg | DELAYED_RELEASE_TABLET | Freq: Every day | ORAL | Status: DC
  Administered 2020-10-02: 81 mg via ORAL
  Filled 2020-10-02: qty 1

## 2020-10-02 MED ORDER — ATORVASTATIN CALCIUM 40 MG PO TABS
40.0000 mg | ORAL_TABLET | Freq: Every day | ORAL | Status: DC
  Administered 2020-10-02: 40 mg via ORAL
  Filled 2020-10-02: qty 1

## 2020-10-02 MED ORDER — OXYCODONE-ACETAMINOPHEN 5-325 MG PO TABS
1.0000 | ORAL_TABLET | ORAL | Status: DC | PRN
  Administered 2020-10-02: 2 via ORAL
  Filled 2020-10-02: qty 2

## 2020-10-02 MED ORDER — DIAZEPAM 5 MG PO TABS: 5.0000 mg | ORAL_TABLET | Freq: Four times a day (QID) | ORAL | 0 refills | Status: DC | PRN

## 2020-10-02 MED ORDER — OXYCODONE-ACETAMINOPHEN 5-325 MG PO TABS
1.0000 | ORAL_TABLET | ORAL | 0 refills | Status: DC | PRN
Start: 2020-10-02 — End: 2020-11-12

## 2020-10-02 MED ORDER — ONDANSETRON HCL 4 MG PO TABS: 4.0000 mg | ORAL_TABLET | Freq: Three times a day (TID) | ORAL | 0 refills | Status: DC | PRN

## 2020-10-02 MED ORDER — ASPIRIN 81 MG PO TBEC: 81.0000 mg | DELAYED_RELEASE_TABLET | Freq: Every day | ORAL | 11 refills | Status: DC

## 2020-10-02 MED ORDER — ATORVASTATIN CALCIUM 40 MG PO TABS: 40.0000 mg | ORAL_TABLET | Freq: Every day | ORAL | 2 refills | Status: DC

## 2020-10-02 MED FILL — OXYCODONE-APAP 5-325MG: 5-325 | 4 days supply | Qty: 20 | Fill #0

## 2020-10-02 MED FILL — ATORVASTATIN 40 MG TABLET: 40 | 30 days supply | Qty: 30 | Fill #0

## 2020-10-02 MED FILL — diazePAM 5 MG TABS: 5 | 5 days supply | Qty: 20 | Fill #0

## 2020-10-02 MED FILL — ONDANSETRON HCL 4 MG TABLET: 4 | 7 days supply | Qty: 20 | Fill #0

## 2020-10-02 NOTE — Progress Notes (Signed)
Patient ID: Kristie Martinez, female   DOB: 09-16-1961, 59 y.o.   MRN: [id number] Patient's vital signs are stable and postoperative symptoms have completely resolved.  Face is completely symmetric speech is normal no weakness in either upper extremity.  Review of MRI suggests very subtle diffusion change in the right parietal region.  Current work-up includes echo with bubble study.  Appreciate help and evaluation of neurology service.  I plan to discharge the patient today after her studies are completed.  We will send her home on a small dose of Percocet and Valium for neck and shoulder pain and also some Zofran should nausea become a problem.  Follow-up in the office in 2 weeks time.  Soft collar is for comfort measures only.

## 2020-10-02 NOTE — Progress Notes (Signed)
Echocardiogram 2D Echocardiogram has been performed.  Leta Jungling M 10/02/2020, 8:31 AM

## 2020-10-02 NOTE — Progress Notes (Signed)
Discharge orders received, IV and telemetry removed, CCMD notified.  Discharge instructions reviewed with patient and she expressed understanding. Her ride is outside and she desired to ambulate to main entrance.  No complaints of weakness.  Gait is steady.

## 2020-10-02 NOTE — Discharge Summary (Signed)
Physician Discharge Summary  Patient ID: Kristie Martinez MRN: [id number] DOB/AGE: May 28, 1961 59 y.o.  Admit date: 10/01/2020 Discharge date: 10/02/2020  Admission Diagnoses: Cervical radiculopathy C4-5 status post anterior cervical decompression and arthrodesis.  Postoperative transient hemiparesis on the left.  Discharge Diagnoses: Postoperative transient hemiparesis on the left, transient ischemic attack.  Status post anterior cervical decompression and fusion C4-C5 Active Problems:   Hemiparesis [facility])   Discharged Condition: good  Hospital Course: Patient was admitted to [facility] after undergoing anterior decompression arthrodesis as an outpatient at the surgical center.  Kristie Martinez initially had considerable nausea upon awakening was given some antinausea medication.  Then rather suddenly was apparent that Kristie Martinez had some left upper extremity weakness and left facial droop this went on for several minutes but soon started to show signs of recovery.  Kristie Martinez was transferred to [facility] for further evaluation of this process.  Neurology consultation was obtained.  Upon initial evaluation Kristie Martinez was largely resolved however an MRI demonstrated a very subtle diffusion process in the right parietal region.  Kristie Martinez has been maintained on IV hydration and is undergoing some further work-up at this time including echo cardiography.  Kristie Martinez will be discharged today for further follow-up as an outpatient.  Consults: neurology  Significant Diagnostic Studies: MRI brain demonstrating mild diffusion findings in the right parietal region.  Treatments: IV hydration  Discharge Exam: Blood pressure (!) 106/58, pulse 87, temperature 97.6 F (36.4 C), temperature source Oral, resp. rate 13, height 5\' 6"  (1.676 m), weight 100.1 kg, SpO2 99 %. Incision on the neck is clean and dry motor function appears intact in the upper extremities face is symmetric tongue and uvula protrude in the midline lower extremity  strength is normal.  Disposition: Discharge disposition: 01-Home or Self Care       Discharge Instructions    Call MD for:  redness, tenderness, or signs of infection (pain, swelling, redness, odor or green/yellow discharge around incision site)   Complete by: As directed    Call MD for:  severe uncontrolled pain   Complete by: As directed    Call MD for:  temperature >100.4   Complete by: As directed    Diet - low sodium heart healthy   Complete by: As directed    Diet general   Complete by: As directed    Discharge wound care:   Complete by: As directed    Okay to shower. Do not apply salves or appointments to incision. No heavy lifting with the upper extremities greater than 10 pounds. May resume driving when not requiring pain medication and patient feels comfortable with doing so.   Increase activity slowly   Complete by: As directed      Allergies as of 10/02/2020   No Known Allergies     Medication List    TAKE these medications   Biotin 13/10/2020 MCG Tabs Take by mouth.   calcium carbonate 500 MG chewable tablet Commonly known as: TUMS - dosed in mg elemental calcium Chew 1 tablet by mouth daily.   cholecalciferol 1000 units tablet Commonly known as: VITAMIN D Take 5,000 Units by mouth 4 (four) times daily.   diazepam 5 MG tablet Commonly known as: Valium Take 1 tablet (5 mg total) by mouth every 6 (six) hours as needed for muscle spasms.   diphenhydrAMINE 25 mg capsule Commonly known as: BENADRYL Take 25 mg by mouth every 6 (six) hours as needed.   docusate sodium 100 MG capsule Commonly known  as: COLACE Take 100 mg by mouth 2 (two) times daily as needed.   famotidine 20 MG tablet Commonly known as: PEPCID Take 20 mg by mouth as needed for heartburn or indigestion.   indomethacin 25 MG capsule Commonly known as: INDOCIN Take 25 mg by mouth 2 (two) times daily with a meal.   Magnesium 400 MG Tabs Take 1,200 mg by mouth at bedtime.   metaxalone  800 MG tablet Commonly known as: SKELAXIN Take 800 mg by mouth 2 (two) times daily.   methylcellulose oral powder Take 1 packet by mouth daily.   multivitamin with minerals Tabs tablet Take 1 tablet by mouth daily.   ondansetron 4 MG tablet Commonly known as: Zofran Take 1 tablet (4 mg total) by mouth every 8 (eight) hours as needed for nausea or vomiting.   oxyCODONE-acetaminophen 5-325 MG tablet Commonly known as: Percocet Take 1 tablet by mouth every 4 (four) hours as needed for severe pain.   phentermine 30 MG capsule Take 1 capsule (30 mg total) by mouth every morning.   PRESCRIPTION MEDICATION Apply 1-2 Pump topically as needed. Cyclobenzaprine 2%/Diclofenac 3% cream - 3-4x daily PRN   traZODone 50 MG tablet Commonly known as: DESYREL Take 0.5-1 tablets (25-50 mg total) by mouth at bedtime as needed for sleep.   VITAMIN B-12 PO Take 5,000 Units by mouth daily.   vitamin k 100 MCG tablet Take 50 mcg by mouth daily.            Discharge Care Instructions  (From admission, onward)         Start     Ordered   10/02/20 0000  Discharge wound care:       Comments: Okay to shower. Do not apply salves or appointments to incision. No heavy lifting with the upper extremities greater than 10 pounds. May resume driving when not requiring pain medication and patient feels comfortable with doing so.   10/02/20 0758           Signed: Shary Key Alexis Reber 10/02/2020, 7:59 AM

## 2020-10-02 NOTE — Progress Notes (Signed)
Carotid artery duplex completed. Refer to "CV Proc" under chart review to view preliminary results.  10/02/2020 11:30 AM Eula Fried., MHA, RVT, RDCS, RDMS

## 2020-10-02 NOTE — Progress Notes (Signed)
STROKE TEAM PROGRESS NOTE   INTERVAL HISTORY Patient's symptoms resolved except for slight numbness left side of nose.   Vitals:   10/01/20 1816 10/01/20 1945 10/01/20 2357 10/02/20 0500  BP: 136/88 118/77 120/83 (!) 106/58  Pulse: 93 89  87  Resp: Temp: 98.3 F (36.8 C) 98.1 F (36.7 C) 98.6 F (37 C) 97.6 F (36.4 C)  TempSrc: Oral Oral Oral Oral  SpO2: 98% 99% 99% 99%  Weight: 100.1 kg     Height:  (1.676 m)      CBC: No results for input(s): WBC, NEUTROABS, HGB, HCT, MCV, PLT in the last 168 hours. Basic Metabolic Panel: No results for input(s): NA, K, CL, CO2, GLUCOSE, BUN, CREATININE, CALCIUM, MG, PHOS in the last 168 hours.   Lab Results  Component Value Date   LDLCALC 98 05/12/2020  LDL Direct 125.3   HgbA1c:  Recent Labs  Lab 10/01/20 2151  HGBA1C 5.4   Urine Drug Screen: No results for input(s): LABOPIA, COCAINSCRNUR, LABBENZ, AMPHETMU, THCU, LABBARB in the last 168 hours.  Alcohol Level No results for input(s): ETH in the last 168 hours.  IMAGING past 24 hours MR ANGIO HEAD WO CONTRAST  Result Date: 10/02/2020 CLINICAL DATA:  Acute neuro deficit. Left arm weakness. Facial droop. EXAM: MRA HEAD WITHOUT CONTRAST TECHNIQUE: Angiographic images of the Circle of Willis were obtained using MRA technique without intravenous contrast. COMPARISON:  MRI head 10/01/2020 FINDINGS: Both vertebral arteries widely patent. PICA patent bilaterally. Basilar widely patent. AICA, superior cerebellar, and posterior cerebral arteries patent without stenosis or vascular malformation. Fetal origin right posterior cerebral artery. Internal carotid artery widely patent bilaterally. Anterior and middle cerebral arteries patent bilaterally without stenosis or occlusion. No vascular malformation. IMPRESSION: Negative MRA head Electronically Signed   By: Marlan Palau M.D.   On: 10/02/2020 09:13   MR BRAIN W WO CONTRAST  Result Date: 10/01/2020 CLINICAL DATA:   59 year old female with left side numbness and weakness in the face and arm while recovering from cervical ACDF today. EXAM: MRI HEAD WITHOUT AND WITH CONTRAST TECHNIQUE: Multiplanar, multiecho pulse sequences of the brain and surrounding structures were obtained without and with intravenous contrast. CONTRAST:  10mL GADAVIST GADOBUTROL 1 MMOL/ML IV SOLN COMPARISON:  CT head 01/09/2020.  Cervical spine MRI 04/24/2020. FINDINGS: Brain: Questionable subtle and punctate foci of diffusion restriction along the lateral aspect of the right parietal lobe cortex and subcortical white matter on series 5, images 78 and 81 (more convincing on ADC series 6, images 28 and 31). However, no associated T2 or FLAIR hyperintensity. Otherwise no convincing restricted diffusion. No midline shift, mass effect, evidence of mass lesion, ventriculomegaly, extra-axial collection or acute intracranial hemorrhage. Cervicomedullary junction and pituitary are within normal limits. Wallace Cullens and white matter signal is within normal limits for age throughout the brain.; Minimal nonspecific white matter T2 and FLAIR hyperintensity such as on series 11, image 17 in the right frontal lobe. No cortical encephalomalacia or chronic cerebral blood products. No abnormal enhancement identified.  No dural thickening. Vascular: Major intracranial vascular flow voids are preserved. The major dural venous sinuses are enhancing and appear to be patent. Skull and upper cervical spine: Negative visible upper cervical spine. Normal bone marrow signal. Sinuses/Orbits: Negative. Other: Grossly normal visible internal auditory structures. Visible scalp and face appear negative. IMPRESSION: 1. Questionable two punctate foci of restricted diffusion in the right lateral parietal lobe cortex and subcortical white matter. But these could be artifact. No  hemorrhage or mass effect. 2. Otherwise normal for age MRI appearance of the brain. Electronically Signed   By: Odessa Fleming M.D.    On: 10/01/2020 21:15   ECHOCARDIOGRAM COMPLETE BUBBLE STUDY  Result Date: 10/02/2020    ECHOCARDIOGRAM REPORT   Patient Name:   TERITA HEJL Dizon Date of Exam: 10/02/2020 Medical Rec #:  [id number]     Height:       66.0 in Accession #:    [id number]    Weight:       220.7 lb Date of Birth:  03-17-1961     BSA:          2.085 m Patient Age:    59 years      BP:           106/59 mmHg Patient Gender: F             HR:           80 bpm. Exam Location:  Inpatient Procedure: 2D Echo, Color Doppler and Pediatric Echo  History:        Patient has no prior history of Echocardiogram examinations.  Sonographer:    NA Referring Phys: [id number] [facility] IMPRESSIONS  1. Left ventricular ejection fraction, by estimation, is 60 to 65%. The left ventricle has normal function. The left ventricle has no regional wall motion abnormalities. Left ventricular diastolic parameters are consistent with Grade I diastolic dysfunction (impaired relaxation).  2. Right ventricular systolic function is normal. The right ventricular size is normal. Tricuspid regurgitation signal is inadequate for assessing PA pressure.  3. The mitral valve is normal in structure. No evidence of mitral valve regurgitation. No evidence of mitral stenosis.  4. The aortic valve is tricuspid. Aortic valve regurgitation is not visualized. No aortic stenosis is present.  5. The inferior vena cava is normal in size with greater than 50% respiratory variability, suggesting right atrial pressure of 3 mmHg.  6. Very difficult bubble study images but appear negative. FINDINGS  Left Ventricle: Left ventricular ejection fraction, by estimation, is 60 to 65%. The left ventricle has normal function. The left ventricle has no regional wall motion abnormalities. The left ventricular internal cavity size was normal in size. There is  no left ventricular hypertrophy. Left ventricular diastolic parameters are consistent with Grade I diastolic dysfunction (impaired  relaxation). Right Ventricle: The right ventricular size is normal. No increase in right ventricular wall thickness. Right ventricular systolic function is normal. Tricuspid regurgitation signal is inadequate for assessing PA pressure. Left Atrium: Left atrial size was normal in size. Right Atrium: Right atrial size was normal in size. Pericardium: There is no evidence of pericardial effusion. Mitral Valve: The mitral valve is normal in structure. No evidence of mitral valve regurgitation. No evidence of mitral valve stenosis. Tricuspid Valve: The tricuspid valve is normal in structure. Tricuspid valve regurgitation is not demonstrated. Aortic Valve: The aortic valve is tricuspid. Aortic valve regurgitation is not visualized. No aortic stenosis is present. Pulmonic Valve: The pulmonic valve was normal in structure. Pulmonic valve regurgitation is not visualized. Aorta: The aortic root is normal in size and structure. Venous: The inferior vena cava is normal in size with greater than 50% respiratory variability, suggesting right atrial pressure of 3 mmHg. IAS/Shunts: Very difficult bubble study images but appear negative.  LEFT VENTRICLE PLAX 2D LVIDd:         4.00 cm  Diastology LVIDs:         2.70 cm  LV e'  medial:    0.06 cm/s LV PW:         1.00 cm  LV E/e' medial:  7.9 LV IVS:        1.20 cm  LV e' lateral:   0.06 cm/s LVOT diam:     2.30 cm  LV E/e' lateral: 9.0 LV SV:         31 LV SV Index:   15 LVOT Area:     4.15 cm  LEFT ATRIUM           Index LA diam:      3.40 cm 1.63 cm/m LA Vol (A2C): 29.9 ml 14.34 ml/m LA Vol (A4C): 59.3 ml 28.44 ml/m  AORTIC VALVE LVOT Vmax:   55.50 cm/s LVOT Vmean:  35.900 cm/s LVOT VTI:    0.075 m  AORTA Ao Root diam: 3.20 cm Ao Asc diam:  3.10 cm MITRAL VALVE MV Area (PHT): 6.71 cm    SHUNTS MV Decel Time: 113 msec    Systemic VTI:  0.08 m MV E velocity: 0.50 cm/s   Systemic Diam: 2.30 cm MV A velocity: 67.30 cm/s MV E/A ratio:  0.01 Marca Ancona MD Electronically signed by  Marca Ancona MD Signature Date/Time: 10/02/2020/8:22:57 AM    Final    VAS US CAROTID  Result Date: 10/02/2020 Carotid Arterial Duplex Study Indications:       Tiny strokes left MCA distribution. Comparison Study:  No prior study Performing Technologist: Gertie Fey MHA, RDMS, RVT, RDCS  Examination Guidelines: A complete evaluation includes B-mode imaging, spectral Doppler, color Doppler, and power Doppler as needed of all accessible portions of each vessel. Bilateral testing is considered an integral part of a complete examination. Limited examinations for reoccurring indications may be performed as noted.  Right Carotid Findings: +----------+--------+--------+--------+----------------------+--------+           PSV cm/sEDV cm/sStenosisPlaque Description    Comments +----------+--------+--------+--------+----------------------+--------+ CCA Prox  114     20                                             +----------+--------+--------+--------+----------------------+--------+ CCA Distal90      22              smooth and homogeneous         +----------+--------+--------+--------+----------------------+--------+ ICA Prox  67      17                                             +----------+--------+--------+--------+----------------------+--------+ ICA Distal96      33                                             +----------+--------+--------+--------+----------------------+--------+ ECA       75      15                                             +----------+--------+--------+--------+----------------------+--------+ +----------+--------+-------+----------------+-------------------+           PSV cm/sEDV cmsDescribe  Arm Pressure (mmHG) +----------+--------+-------+----------------+-------------------+ [id number]            Multiphasic, WNL                    +----------+--------+-------+----------------+-------------------+  +---------+--------+--+--------+--+---------+ VertebralPSV cm/s63EDV cm/s20Antegrade +---------+--------+--+--------+--+---------+  Left Carotid Findings: +----------+--------+--------+--------+----------------------+--------+           PSV cm/sEDV cm/sStenosisPlaque Description    Comments +----------+--------+--------+--------+----------------------+--------+ CCA Prox  162     35                                             +----------+--------+--------+--------+----------------------+--------+ CCA Distal114     28                                             +----------+--------+--------+--------+----------------------+--------+ ICA Prox  81      23                                             +----------+--------+--------+--------+----------------------+--------+ ICA Distal94      40              smooth and homogeneous         +----------+--------+--------+--------+----------------------+--------+ ECA       107     16                                             +----------+--------+--------+--------+----------------------+--------+ +----------+--------+--------+----------------+-------------------+           PSV cm/sEDV cm/sDescribe        Arm Pressure (mmHG) +----------+--------+--------+----------------+-------------------+ Subclavian209             Multiphasic, WNL                    +----------+--------+--------+----------------+-------------------+ +---------+--------+--+--------+--+---------+ VertebralPSV cm/s60EDV cm/s20Antegrade +---------+--------+--+--------+--+---------+   Summary: Right Carotid: Velocities in the right ICA are consistent with a 1-39% stenosis. Left Carotid: Velocities in the left ICA are consistent with a 1-39% stenosis. Vertebrals:  Bilateral vertebral arteries demonstrate antegrade flow. Subclavians: Normal flow hemodynamics were seen in bilateral subclavian              arteries. *See table(s) above for measurements  and observations.     Preliminary     PHYSICAL EXAM  Exam: NAD, pleasant                  Speech:    Speech is normal; fluent and spontaneous with normal comprehension.  Cognition:    The patient is oriented to person, place, and time;     recent and remote memory intact;     language fluent;    Cranial Nerves:    The pupils are equal, round, and reactive to light.Trigeminal sensation is impaired left V2. The face is symmetric. The palate elevates in the midline. Hearing intact. Voice is normal. Shoulder shrug is normal. The tongue has normal motion without fasciculations.   Coordination:  No dysmetria  Motor Observation:    No asymmetry, no atrophy, and no involuntary movements noted. Tone:  Normal muscle tone.     Strength: Mild prox left UE weakness otherwise strength is V/V in the upper and lower limbs.      Sensation: intact LT     ASSESSMENT/PLAN Ms. Carlean JewsDenise C Mazur is a 59 y.o. female with history of anemia, obesity, cervical DJD who underwent cervical ACDF 11/11 who postop developed LUE weakness and face/nose numbness.   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 O/w normal  MRA  Unremarkable   Carotid Doppler  B ICA 1-39% stenosis, VAs antegrade   2D Echo EF 60-65%. No source of embolus, difficult bubble but appears normal   LDL Direct LDL 125.3  HgbA1c 5.4  VTE prophylaxis - SCDs   No antithrombotic prior to admission, now on No antithrombotic. Add aspirin 81 mg daily. Dr. Danielle DessElsner agreeable  Therapy recommendations:  No therapy needs  Disposition:  Return home  Follow up with Dr. Pearlean BrownieSethi in 4-6 weeks  Hyperlipidemia  Home meds:  No statin  LDL 125.3, goal < 70  Add lipitor 40   Continue statin at discharge  Other Stroke Risk Factors  ETOH use,  advised to drink no more than 1 drink(s) a day  Obesity, Body  mass index is 35.62 kg/m., BMI >/= 30 associated with increased stroke risk, recommend weight loss, diet and exercise as appropriate   Hospital day # 0  Personally examined patient and images, and have participated in and made any corrections needed to history, physical, neuro exam,assessment and plan as stated above.  I have personally obtained the history, evaluated lab date, reviewed imaging studies and agree with radiology interpretations.    Naomie DeanAntonia Ashia Dehner, MD Stroke Neurology  I spent 35 minutes of face-to-face and non-face-to-face time with patient. This included prechart review, lab review, study review, order entry, electronic health record documentation, patient education on the different diagnostic and therapeutic options, counseling and coordination of care, risks and benefits of management, compliance, or risk factor reduction   To contact Stroke Continuity provider, please refer to [email or web address]. After hours, contact General Neurology

## 2020-10-03 ENCOUNTER — Encounter: Payer: Self-pay | Admitting: Nurse Practitioner

## 2020-10-06 MED FILL — METHOCARBAMOL 750 MG TABS: 750 | 30 days supply | Qty: 90 | Fill #1

## 2020-10-06 MED FILL — METAXALONE 800 MG TABLET: 800 | 30 days supply | Qty: 90 | Fill #5

## 2020-10-06 MED FILL — INDOMETHACIN 50 MG CAPSULE: 50 | 30 days supply | Qty: 90 | Fill #5

## 2020-10-07 ENCOUNTER — Telehealth: Payer: Self-pay | Admitting: Emergency Medicine

## 2020-10-07 NOTE — Telephone Encounter (Signed)
Left message for patient to return call for scheduling.

## 2020-10-19 ENCOUNTER — Other Ambulatory Visit: Payer: Self-pay

## 2020-10-19 ENCOUNTER — Ambulatory Visit: Payer: 59 | Admitting: Neurology

## 2020-10-19 ENCOUNTER — Encounter: Payer: Self-pay | Admitting: Neurology

## 2020-10-19 ENCOUNTER — Telehealth: Payer: Self-pay | Admitting: Neurology

## 2020-10-19 VITALS — BP 141/90 | HR 91 | Ht 65.0 in | Wt 210.4 lb

## 2020-10-19 DIAGNOSIS — R200 Anesthesia of skin: Secondary | ICD-10-CM

## 2020-10-19 DIAGNOSIS — R29898 Other symptoms and signs involving the musculoskeletal system: Secondary | ICD-10-CM | POA: Diagnosis not present

## 2020-10-19 DIAGNOSIS — G459 Transient cerebral ischemic attack, unspecified: Secondary | ICD-10-CM | POA: Diagnosis not present

## 2020-10-19 NOTE — Progress Notes (Signed)
[facility] [address] [place]. [place] [address] [phone]       OFFICE CONSULT NOTE  [name] Date of Birth:  August 28, 1961 Medical Record Number:  [id number]   Referring MD: Annie Main, NP  Reason for Referral: TIA  HPI: Kristie Martinez is a pleasant 59 year old Caucasian lady who is seen today for initial office consultation visit for possible TIA.  History is obtained from the patient, review of electronic medical records and I personally reviewed available imaging films in PACS. She has past medical history of cervical DJD, obesity, anemia who underwent cervical C4-5 ACDF on 10/01/2020 as an outpatient procedure in the surgical center.  Postoperatively she was slow to wake up from anesthesia but not his numbness on the left half of the face as well as subsequently left-sided weakness involving the arm.  She was examined by the nurse who thought she had possible stroke and patient was transferred to [facility] for further evaluation but symptoms resolved in route.  She was seen by neuro hospitalist Dr. Derry Lory who noticed she still has some facial droop and numbness and NIH stroke scale of 2 but her symptoms were felt to be improving into mild to treat with TPA.  He felt patient had decreased touch pinprick sensation in the left V2 and V3 distribution with the patient described to me that she had decreased sensation in the entire left half of the face.  MRI scan of the brain was mostly negative for acute ischemia except for a questionable very weak diffusion hyperintensity in the right parietal subcortical region which was actually more prominently seen on the ADC map and was of unclear significance.  MR angiogram of the brain and carotid ultrasound both did not show large vessel stenosis or occlusion.  Echocardiogram was unremarkable.  LDL cholesterol elevated 125 mg percent.  Hemoglobin A1c was 5.4.  She had no prior history of strokes TIAs significant  vascular problems.  She did not have any known significant risk factors except for recently diagnosed hyperlipidemia for which she start on Lipitor which is tolerating well without muscle aches and pains.  States her blood pressure is usually well controlled at home in the 120 systolic range today slightly elevated in the office at 141/90.  She has no history of DVT, pulmonary embolism, cardiac arrhythmias, family history of strokes or heart attacks at a young age.  She has no prior history of atrial fibrillation cardiac arrhythmias fainting episodes. ROS:   14 system review of systems is positive for numbness, weakness, arm pain and tingling and all other systems negative PMH:  Past Medical History:  Diagnosis Date  . Anemia   . Hamstring injury   . Hx of gastric bypass 2004  . Indigestion   . Obesity   . Post-operative nausea and vomiting   . Varicose veins     Social History:  Social History   Socioeconomic History  . Marital status: Married    Spouse name: Not on file  . Number of children: Not on file  . Years of education: Not on file  . Highest education level: Not on file  Occupational History  . Occupation: Production designer, theatre/television/film for [employer]  Tobacco Use  . Smoking status: Never Smoker  . Smokeless tobacco: Never Used  Vaping Use  . Vaping Use: Never used  Substance and Sexual Activity  . Alcohol use: Yes    Comment: occasional glass of wine.  . Drug use: No  .  Sexual activity: Not on file  Other Topics Concern  . Not on file  Social History Narrative   Lives in [place] with husband.     Right Handed   Drinks 6-8 cups caffeine daily   Social Determinants of Health   Financial Resource Strain:   . Difficulty of Paying Living Expenses: Not on file  Food Insecurity:   . Worried About Programme researcher, broadcasting/film/video in the Last Year: Not on file  . Ran Out of Food in the Last Year: Not on file  Transportation Needs:   . Lack of Transportation (Medical): Not on file  .  Lack of Transportation (Non-Medical): Not on file  Physical Activity:   . Days of Exercise per Week: Not on file  . Minutes of Exercise per Session: Not on file  Stress:   . Feeling of Stress : Not on file  Social Connections:   . Frequency of Communication with Friends and Family: Not on file  . Frequency of Social Gatherings with Friends and Family: Not on file  . Attends Religious Services: Not on file  . Active Member of Clubs or Organizations: Not on file  . Attends Banker Meetings: Not on file  . Marital Status: Not on file  Intimate Partner Violence:   . Fear of Current or Ex-Partner: Not on file  . Emotionally Abused: Not on file  . Physically Abused: Not on file  . Sexually Abused: Not on file    Medications:   Current Outpatient Medications on File Prior to Visit  Medication Sig Dispense Refill  . aspirin EC 81 MG EC tablet Take 1 tablet (81 mg total) by mouth daily. Swallow whole. 30 tablet 11  . atorvastatin (LIPITOR) 40 MG tablet Take 1 tablet (40 mg total) by mouth daily. 30 tablet 2  . Biotin 92119 MCG TABS Take 10,000 mcg by mouth daily.     . calcium carbonate (TUMS - DOSED IN MG ELEMENTAL CALCIUM) 500 MG chewable tablet Chew 1 tablet by mouth daily.    . cholecalciferol (VITAMIN D) 1000 units tablet Take 5,000 Units by mouth daily.     . Cyanocobalamin (VITAMIN B-12 PO) Take 5,000 Units by mouth daily.     . diazepam (VALIUM) 5 MG tablet Take 1 tablet (5 mg total) by mouth every 6 (six) hours as needed for muscle spasms. 20 tablet 0  . diphenhydrAMINE (BENADRYL) 25 mg capsule Take 25 mg by mouth every 6 (six) hours as needed for sleep.     Marland Kitchen docusate sodium (COLACE) 100 MG capsule Take 100 mg by mouth 2 (two) times daily as needed for mild constipation.     . famotidine (PEPCID) 20 MG tablet Take 20 mg by mouth as needed for heartburn or indigestion.    . indomethacin (INDOCIN) 50 MG capsule Take 50 mg by mouth at bedtime.     . Magnesium 400 MG TABS  Take 800 mg by mouth in the morning and at bedtime.     . metaxalone (SKELAXIN) 800 MG tablet Take 800 mg by mouth daily.     . methylcellulose oral powder Take 1 packet by mouth in the morning and at bedtime.     . Multiple Vitamin (MULTIVITAMIN WITH MINERALS) TABS tablet Take 1 tablet by mouth daily.    . ondansetron (ZOFRAN) 4 MG tablet Take 1 tablet (4 mg total) by mouth every 8 (eight) hours as needed for nausea or vomiting. 20 tablet 0  . oxyCODONE-acetaminophen (PERCOCET) 5-325  MG tablet Take 1 tablet by mouth every 4 (four) hours as needed for severe pain. 20 tablet 0  . phentermine 30 MG capsule Take 1 capsule (30 mg total) by mouth every morning. (Patient not taking: Reported on 10/02/2020) 30 capsule 0  . PRESCRIPTION MEDICATION Apply 1-2 Pump topically as needed. Cyclobenzaprine 2%/Diclofenac 3% cream - 3-4x daily PRN    . traZODone (DESYREL) 50 MG tablet Take 0.5-1 tablets (25-50 mg total) by mouth at bedtime as needed for sleep. 30 tablet 3  . vitamin k 100 MCG tablet Take 50 mcg by mouth daily.     Current Facility-Administered Medications on File Prior to Visit  Medication Dose Route Frequency Provider Last Rate Last Admin  . 0.9 %  sodium chloride infusion  500 mL Intravenous Once Rachael FeeJacobs, Daniel P, MD        Allergies:  No Known Allergies  Physical Exam General: well developed, well nourished, seated, in no evident distress Head: head normocephalic and atraumatic.   Neck: supple with no carotid or supraclavicular bruits Cardiovascular: regular rate and rhythm, no murmurs Musculoskeletal: no deformity Skin:  no rash/petichiae Vascular:  Normal pulses all extremities  Neurologic Exam Mental Status: Awake and fully alert. Oriented to place and time. Recent and remote memory intact. Attention span, concentration and fund of knowledge appropriate. Mood and affect appropriate.  Cranial Nerves: Fundoscopic exam reveals sharp disc margins. Pupils equal, briskly reactive to  light. Extraocular movements full without nystagmus. Visual fields full to confrontation.  Mild mechanical drooping of the left eye which is chronic but no ophthalmoplegia or diplopia hearing intact. Facial sensation intact. Face, tongue, palate moves normally and symmetrically.  Motor: Normal bulk and tone. Normal strength in all tested extremity muscles. Sensory.: intact to touch , pinprick , position and vibratory sensation.  Coordination: Rapid alternating movements normal in all extremities. Finger-to-nose and heel-to-shin performed accurately bilaterally. Gait and Station: Arises from chair without difficulty. Stance is normal. Gait demonstrates normal stride length and balance . Able to heel, toe and tandem walk without difficulty.  Reflexes: 1+ and symmetric. Toes downgoing.   NIHSS  0 Modified Rankin  1   ASSESSMENT: 59 year old Caucasian lady with transient episode of left facial numbness and left upper extremity weakness following C4-5 fusion surgery of unclear etiology.  Sudden onset and focal nature suggest TIA however the vascular distribution is atypical for single vessel.  MRI did show very weak diffusion positive right parietal subcortical lesion but this appears to be of unclear significance and would not explain the left half of face numbness and left arm weakness.  Vascular risk factors of mild hyperlipidemia and obesity only     PLAN: I had a long discussion with the patient regarding her episode of transient postoperative left face and upper extremity weakness and numbness which appears to be of unclear etiology.  The neuroanatomical pattern of distribution is not suggestive of a single blood vessel territory TIA.  Moreover the transient nature of her symptoms to argue for a TIA with her against any mechanical problems with the surgery.  I recommend we check CT angiogram of the neck to evaluate the vertebral arteries.  Continue aspirin 81 mg daily for stroke prevention and  maintain aggressive risk factor modification with strict control of hypertension with blood pressure goal below 130/90, lipids with LDL cholesterol goal below 70 mg percent and diabetes with hemoglobin A1c goal below 6.5%.  I encouraged her to eat a healthy diet with lots of fruits, vegetables, cereals,  whole grains and to be active and exercise regularly and lose weight.  I also recommend she repeat lipid profile in 4 to 6 weeks with her primary care physician.  Check transcranial Doppler bubble study to look for PFO.  No routine scheduled appointment with me is necessary but she can return in the future if needed.  Greater than 50% time during this 45-minute consultation visit was spent on counseling and coordination of care about her episode of transient left face numbness and left upper extremity weakness and answering questions. Delia Heady, MD  [facility] [address] [place], [place] [address]  Phone [phone] Fax [phone] Note: This document was prepared with digital dictation and possible smart phrase technology. Any transcriptional errors that result from this process are unintentional.

## 2020-10-19 NOTE — Telephone Encounter (Signed)
cone umr order sent to GI. No auth they will reach out to the patient to schedule.  

## 2020-10-19 NOTE — Patient Instructions (Signed)
I had a long discussion with the patient regarding her episode of transient postoperative left face and upper extremity weakness and numbness which appears to be of unclear etiology.  The neuroanatomical pattern of distribution is not suggestive of a single blood vessel territory TIA.  Moreover the transient nature of her symptoms to argue for a TIA with her against any mechanical problems with the surgery.  I recommend we check CT angiogram of the neck to evaluate the vertebral arteries.  Continue aspirin 81 mg daily for stroke prevention and maintain aggressive risk factor modification with strict control of hypertension with blood pressure goal below 130/90, lipids with LDL cholesterol goal below 70 mg percent and diabetes with hemoglobin A1c goal below 6.5%.  I encouraged her to eat a healthy diet with lots of fruits, vegetables, cereals, whole grains and to be active and exercise regularly and lose weight.  I also recommend she repeat lipid profile in 4 to 6 weeks with her primary care physician.  No routine schedule appointment with me is necessary but she can return in the future if needed.

## 2020-10-20 ENCOUNTER — Telehealth (INDEPENDENT_AMBULATORY_CARE_PROVIDER_SITE_OTHER): Payer: 59 | Admitting: Family Medicine

## 2020-10-20 ENCOUNTER — Encounter: Payer: Self-pay | Admitting: Family Medicine

## 2020-10-20 ENCOUNTER — Other Ambulatory Visit: Payer: Self-pay

## 2020-10-20 DIAGNOSIS — H1033 Unspecified acute conjunctivitis, bilateral: Secondary | ICD-10-CM

## 2020-10-20 MED ORDER — OLOPATADINE HCL 0.2 % OP SOLN: 1.0000 [drp] | Freq: Every day | OPHTHALMIC | 0 refills | Status: DC

## 2020-10-20 MED ORDER — TOBRAMYCIN 0.3 % OP SOLN: 2.0000 [drp] | Freq: Four times a day (QID) | OPHTHALMIC | 0 refills | Status: DC

## 2020-10-20 NOTE — Progress Notes (Signed)
Chief Complaint  Patient presents with  . Eye Drainage    Cleon Thoma Hart is here for b/l eye irritation. Due to COVID-19 pandemic, we are interacting via web portal for an electronic face-to-face visit. I verified patient's ID using 2 identifiers. Patient agreed to proceed with visit via this method. Patient is at home, I am at office. Patient and I are present for visit.   Duration: 4 days  Eyes have been matted shut for this time.  Chemical exposure? No  Recent URI? No  Contact lenses? Yes - has not worn in a while History of allergies? No  Treatment to date: Artificial tears Runny nose, +hx of allergies.   Past Medical History:  Diagnosis Date  . Anemia   . Hamstring injury   . Hx of gastric bypass 2004  . Indigestion   . Obesity   . Post-operative nausea and vomiting   . Varicose veins    Exam No conversational dyspnea Age appropriate judgment and insight I had difficulty seeing her eyes over the video chat, but her sclera appeared to be white with no active d/c.  Nml affect and mood  Acute conjunctivitis of both eyes, unspecified acute conjunctivitis type - Plan: tobramycin (TOBREX) 0.3 % ophthalmic solution, Olopatadine HCl 0.2 % SOLN  Tobrex drops for 7-10 d. I think she may have a component of allergic conjunctivitis, will start Pataday drops daily.  Instructed to practice good hand hygiene and try not to touch face. Warm compresses and artificial tears also recommended. F/u if no improvement in 7-10 days. Pt voiced understanding and agreement to the plan.  Jilda Roche [place], DO 10/20/20 4:02 PM

## 2020-10-23 DIAGNOSIS — Z6833 Body mass index (BMI) 33.0-33.9, adult: Secondary | ICD-10-CM | POA: Diagnosis not present

## 2020-10-23 DIAGNOSIS — M5412 Radiculopathy, cervical region: Secondary | ICD-10-CM | POA: Diagnosis not present

## 2020-10-23 DIAGNOSIS — R030 Elevated blood-pressure reading, without diagnosis of hypertension: Secondary | ICD-10-CM | POA: Diagnosis not present

## 2020-10-26 DIAGNOSIS — M542 Cervicalgia: Secondary | ICD-10-CM | POA: Diagnosis not present

## 2020-10-29 ENCOUNTER — Ambulatory Visit
Admission: RE | Admit: 2020-10-29 | Discharge: 2020-10-29 | Disposition: A | Discharge status: Home / Self Care | Payer: 59 | Source: Ambulatory Visit | Attending: Neurology | Admitting: Neurology

## 2020-10-29 ENCOUNTER — Other Ambulatory Visit: Payer: Self-pay

## 2020-10-29 DIAGNOSIS — I639 Cerebral infarction, unspecified: Secondary | ICD-10-CM | POA: Diagnosis not present

## 2020-10-29 MED ORDER — IOPAMIDOL (ISOVUE-370) INJECTION 76%
75.0000 mL | Freq: Once | INTRAVENOUS | Status: AC | PRN
  Administered 2020-10-29: 75 mL via INTRAVENOUS

## 2020-10-30 ENCOUNTER — Other Ambulatory Visit: Payer: 59

## 2020-10-30 NOTE — Progress Notes (Signed)
Kindly inform the patient that CT angiogram study of the neck shows no significant narrowing of either carotid artery or vertebral artery in the neck and was normal

## 2020-11-02 ENCOUNTER — Encounter: Payer: Self-pay | Admitting: *Deleted

## 2020-11-04 MED FILL — ATORVASTATIN CALCIUM 40 MG: 40 | 30 days supply | Qty: 30 | Fill #0

## 2020-11-09 ENCOUNTER — Ambulatory Visit (HOSPITAL_COMMUNITY)
Admission: RE | Admit: 2020-11-09 | Discharge: 2020-11-09 | Disposition: A | Discharge status: Home / Self Care | Payer: 59 | Source: Ambulatory Visit | Attending: Neurology | Admitting: Neurology

## 2020-11-09 ENCOUNTER — Other Ambulatory Visit: Payer: Self-pay

## 2020-11-09 DIAGNOSIS — R200 Anesthesia of skin: Secondary | ICD-10-CM | POA: Diagnosis not present

## 2020-11-09 NOTE — Progress Notes (Signed)
TCD bubble study completed with Dr. Pearlean Brownie. Refer to "CV Proc" under chart review to view preliminary results.  11/09/2020 2:03 PM Eula Fried., MHA, RVT, RDCS, RDMS

## 2020-11-12 ENCOUNTER — Ambulatory Visit: Payer: 59 | Admitting: Family Medicine

## 2020-11-12 ENCOUNTER — Encounter: Payer: Self-pay | Admitting: Family Medicine

## 2020-11-12 ENCOUNTER — Other Ambulatory Visit: Payer: Self-pay | Admitting: Family Medicine

## 2020-11-12 ENCOUNTER — Other Ambulatory Visit: Payer: Self-pay

## 2020-11-12 VITALS — BP 98/60 | HR 96 | Temp 98.0°F | Resp 18 | Ht 66.0 in | Wt 203.0 lb

## 2020-11-12 DIAGNOSIS — G4700 Insomnia, unspecified: Secondary | ICD-10-CM

## 2020-11-12 DIAGNOSIS — E785 Hyperlipidemia, unspecified: Secondary | ICD-10-CM | POA: Diagnosis not present

## 2020-11-12 LAB — LIPID PANEL
Cholesterol: 111 mg/dL (ref 0–200)
HDL: 51.4 mg/dL (ref >39.00)
LDL Cholesterol: 50 mg/dL (ref 0–99)
NonHDL: 59.44
Total CHOL/HDL Ratio: 2
Triglycerides: 45 mg/dL (ref 0.0–149.0)
VLDL: 9 mg/dL (ref 0.0–40.0)

## 2020-11-12 MED ORDER — TRAZODONE HCL 50 MG PO TABS: 25.0000 mg | ORAL_TABLET | Freq: Every evening | ORAL | 2 refills | Status: DC | PRN

## 2020-11-12 MED FILL — traZODone HCL 50 MG TABS: 50 | 90 days supply | Qty: 90 | Fill #0

## 2020-11-12 NOTE — Patient Instructions (Signed)
Stay hydrated.  Consider taking CoQ10 for aches with statins.  Keep the diet clean and stay active.  Give [place] 2-3 business days to get the results of your labs back.   Let us know if you need anything.

## 2020-11-12 NOTE — Progress Notes (Signed)
Chief Complaint  Patient presents with  . Follow-up    Subjective: Hyperlipidemia Patient presents for Hyperlipidemia follow up. Currently taking Lipitor 40 mg/d and compliance with treatment thus far has been good. She complains of myalgias. She is adhering to a healthy diet. Exercise:  The patient is not known to have coexisting coronary artery disease. No CP and SOB.   Insomnia Hx of insomnia, takes trazodone 25-50 mg qhs prn. No AE's. It is effective.   Past Medical History:  Diagnosis Date  . Anemia   . Hamstring injury   . Hx of gastric bypass 2004  . Indigestion   . Obesity   . Post-operative nausea and vomiting   . Varicose veins     Objective: BP 98/60   Pulse 96   Temp 98 F (36.7 C) (Oral)   Resp 18   Ht 5\' 6"  (1.676 m)   Wt 203 lb (92.1 kg)   SpO2 100%   BMI 32.77 kg/m  General: Awake, appears stated age HEENT: MMM Heart: RRR, no LE edema, no bruits Lungs: CTAB, no rales, wheezes or rhonchi. No accessory muscle use Psych: Age appropriate judgment and insight, normal affect and mood  Assessment and Plan: Hyperlipidemia, unspecified hyperlipidemia type - Plan: Lipid panel  Insomnia, unspecified type - Plan: traZODone (DESYREL) 50 MG tablet  1.  Continue Lipitor 40 mg daily.  Check lipids.  She is interested in coming off of medication though with her questionable stroke, I would continue for at least the next year.  Counseled on diet and exercise. 2.  Continue trazodone 25-50 mg nightly as needed. F/u in 6 mo for CPE or prn. The patient voiced understanding and agreement to the plan.  [place], DO 11/12/20  10:41 AM

## 2020-11-26 DIAGNOSIS — M542 Cervicalgia: Secondary | ICD-10-CM | POA: Diagnosis not present

## 2020-12-10 ENCOUNTER — Other Ambulatory Visit (HOSPITAL_BASED_OUTPATIENT_CLINIC_OR_DEPARTMENT_OTHER): Payer: Self-pay | Admitting: Family Medicine

## 2020-12-10 ENCOUNTER — Other Ambulatory Visit: Payer: Self-pay

## 2020-12-10 ENCOUNTER — Ambulatory Visit (HOSPITAL_BASED_OUTPATIENT_CLINIC_OR_DEPARTMENT_OTHER)
Admission: RE | Admit: 2020-12-10 | Discharge: 2020-12-10 | Disposition: A | Discharge status: Home / Self Care | Payer: 59 | Source: Ambulatory Visit | Attending: Family Medicine | Admitting: Family Medicine

## 2020-12-10 DIAGNOSIS — Z1231 Encounter for screening mammogram for malignant neoplasm of breast: Secondary | ICD-10-CM

## 2020-12-10 MED FILL — ATORVASTATIN 40 MG TABLET: 40 | 30 days supply | Qty: 30 | Fill #1

## 2020-12-11 ENCOUNTER — Other Ambulatory Visit (HOSPITAL_BASED_OUTPATIENT_CLINIC_OR_DEPARTMENT_OTHER): Payer: Self-pay | Admitting: Neurological Surgery

## 2020-12-11 MED FILL — METHYLPREDNISOLONE 4 MG TBP: 4 | 6 days supply | Qty: 21 | Fill #0

## 2020-12-15 ENCOUNTER — Other Ambulatory Visit (HOSPITAL_BASED_OUTPATIENT_CLINIC_OR_DEPARTMENT_OTHER): Payer: Self-pay | Admitting: Neurological Surgery

## 2020-12-15 ENCOUNTER — Ambulatory Visit: Payer: 59 | Admitting: Neurology

## 2020-12-15 DIAGNOSIS — M5030 Other cervical disc degeneration, unspecified cervical region: Secondary | ICD-10-CM | POA: Diagnosis not present

## 2020-12-15 DIAGNOSIS — R030 Elevated blood-pressure reading, without diagnosis of hypertension: Secondary | ICD-10-CM | POA: Diagnosis not present

## 2020-12-15 DIAGNOSIS — Z6833 Body mass index (BMI) 33.0-33.9, adult: Secondary | ICD-10-CM | POA: Diagnosis not present

## 2020-12-15 MED FILL — METAXALONE 800 MG TABLET: 800 | 13 days supply | Qty: 40 | Fill #0

## 2020-12-15 MED FILL — MELOXICAM 15 MG TABLET: 15 | 60 days supply | Qty: 60 | Fill #0

## 2020-12-28 DIAGNOSIS — H10023 Other mucopurulent conjunctivitis, bilateral: Secondary | ICD-10-CM | POA: Diagnosis not present

## 2020-12-28 DIAGNOSIS — H02052 Trichiasis without entropian right lower eyelid: Secondary | ICD-10-CM | POA: Diagnosis not present

## 2021-01-07 ENCOUNTER — Other Ambulatory Visit: Payer: 59

## 2021-01-07 ENCOUNTER — Other Ambulatory Visit: Payer: Self-pay

## 2021-01-07 DIAGNOSIS — Z20822 Contact with and (suspected) exposure to covid-19: Secondary | ICD-10-CM

## 2021-01-07 DIAGNOSIS — Z03818 Encounter for observation for suspected exposure to other biological agents ruled out: Secondary | ICD-10-CM | POA: Diagnosis not present

## 2021-01-08 ENCOUNTER — Other Ambulatory Visit: Payer: Self-pay | Admitting: Family Medicine

## 2021-01-08 DIAGNOSIS — E785 Hyperlipidemia, unspecified: Secondary | ICD-10-CM

## 2021-01-08 LAB — SARS-COV-2, NAA 2 DAY TAT

## 2021-01-08 LAB — NOVEL CORONAVIRUS, NAA: SARS-CoV-2, NAA: NOT DETECTED

## 2021-01-27 ENCOUNTER — Other Ambulatory Visit: Payer: Self-pay

## 2021-01-27 ENCOUNTER — Other Ambulatory Visit (INDEPENDENT_AMBULATORY_CARE_PROVIDER_SITE_OTHER): Payer: 59

## 2021-01-27 DIAGNOSIS — E785 Hyperlipidemia, unspecified: Secondary | ICD-10-CM

## 2021-01-27 LAB — LIPID PANEL
Cholesterol: 134 mg/dL (ref 0–200)
HDL: 60 mg/dL (ref >39.00)
LDL Cholesterol: 64 mg/dL (ref 0–99)
NonHDL: 73.69
Total CHOL/HDL Ratio: 2
Triglycerides: 50 mg/dL (ref 0.0–149.0)
VLDL: 10 mg/dL (ref 0.0–40.0)

## 2021-01-27 MED FILL — METAXALONE 800 MG TABLET: 800 | 13 days supply | Qty: 40 | Fill #1

## 2021-02-11 ENCOUNTER — Other Ambulatory Visit (HOSPITAL_COMMUNITY): Payer: Self-pay | Admitting: Pharmacist

## 2021-02-11 MED FILL — CARESTART COVID-19 HOME TES: 4 days supply | Qty: 4 | Fill #0

## 2021-03-01 ENCOUNTER — Ambulatory Visit: Payer: 59 | Attending: Internal Medicine

## 2021-03-01 DIAGNOSIS — Z23 Encounter for immunization: Secondary | ICD-10-CM

## 2021-03-01 NOTE — Progress Notes (Signed)
   Covid-19 Vaccination Clinic  Name:  SALIMATA CHRISTENSON    MRN: [id number] DOB: 02-21-1961  03/01/2021  Ms. Slavens was observed post Covid-19 immunization for 15 minutes without incident. She was provided with Vaccine Information Sheet and instruction to access the V-Safe system.   Ms. Heinrich was instructed to call 911 with any severe reactions post vaccine: Marland Kitchen Difficulty breathing  . Swelling of face and throat  . A fast heartbeat  . A bad rash all over body  . Dizziness and weakness   Immunizations Administered    Name Date Dose VIS Date Route   PFIZER Comrnaty(Gray TOP) Covid-19 Vaccine 03/01/2021  2:19 PM 0.3 mL 10/29/2020 Intramuscular   Manufacturer: [employer], [employer]   Lot: [id number]   NDC: [phone]

## 2021-03-05 ENCOUNTER — Other Ambulatory Visit (HOSPITAL_BASED_OUTPATIENT_CLINIC_OR_DEPARTMENT_OTHER): Payer: Self-pay

## 2021-03-05 MED ORDER — PFIZER-BIONT COVID-19 VAC-TRIS 30 MCG/0.3ML IM SUSP
INTRAMUSCULAR | 0 refills | Status: DC
  Filled 2021-03-05: qty 0.3, 1d supply, fill #0

## 2021-03-11 ENCOUNTER — Other Ambulatory Visit (HOSPITAL_BASED_OUTPATIENT_CLINIC_OR_DEPARTMENT_OTHER): Payer: Self-pay

## 2021-03-11 MED ORDER — PHENTERMINE HCL 37.5 MG PO TABS
ORAL_TABLET | ORAL | 2 refills | Status: DC
  Filled 2021-03-11: qty 30, 30d supply, fill #0
  Filled 2021-04-16: qty 30, 30d supply, fill #1
  Filled 2021-05-21: qty 30, 30d supply, fill #2

## 2021-03-23 ENCOUNTER — Other Ambulatory Visit (HOSPITAL_BASED_OUTPATIENT_CLINIC_OR_DEPARTMENT_OTHER): Payer: Self-pay

## 2021-03-23 MED FILL — Metaxalone Tab 800 MG: ORAL | 14 days supply | Qty: 40 | Fill #0 | Status: CN

## 2021-03-24 ENCOUNTER — Ambulatory Visit: Payer: 59 | Attending: Critical Care Medicine

## 2021-03-24 ENCOUNTER — Other Ambulatory Visit (HOSPITAL_BASED_OUTPATIENT_CLINIC_OR_DEPARTMENT_OTHER): Payer: Self-pay

## 2021-03-24 DIAGNOSIS — Z20822 Contact with and (suspected) exposure to covid-19: Secondary | ICD-10-CM | POA: Diagnosis not present

## 2021-03-25 ENCOUNTER — Other Ambulatory Visit (HOSPITAL_BASED_OUTPATIENT_CLINIC_OR_DEPARTMENT_OTHER): Payer: Self-pay

## 2021-03-25 LAB — NOVEL CORONAVIRUS, NAA: SARS-CoV-2, NAA: NOT DETECTED

## 2021-04-01 ENCOUNTER — Other Ambulatory Visit (HOSPITAL_BASED_OUTPATIENT_CLINIC_OR_DEPARTMENT_OTHER): Payer: Self-pay

## 2021-04-12 ENCOUNTER — Other Ambulatory Visit (HOSPITAL_BASED_OUTPATIENT_CLINIC_OR_DEPARTMENT_OTHER): Payer: Self-pay

## 2021-04-12 MED FILL — Metaxalone Tab 800 MG: ORAL | 14 days supply | Qty: 40 | Fill #0 | Status: AC

## 2021-04-16 ENCOUNTER — Other Ambulatory Visit (HOSPITAL_BASED_OUTPATIENT_CLINIC_OR_DEPARTMENT_OTHER): Payer: Self-pay

## 2021-04-28 ENCOUNTER — Other Ambulatory Visit (HOSPITAL_BASED_OUTPATIENT_CLINIC_OR_DEPARTMENT_OTHER): Payer: Self-pay

## 2021-04-28 MED ORDER — OFLOXACIN 0.3 % OP SOLN
1.0000 [drp] | Freq: Three times a day (TID) | OPHTHALMIC | 1 refills | Status: DC
  Filled 2021-04-28: qty 5, 14d supply, fill #0
  Filled 2021-05-21: qty 5, 14d supply, fill #1

## 2021-05-03 DIAGNOSIS — H10413 Chronic giant papillary conjunctivitis, bilateral: Secondary | ICD-10-CM | POA: Diagnosis not present

## 2021-05-03 DIAGNOSIS — H02052 Trichiasis without entropian right lower eyelid: Secondary | ICD-10-CM | POA: Diagnosis not present

## 2021-05-21 ENCOUNTER — Other Ambulatory Visit (HOSPITAL_BASED_OUTPATIENT_CLINIC_OR_DEPARTMENT_OTHER): Payer: Self-pay

## 2021-05-25 ENCOUNTER — Other Ambulatory Visit: Payer: Self-pay

## 2021-05-25 ENCOUNTER — Encounter: Payer: Self-pay | Admitting: Family Medicine

## 2021-05-25 ENCOUNTER — Ambulatory Visit: Payer: 59 | Admitting: Family Medicine

## 2021-05-25 ENCOUNTER — Other Ambulatory Visit: Payer: Self-pay | Admitting: Family Medicine

## 2021-05-25 ENCOUNTER — Other Ambulatory Visit (HOSPITAL_BASED_OUTPATIENT_CLINIC_OR_DEPARTMENT_OTHER): Payer: Self-pay

## 2021-05-25 VITALS — BP 108/74 | HR 88 | Temp 98.2°F | Ht 66.0 in | Wt 183.0 lb

## 2021-05-25 DIAGNOSIS — H579 Unspecified disorder of eye and adnexa: Secondary | ICD-10-CM

## 2021-05-25 DIAGNOSIS — M25551 Pain in right hip: Secondary | ICD-10-CM

## 2021-05-25 DIAGNOSIS — R252 Cramp and spasm: Secondary | ICD-10-CM

## 2021-05-25 DIAGNOSIS — Z862 Personal history of diseases of the blood and blood-forming organs and certain disorders involving the immune mechanism: Secondary | ICD-10-CM

## 2021-05-25 DIAGNOSIS — E785 Hyperlipidemia, unspecified: Secondary | ICD-10-CM

## 2021-05-25 MED ORDER — TOBRAMYCIN 0.3 % OP SOLN
2.0000 [drp] | Freq: Four times a day (QID) | OPHTHALMIC | 0 refills | Status: DC
  Filled 2021-05-25: qty 5, 13d supply, fill #0

## 2021-05-25 MED ORDER — TRAMADOL HCL 50 MG PO TABS
50.0000 mg | ORAL_TABLET | Freq: Three times a day (TID) | ORAL | 0 refills | Status: DC | PRN
  Filled 2021-05-25: qty 30, 10d supply, fill #0

## 2021-05-25 MED ORDER — TETRACAINE HCL 0.5 % OP SOLN
OPHTHALMIC | 0 refills | Status: DC
  Filled 2021-05-25: qty 5, 10d supply, fill #0

## 2021-05-25 NOTE — Progress Notes (Signed)
Musculoskeletal Exam  Patient: Kristie Martinez DOB: May 10, 1961  DOS: 05/25/2021  SUBJECTIVE:  Chief Complaint:   Chief Complaint  Patient presents with   Hip Pain    Leg cramps    Eye Drainage    Kristie Martinez is a 60 y.o.  female for evaluation and treatment of R hip pain.   Onset:  2 weeks ago. No inj or change in activity.  Location: R outer hip Character:  aching and sharp  Progression of issue:  has worsened Associated symptoms: radiating down lateral thigh, cramping in her legs, some back pain over past 3 d Denies: Bruising, redness, swelling Treatment: to date has been ice, prescription NSAIDS, muscle relaxers, and heat.   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 She has been taking ofloxacin without relief. No eye pain or vision changes.   Past Medical History:  Diagnosis Date   Anemia    Hamstring injury    Hx of gastric bypass 2004   Indigestion    Obesity    Post-operative nausea and vomiting    Varicose veins     Objective: VITAL SIGNS: BP 108/74   Pulse 88   Temp 98.2 F (36.8 C) (Oral)   Ht 5\' 6"  (1.676 m)   Wt 183 lb (83 kg)   SpO2 94%   BMI 29.54 kg/m  Constitutional: Well formed, well developed. No acute distress. Eyes: I do not appreciate any scleral or conjunctival injection, drainage, or foreign body bilaterally.  PERRLA.  On fluorescein exam, there is no evidence of corneal abrasion. Thorax & Lungs: No accessory muscle use Musculoskeletal: R hip.   Normal active range of motion: yes.   Normal passive range of motion: yes Tenderness to palpation: Over proximal glutes and distal lumbar erector spinae muscle group Deformity: no Ecchymosis: no Tests positive: None Tests negative: Straight leg, Lasegue's, Stinchfield, logroll, FABER, FADDIR Neurologic: Normal sensory function. No focal deficits noted. DTR's equal and symmetric in LE's. No clonus.  Gait is  normal. Psychiatric: Normal mood. Age appropriate judgment and insight. Alert & oriented x 3.    Assessment:  Right hip pain - Plan: traMADol (ULTRAM) 50 MG tablet  Leg cramping - Plan: Comprehensive metabolic panel, Magnesium  Left eye complaint - Plan: tetracaine (PONTOCAINE) 0.5 % ophthalmic solution, tobramycin (TOBREX) 0.3 % ophthalmic solution  Plan: I think this is coming from her lower back region.  Stretches/exercises, heat, ice, Tylenol.  Tramadol for breakthrough pain.  Warnings about this medicine verbalized and written down.  She is a and is aware of these risks. Stay hydrated, check above labs.  She is coming back for future labs where we are monitoring her iron as well. Will change ofloxacin to tobramycin.  Tetracaine as needed.  Continue artificial tear use.  She will follow-up with her ophthalmologist next week when they get back from vacation. F/u as originally scheduled. The patient voiced understanding and agreement to the plan.  I spent around 32 minutes with the patient and reviewing her chart on the same day of visit.  Engineer, civil (consulting) [place], DO 05/25/21  4:40 PM

## 2021-05-25 NOTE — Progress Notes (Signed)
f °

## 2021-05-25 NOTE — Patient Instructions (Signed)
Heat (pad or rice pillow in microwave) over affected area, 10-15 minutes twice daily.   OK to take Tylenol 1000 mg (2 extra strength tabs) or 975 mg (3 regular strength tabs) every 6 hours as needed.  Ibuprofen 400-600 mg (2-3 over the counter strength tabs) every 6 hours as needed for pain.  Do not drink alcohol, do any illicit/street drugs, drive or do anything that requires alertness while on this medicine.   Continue with the artificial tears. I do not appreciate any abrasions or foreign bodies.   Let us know if you need anything.  EXERCISES  RANGE OF MOTION (ROM) AND STRETCHING EXERCISES - Low Back Pain Most people with lower back pain will find that their symptoms get worse with excessive bending forward (flexion) or arching at the lower back (extension). The exercises that will help resolve your symptoms will focus on the opposite motion.  If you have pain, numbness or tingling which travels down into your buttocks, leg or foot, the goal of the therapy is for these symptoms to move closer to your back and eventually resolve. Sometimes, these leg symptoms will get better, but your lower back pain may worsen. This is often an indication of progress in your rehabilitation. Be very alert to any changes in your symptoms and the activities in which you participated in the 24 hours prior to the change. Sharing this information with your caregiver will allow him or her to most efficiently treat your condition. These exercises may help you when beginning to rehabilitate your injury. Your symptoms may resolve with or without further involvement from your physician, physical therapist or athletic trainer. While completing these exercises, remember:  Restoring tissue flexibility helps normal motion to return to the joints. This allows healthier, less painful movement and activity. An effective stretch should be held for at least 30 seconds. A stretch should never be painful. You should only feel a  gentle lengthening or release in the stretched tissue. FLEXION RANGE OF MOTION AND STRETCHING EXERCISES:  STRETCH - Flexion, Single Knee to Chest  Lie on a firm bed or floor with both legs extended in front of you. Keeping one leg in contact with the floor, bring your opposite knee to your chest. Hold your leg in place by either grabbing behind your thigh or at your knee. Pull until you feel a gentle stretch in your low back. Hold 30 seconds. Slowly release your grasp and repeat the exercise with the opposite side. Repeat 2 times. Complete this exercise 3 times per week.   STRETCH - Flexion, Double Knee to Chest Lie on a firm bed or floor with both legs extended in front of you. Keeping one leg in contact with the floor, bring your opposite knee to your chest. Tense your stomach muscles to support your back and then lift your other knee to your chest. Hold your legs in place by either grabbing behind your thighs or at your knees. Pull both knees toward your chest until you feel a gentle stretch in your low back. Hold 30 seconds. Tense your stomach muscles and slowly return one leg at a time to the floor. Repeat 2 times. Complete this exercise 3 times per week.   STRETCH - Low Trunk Rotation Lie on a firm bed or floor. Keeping your legs in front of you, bend your knees so they are both pointed toward the ceiling and your feet are flat on the floor. Extend your arms out to the side. This will stabilize your  upper body by keeping your shoulders in contact with the floor. Gently and slowly drop both knees together to one side until you feel a gentle stretch in your low back. Hold for 30 seconds. Tense your stomach muscles to support your lower back as you bring your knees back to the starting position. Repeat the exercise to the other side. Repeat 2 times. Complete this exercise at least 3 times per week.   EXTENSION RANGE OF MOTION AND FLEXIBILITY EXERCISES:  STRETCH - Extension, Prone on  Elbows  Lie on your stomach on the floor, a bed will be too soft. Place your palms about shoulder width apart and at the height of your head. Place your elbows under your shoulders. If this is too painful, stack pillows under your chest. Allow your body to relax so that your hips drop lower and make contact more completely with the floor. Hold this position for 30 seconds. Slowly return to lying flat on the floor. Repeat 2 times. Complete this exercise 3 times per week.   RANGE OF MOTION - Extension, Prone Press Ups Lie on your stomach on the floor, a bed will be too soft. Place your palms about shoulder width apart and at the height of your head. Keeping your back as relaxed as possible, slowly straighten your elbows while keeping your hips on the floor. You may adjust the placement of your hands to maximize your comfort. As you gain motion, your hands will come more underneath your shoulders. Hold this position 30 seconds. Slowly return to lying flat on the floor. Repeat 2 times. Complete this exercise 3 times per week.   RANGE OF MOTION- Quadruped, Neutral Spine  Assume a hands and knees position on a firm surface. Keep your hands under your shoulders and your knees under your hips. You may place padding under your knees for comfort. Drop your head and point your tailbone toward the ground below you. This will round out your lower back like an angry cat. Hold this position for 30 seconds. Slowly lift your head and release your tail bone so that your back sags into a large arch, like an old horse. Hold this position for 30 seconds. Repeat this until you feel limber in your low back. Now, find your "sweet spot." This will be the most comfortable position somewhere between the two previous positions. This is your neutral spine. Once you have found this position, tense your stomach muscles to support your low back. Hold this position for 30 seconds. Repeat 2 times. Complete this exercise 3  times per week.   STRENGTHENING EXERCISES - Low Back Sprain These exercises may help you when beginning to rehabilitate your injury. These exercises should be done near your "sweet spot." This is the neutral, low-back arch, somewhere between fully rounded and fully arched, that is your least painful position. When performed in this safe range of motion, these exercises can be used for people who have either a flexion or extension based injury. These exercises may resolve your symptoms with or without further involvement from your physician, physical therapist or athletic trainer. While completing these exercises, remember:  Muscles can gain both the endurance and the strength needed for everyday activities through controlled exercises. Complete these exercises as instructed by your physician, physical therapist or athletic trainer. Increase the resistance and repetitions only as guided. You may experience muscle soreness or fatigue, but the pain or discomfort you are trying to eliminate should never worsen during these exercises. If this pain  does worsen, stop and make certain you are following the directions exactly. If the pain is still present after adjustments, discontinue the exercise until you can discuss the trouble with your caregiver.  STRENGTHENING - Deep Abdominals, Pelvic Tilt  Lie on a firm bed or floor. Keeping your legs in front of you, bend your knees so they are both pointed toward the ceiling and your feet are flat on the floor. Tense your lower abdominal muscles to press your low back into the floor. This motion will rotate your pelvis so that your tail bone is scooping upwards rather than pointing at your feet or into the floor. With a gentle tension and even breathing, hold this position for 3 seconds. Repeat 2 times. Complete this exercise 3 times per week.   STRENGTHENING - Abdominals, Crunches  Lie on a firm bed or floor. Keeping your legs in front of you, bend your knees so  they are both pointed toward the ceiling and your feet are flat on the floor. Cross your arms over your chest. Slightly tip your chin down without bending your neck. Tense your abdominals and slowly lift your trunk high enough to just clear your shoulder blades. Lifting higher can put excessive stress on the lower back and does not further strengthen your abdominal muscles. Control your return to the starting position. Repeat 2 times. Complete this exercise 3 times per week.   STRENGTHENING - Quadruped, Opposite UE/LE Lift  Assume a hands and knees position on a firm surface. Keep your hands under your shoulders and your knees under your hips. You may place padding under your knees for comfort. Find your neutral spine and gently tense your abdominal muscles so that you can maintain this position. Your shoulders and hips should form a rectangle that is parallel with the floor and is not twisted. Keeping your trunk steady, lift your right hand no higher than your shoulder and then your left leg no higher than your hip. Make sure you are not holding your breath. Hold this position for 30 seconds. Continuing to keep your abdominal muscles tense and your back steady, slowly return to your starting position. Repeat with the opposite arm and leg. Repeat 2 times. Complete this exercise 3 times per week.   STRENGTHENING - Abdominals and Quadriceps, Straight Leg Raise  Lie on a firm bed or floor with both legs extended in front of you. Keeping one leg in contact with the floor, bend the other knee so that your foot can rest flat on the floor. Find your neutral spine, and tense your abdominal muscles to maintain your spinal position throughout the exercise. Slowly lift your straight leg off the floor about 6 inches for a count of 3, making sure to not hold your breath. Still keeping your neutral spine, slowly lower your leg all the way to the floor. Repeat this exercise with each leg 2 times. Complete this  exercise 3 times per week.  POSTURE AND BODY MECHANICS CONSIDERATIONS - Low Back Sprain Keeping correct posture when sitting, standing or completing your activities will reduce the stress put on different body tissues, allowing injured tissues a chance to heal and limiting painful experiences. The following are general guidelines for improved posture.  While reading these guidelines, remember: The exercises prescribed by your provider will help you have the flexibility and strength to maintain correct postures. The correct posture provides the best environment for your joints to work. All of your joints have less wear and tear when properly  supported by a spine with good posture. This means you will experience a healthier, less painful body. Correct posture must be practiced with all of your activities, especially prolonged sitting and standing. Correct posture is as important when doing repetitive low-stress activities (typing) as it is when doing a single heavy-load activity (lifting).  RESTING POSITIONS Consider which positions are most painful for you when choosing a resting position. If you have pain with flexion-based activities (sitting, bending, stooping, squatting), choose a position that allows you to rest in a less flexed posture. You would want to avoid curling into a fetal position on your side. If your pain worsens with extension-based activities (prolonged standing, working overhead), avoid resting in an extended position such as sleeping on your stomach. Most people will find more comfort when they rest with their spine in a more neutral position, neither too rounded nor too arched. Lying on a non-sagging bed on your side with a pillow between your knees, or on your back with a pillow under your knees will often provide some relief. Keep in mind, being in any one position for a prolonged period of time, no matter how correct your posture, can still lead to stiffness.  PROPER SITTING  POSTURE In order to minimize stress and discomfort on your spine, you must sit with correct posture. Sitting with good posture should be effortless for a healthy body. Returning to good posture is a gradual process. Many people can work toward this most comfortably by using various supports until they have the flexibility and strength to maintain this posture on their own. When sitting with proper posture, your ears will fall over your shoulders and your shoulders will fall over your hips. You should use the back of the chair to support your upper back. Your lower back will be in a neutral position, just slightly arched. You may place a small pillow or folded towel at the base of your lower back for  support.  When working at a desk, create an environment that supports good, upright posture. Without extra support, muscles tire, which leads to excessive strain on joints and other tissues. Keep these recommendations in mind:  CHAIR: A chair should be able to slide under your desk when your back makes contact with the back of the chair. This allows you to work closely. The chair's height should allow your eyes to be level with the upper part of your monitor and your hands to be slightly lower than your elbows.  BODY POSITION Your feet should make contact with the floor. If this is not possible, use a foot rest. Keep your ears over your shoulders. This will reduce stress on your neck and low back.  INCORRECT SITTING POSTURES  If you are feeling tired and unable to assume a healthy sitting posture, do not slouch or slump. This puts excessive strain on your back tissues, causing more damage and pain. Healthier options include: Using more support, like a lumbar pillow. Switching tasks to something that requires you to be upright or walking. Talking a brief walk. Lying down to rest in a neutral-spine position.  PROLONGED STANDING WHILE SLIGHTLY LEANING FORWARD  When completing a task that requires you  to lean forward while standing in one place for a long time, place either foot up on a stationary 2-4 inch high object to help maintain the best posture. When both feet are on the ground, the lower back tends to lose its slight inward curve. If this curve flattens (or becomes  too large), then the back and your other joints will experience too much stress, tire more quickly, and can cause pain.  CORRECT STANDING POSTURES Proper standing posture should be assumed with all daily activities, even if they only take a few moments, like when brushing your teeth. As in sitting, your ears should fall over your shoulders and your shoulders should fall over your hips. You should keep a slight tension in your abdominal muscles to brace your spine. Your tailbone should point down to the ground, not behind your body, resulting in an over-extended swayback posture.   INCORRECT STANDING POSTURES  Common incorrect standing postures include a forward head, locked knees and/or an excessive swayback. WALKING Walk with an upright posture. Your ears, shoulders and hips should all line-up.  PROLONGED ACTIVITY IN A FLEXED POSITION When completing a task that requires you to bend forward at your waist or lean over a low surface, try to find a way to stabilize 3 out of 4 of your limbs. You can place a hand or elbow on your thigh or rest a knee on the surface you are reaching across. This will provide you more stability, so that your muscles do not tire as quickly. By keeping your knees relaxed, or slightly bent, you will also reduce stress across your lower back. CORRECT LIFTING TECHNIQUES  DO : Assume a wide stance. This will provide you more stability and the opportunity to get as close as possible to the object which you are lifting. Tense your abdominals to brace your spine. Bend at the knees and hips. Keeping your back locked in a neutral-spine position, lift using your leg muscles. Lift with your legs, keeping your back  straight. Test the weight of unknown objects before attempting to lift them. Try to keep your elbows locked down at your sides in order get the best strength from your shoulders when carrying an object.   Always ask for help when lifting heavy or awkward objects. INCORRECT LIFTING TECHNIQUES DO NOT:  Lock your knees when lifting, even if it is a small object. Bend and twist. Pivot at your feet or move your feet when needing to change directions. Assume that you can safely pick up even a paperclip without proper posture.

## 2021-05-26 ENCOUNTER — Other Ambulatory Visit (HOSPITAL_BASED_OUTPATIENT_CLINIC_OR_DEPARTMENT_OTHER): Payer: Self-pay

## 2021-05-27 ENCOUNTER — Other Ambulatory Visit (INDEPENDENT_AMBULATORY_CARE_PROVIDER_SITE_OTHER): Payer: 59

## 2021-05-27 ENCOUNTER — Other Ambulatory Visit: Payer: Self-pay

## 2021-05-27 DIAGNOSIS — R252 Cramp and spasm: Secondary | ICD-10-CM

## 2021-05-27 DIAGNOSIS — E785 Hyperlipidemia, unspecified: Secondary | ICD-10-CM

## 2021-05-27 DIAGNOSIS — Z862 Personal history of diseases of the blood and blood-forming organs and certain disorders involving the immune mechanism: Secondary | ICD-10-CM

## 2021-05-27 LAB — IBC + FERRITIN
Ferritin: 4.5 ng/mL — ABNORMAL LOW (ref 10.0–291.0)
Iron: 13 ug/dL — ABNORMAL LOW (ref 42–145)
Saturation Ratios: 2.7 % — ABNORMAL LOW (ref 20.0–50.0)
Transferrin: 349 mg/dL (ref 212.0–360.0)

## 2021-05-27 LAB — COMPREHENSIVE METABOLIC PANEL
ALT: 22 U/L (ref 0–35)
AST: 22 U/L (ref 0–37)
Albumin: 4.2 g/dL (ref 3.5–5.2)
Alkaline Phosphatase: 94 U/L (ref 39–117)
BUN: 16 mg/dL (ref 6–23)
CO2: 29 mEq/L (ref 19–32)
Calcium: 9.1 mg/dL (ref 8.4–10.5)
Chloride: 104 mEq/L (ref 96–112)
Creatinine, Ser: 0.72 mg/dL (ref 0.40–1.20)
GFR: 92.26 mL/min (ref >60.00)
Glucose, Bld: 92 mg/dL (ref 70–99)
Potassium: 4.5 mEq/L (ref 3.5–5.1)
Sodium: 139 mEq/L (ref 135–145)
Total Bilirubin: 0.3 mg/dL (ref 0.2–1.2)
Total Protein: 6.6 g/dL (ref 6.0–8.3)

## 2021-05-27 LAB — MAGNESIUM: Magnesium: 2.1 mg/dL (ref 1.5–2.5)

## 2021-05-27 LAB — LIPID PANEL
Cholesterol: 150 mg/dL (ref 0–200)
HDL: 52.5 mg/dL (ref >39.00)
LDL Cholesterol: 86 mg/dL (ref 0–99)
NonHDL: 97.31
Total CHOL/HDL Ratio: 3
Triglycerides: 57 mg/dL (ref 0.0–149.0)
VLDL: 11.4 mg/dL (ref 0.0–40.0)

## 2021-05-28 ENCOUNTER — Other Ambulatory Visit (INDEPENDENT_AMBULATORY_CARE_PROVIDER_SITE_OTHER): Payer: 59

## 2021-05-28 ENCOUNTER — Other Ambulatory Visit: Payer: Self-pay | Admitting: Family Medicine

## 2021-05-28 DIAGNOSIS — E611 Iron deficiency: Secondary | ICD-10-CM

## 2021-05-28 LAB — URINALYSIS, MICROSCOPIC ONLY

## 2021-05-28 NOTE — Progress Notes (Signed)
Urine

## 2021-05-31 NOTE — Addendum Note (Signed)
Addended by: Mervin Kung A on: 05/31/2021 01:19 PM   Modules accepted: Orders

## 2021-06-01 ENCOUNTER — Other Ambulatory Visit (INDEPENDENT_AMBULATORY_CARE_PROVIDER_SITE_OTHER): Payer: 59

## 2021-06-01 DIAGNOSIS — E611 Iron deficiency: Secondary | ICD-10-CM | POA: Diagnosis not present

## 2021-06-01 LAB — FECAL OCCULT BLOOD, IMMUNOCHEMICAL: Fecal Occult Bld: NEGATIVE

## 2021-06-02 ENCOUNTER — Other Ambulatory Visit (HOSPITAL_COMMUNITY): Payer: Self-pay

## 2021-06-02 ENCOUNTER — Other Ambulatory Visit (HOSPITAL_BASED_OUTPATIENT_CLINIC_OR_DEPARTMENT_OTHER): Payer: Self-pay

## 2021-06-02 ENCOUNTER — Other Ambulatory Visit: Payer: Self-pay | Admitting: Family Medicine

## 2021-06-02 MED ORDER — OXYCODONE HCL 5 MG PO TABS
2.5000 mg | ORAL_TABLET | Freq: Four times a day (QID) | ORAL | 0 refills | Status: DC | PRN
  Filled 2021-06-02: qty 15, 4d supply, fill #0

## 2021-06-02 MED ORDER — LIDOCAINE 5 % EX PTCH
1.0000 | MEDICATED_PATCH | CUTANEOUS | 0 refills | Status: DC
  Filled 2021-06-02: qty 30, 30d supply, fill #0

## 2021-06-09 ENCOUNTER — Other Ambulatory Visit (HOSPITAL_BASED_OUTPATIENT_CLINIC_OR_DEPARTMENT_OTHER): Payer: Self-pay

## 2021-06-09 MED FILL — Metaxalone Tab 800 MG: ORAL | 14 days supply | Qty: 40 | Fill #1 | Status: CN

## 2021-06-11 ENCOUNTER — Other Ambulatory Visit (HOSPITAL_BASED_OUTPATIENT_CLINIC_OR_DEPARTMENT_OTHER): Payer: Self-pay

## 2021-06-11 MED FILL — Metaxalone Tab 800 MG: ORAL | 14 days supply | Qty: 40 | Fill #1 | Status: CN

## 2021-06-11 MED FILL — Metaxalone Tab 800 MG: ORAL | 14 days supply | Qty: 40 | Fill #1 | Status: AC

## 2021-06-24 ENCOUNTER — Other Ambulatory Visit (HOSPITAL_BASED_OUTPATIENT_CLINIC_OR_DEPARTMENT_OTHER): Payer: Self-pay

## 2021-06-24 MED FILL — Meloxicam Tab 15 MG: ORAL | 60 days supply | Qty: 60 | Fill #0 | Status: AC

## 2021-07-21 ENCOUNTER — Other Ambulatory Visit (HOSPITAL_BASED_OUTPATIENT_CLINIC_OR_DEPARTMENT_OTHER): Payer: Self-pay

## 2021-07-21 ENCOUNTER — Other Ambulatory Visit: Payer: Self-pay | Admitting: Family Medicine

## 2021-07-21 DIAGNOSIS — Z20822 Contact with and (suspected) exposure to covid-19: Secondary | ICD-10-CM | POA: Diagnosis not present

## 2021-07-21 MED ORDER — PHENTERMINE HCL 37.5 MG PO CAPS
37.5000 mg | ORAL_CAPSULE | ORAL | 0 refills | Status: DC
  Filled 2021-07-21: qty 30, 30d supply, fill #0

## 2021-07-22 ENCOUNTER — Other Ambulatory Visit (HOSPITAL_BASED_OUTPATIENT_CLINIC_OR_DEPARTMENT_OTHER): Payer: Self-pay

## 2021-07-22 MED ORDER — CARESTART COVID-19 HOME TEST VI KIT
PACK | 0 refills | Status: DC
  Filled 2021-07-22: qty 2, 4d supply, fill #0

## 2021-08-02 ENCOUNTER — Other Ambulatory Visit (HOSPITAL_BASED_OUTPATIENT_CLINIC_OR_DEPARTMENT_OTHER): Payer: Self-pay

## 2021-08-02 MED ORDER — CARESTART COVID-19 HOME TEST VI KIT
PACK | 0 refills | Status: DC
  Filled 2021-08-02: qty 2, 4d supply, fill #0

## 2021-08-10 ENCOUNTER — Other Ambulatory Visit (HOSPITAL_BASED_OUTPATIENT_CLINIC_OR_DEPARTMENT_OTHER): Payer: Self-pay

## 2021-08-10 MED FILL — Meloxicam Tab 15 MG: ORAL | 60 days supply | Qty: 60 | Fill #1 | Status: AC

## 2021-08-10 MED FILL — Metaxalone Tab 800 MG: ORAL | 14 days supply | Qty: 40 | Fill #2 | Status: AC

## 2021-08-11 ENCOUNTER — Other Ambulatory Visit (HOSPITAL_BASED_OUTPATIENT_CLINIC_OR_DEPARTMENT_OTHER): Payer: Self-pay

## 2021-08-17 ENCOUNTER — Other Ambulatory Visit (HOSPITAL_BASED_OUTPATIENT_CLINIC_OR_DEPARTMENT_OTHER): Payer: Self-pay

## 2021-08-17 MED FILL — Trazodone HCl Tab 50 MG: ORAL | 90 days supply | Qty: 90 | Fill #0 | Status: AC

## 2021-09-07 ENCOUNTER — Other Ambulatory Visit: Payer: Self-pay | Admitting: Family Medicine

## 2021-09-07 ENCOUNTER — Other Ambulatory Visit (HOSPITAL_BASED_OUTPATIENT_CLINIC_OR_DEPARTMENT_OTHER): Payer: Self-pay

## 2021-09-07 MED ORDER — PHENTERMINE HCL 37.5 MG PO CAPS
37.5000 mg | ORAL_CAPSULE | ORAL | 0 refills | Status: DC
  Filled 2021-09-07: qty 30, 30d supply, fill #0

## 2021-09-11 MED FILL — Metaxalone Tab 800 MG: ORAL | 14 days supply | Qty: 40 | Fill #3 | Status: AC

## 2021-09-13 ENCOUNTER — Other Ambulatory Visit (HOSPITAL_BASED_OUTPATIENT_CLINIC_OR_DEPARTMENT_OTHER): Payer: Self-pay

## 2021-09-16 ENCOUNTER — Other Ambulatory Visit (HOSPITAL_BASED_OUTPATIENT_CLINIC_OR_DEPARTMENT_OTHER): Payer: Self-pay

## 2021-09-16 MED ORDER — TOBRAMYCIN-DEXAMETHASONE 0.3-0.1 % OP SUSP
1.0000 [drp] | Freq: Three times a day (TID) | OPHTHALMIC | 1 refills | Status: DC
  Filled 2021-09-16: qty 5, 25d supply, fill #0

## 2021-09-27 ENCOUNTER — Other Ambulatory Visit: Payer: Self-pay

## 2021-09-28 ENCOUNTER — Telehealth: Payer: Self-pay | Admitting: Family Medicine

## 2021-09-28 ENCOUNTER — Other Ambulatory Visit (HOSPITAL_BASED_OUTPATIENT_CLINIC_OR_DEPARTMENT_OTHER): Payer: Self-pay

## 2021-09-28 ENCOUNTER — Ambulatory Visit (INDEPENDENT_AMBULATORY_CARE_PROVIDER_SITE_OTHER): Payer: 59 | Admitting: Family Medicine

## 2021-09-28 ENCOUNTER — Encounter: Payer: Self-pay | Admitting: Family Medicine

## 2021-09-28 VITALS — BP 112/76 | HR 67 | Temp 98.0°F | Ht 65.0 in | Wt 178.4 lb

## 2021-09-28 DIAGNOSIS — G4700 Insomnia, unspecified: Secondary | ICD-10-CM | POA: Diagnosis not present

## 2021-09-28 DIAGNOSIS — E785 Hyperlipidemia, unspecified: Secondary | ICD-10-CM | POA: Diagnosis not present

## 2021-09-28 DIAGNOSIS — E559 Vitamin D deficiency, unspecified: Secondary | ICD-10-CM | POA: Diagnosis not present

## 2021-09-28 DIAGNOSIS — E611 Iron deficiency: Secondary | ICD-10-CM

## 2021-09-28 DIAGNOSIS — Z0000 Encounter for general adult medical examination without abnormal findings: Secondary | ICD-10-CM

## 2021-09-28 LAB — COMPREHENSIVE METABOLIC PANEL
ALT: 22 U/L (ref 0–35)
AST: 23 U/L (ref 0–37)
Albumin: 4.1 g/dL (ref 3.5–5.2)
Alkaline Phosphatase: 97 U/L (ref 39–117)
BUN: 14 mg/dL (ref 6–23)
CO2: 30 mEq/L (ref 19–32)
Calcium: 8.8 mg/dL (ref 8.4–10.5)
Chloride: 103 mEq/L (ref 96–112)
Creatinine, Ser: 0.63 mg/dL (ref 0.40–1.20)
GFR: 97.65 mL/min (ref >60.00)
Glucose, Bld: 88 mg/dL (ref 70–99)
Potassium: 4.3 mEq/L (ref 3.5–5.1)
Sodium: 139 mEq/L (ref 135–145)
Total Bilirubin: 0.4 mg/dL (ref 0.2–1.2)
Total Protein: 6.5 g/dL (ref 6.0–8.3)

## 2021-09-28 LAB — IBC + FERRITIN
Ferritin: 7.3 ng/mL — ABNORMAL LOW (ref 10.0–291.0)
Iron: 45 ug/dL (ref 42–145)
Saturation Ratios: 9.8 % — ABNORMAL LOW (ref 20.0–50.0)
TIBC: 460.6 ug/dL — ABNORMAL HIGH (ref 250.0–450.0)
Transferrin: 329 mg/dL (ref 212.0–360.0)

## 2021-09-28 LAB — LIPID PANEL
Cholesterol: 179 mg/dL (ref 0–200)
HDL: 62.5 mg/dL (ref >39.00)
LDL Cholesterol: 104 mg/dL — ABNORMAL HIGH (ref 0–99)
NonHDL: 116.63
Total CHOL/HDL Ratio: 3
Triglycerides: 65 mg/dL (ref 0.0–149.0)
VLDL: 13 mg/dL (ref 0.0–40.0)

## 2021-09-28 LAB — CBC
HCT: 37.5 % (ref 36.0–46.0)
Hemoglobin: 12 g/dL (ref 12.0–15.0)
MCHC: 32.1 g/dL (ref 30.0–36.0)
MCV: 81.2 fl (ref 78.0–100.0)
Platelets: 308 10*3/uL (ref 150.0–400.0)
RBC: 4.62 Mil/uL (ref 3.87–5.11)
RDW: 19.1 % — ABNORMAL HIGH (ref 11.5–15.5)
WBC: 5.2 10*3/uL (ref 4.0–10.5)

## 2021-09-28 LAB — VITAMIN D 25 HYDROXY (VIT D DEFICIENCY, FRACTURES): VITD: 109.8 ng/mL (ref 30.00–100.00)

## 2021-09-28 MED ORDER — ATORVASTATIN CALCIUM 40 MG PO TABS
40.0000 mg | ORAL_TABLET | Freq: Every day | ORAL | 3 refills | Status: DC
  Filled 2021-09-28: qty 90, 90d supply, fill #0

## 2021-09-28 MED ORDER — TRAZODONE HCL 50 MG PO TABS
25.0000 mg | ORAL_TABLET | Freq: Every evening | ORAL | 2 refills | Status: DC | PRN
  Filled 2021-09-28 – 2021-11-04 (×2): qty 90, 90d supply, fill #0
  Filled 2022-05-18: qty 90, 90d supply, fill #1

## 2021-09-28 NOTE — Progress Notes (Signed)
Chief Complaint  Patient presents with   Annual Exam     Well Woman Kristie Martinez is here for a complete physical.   Her last physical was >1 year ago.  Current diet: in general, a "healthy" diet. Current exercise: walking, elliptical, cycling, wt resistance exercise. Weight is intentionally decreasing and she denies fatigue out of ordinary. Seatbelt? Yes  Health Maintenance Pap/HPV- Yes Mammogram- Yes Colon cancer screening-Yes Shingrix- No Tetanus- Yes Hep C screening- Yes HIV screening- Yes  Past Medical History:  Diagnosis Date   Anemia    Hamstring injury    Hx of gastric bypass 2004   Indigestion    Obesity    Post-operative nausea and vomiting    Varicose veins      Past Surgical History:  Procedure Laterality Date   CERVICAL DISC ARTHROPLASTY  2019   ENDOMETRIAL ABLATION  2004   ENDOVENOUS ABLATION SAPHENOUS VEIN W/ LASER  09-27-2012   left greater saphenous vein  by Curt Jews MD   ENDOVENOUS ABLATION SAPHENOUS VEIN W/ LASER  11-08-2012   right greater saphenous vein   by Curt Jews MD   mini gastric bypass   2004   OTHER SURGICAL HISTORY  2004   mini gastric bypass   TONSILLECTOMY     TONSILLECTOMY AND ADENOIDECTOMY  1970    Medications  Current Outpatient Medications on File Prior to Visit  Medication Sig Dispense Refill   aspirin EC 81 MG EC tablet Take 1 tablet (81 mg total) by mouth daily. Swallow whole. 30 tablet 11   calcium carbonate (TUMS - DOSED IN MG ELEMENTAL CALCIUM) 500 MG chewable tablet Chew 1 tablet by mouth daily.     cholecalciferol (VITAMIN D) 1000 units tablet Take 5,000 Units by mouth daily.      COVID-19 At Home Antigen Test [facility] COVID-19 HOME TEST) KIT Use as directed per package instructions. 2 kit 0   COVID-19 At Home Antigen Test KIT USE AS DIRECTED 4 kit 0   COVID-19 mRNA Vac-TriS, Pfizer, (PFIZER-BIONT COVID-19 VAC-TRIS) SUSP injection Inject into the muscle. 0.3 mL 0   Cyanocobalamin (VITAMIN B-12 PO) Take 5,000  Units by mouth daily.      diphenhydrAMINE (BENADRYL) 25 mg capsule Take 25 mg by mouth every 6 (six) hours as needed for sleep.      docusate sodium (COLACE) 100 MG capsule Take 100 mg by mouth 2 (two) times daily as needed for mild constipation.      lidocaine (LIDODERM) 5 % Place 1 patch onto the skin daily. Remove & Discard patch within 12 hours or as directed by MD. 30 patch 0   Magnesium 400 MG TABS Take 800 mg by mouth in the morning and at bedtime.      meloxicam (MOBIC) 15 MG tablet TAKE 1 TABLET BY MOUTH ONCE DAILY 60 tablet 5   methylcellulose oral powder Take 1 packet by mouth in the morning and at bedtime.      Multiple Vitamin (MULTIVITAMIN WITH MINERALS) TABS tablet Take 1 tablet by mouth daily.     phentermine 37.5 MG capsule Take 1 capsule (37.5 mg total) by mouth every morning. 30 capsule 0   PRESCRIPTION MEDICATION Apply 1-2 Pump topically as needed. Cyclobenzaprine 2%/Diclofenac 3% cream - 3-4x daily PRN     tobramycin-dexamethasone (TOBRADEX) ophthalmic solution Place 1 drop into both eyes 3 (three) times daily for 7 days, then 1 drop into both eyes 2 (two) times daily for 7 days, and then 1 drop into  both eyes daily until finished.. 5 mL 1   traZODone (DESYREL) 50 MG tablet TAKE 1/2 - 1 TABLET (25-50 MG TOTAL) BY MOUTH AT BEDTIME AS NEEDED FOR SLEEP. 90 tablet 2   vitamin k 100 MCG tablet Take 50 mcg by mouth daily.     Allergies No Known Allergies  Review of Systems: Constitutional:  no unexpected weight changes Eye:  no recent significant change in vision Ear/Nose/Mouth/Throat:  Ears:  no recent change in hearing Nose/Mouth/Throat:  no complaints of nasal congestion, no sore throat Cardiovascular: no chest pain Respiratory:  no shortness of breath Gastrointestinal:  no abdominal pain, no change in bowel habits GU:  Female: negative for dysuria or pelvic pain Musculoskeletal/Extremities:  no new pain of the joints Integumentary (Skin/Breast):  no abnormal skin  lesions reported Neurologic:  no headaches Endocrine:  denies fatigue  Exam BP 112/76   Pulse 67   Temp 98 F (36.7 C) (Oral)   Ht 5' 5"  (1.651 m)   Wt 178 lb 6 oz (80.9 kg)   SpO2 99%   BMI 29.68 kg/m  General:  well developed, well nourished, in no apparent distress Skin:  no significant moles, warts, or growths Head:  no masses, lesions, or tenderness Eyes:  pupils equal and round, sclera anicteric without injection Ears:  canals without lesions, TMs shiny without retraction, no obvious effusion, no erythema Nose:  nares patent, septum midline, mucosa normal, and no drainage or sinus tenderness Throat/Pharynx:  lips and gingiva without lesion; tongue and uvula midline; non-inflamed pharynx; no exudates or postnasal drainage Neck: neck supple without adenopathy, thyromegaly, or masses Lungs:  clear to auscultation, breath sounds equal bilaterally, no respiratory distress Cardio:  regular rate and rhythm, no LE edema Abdomen:  abdomen soft, nontender; bowel sounds normal; no masses or organomegaly Genital: Defer to GYN Musculoskeletal:  symmetrical muscle groups noted without atrophy or deformity Extremities:  no clubbing, cyanosis, or edema, no deformities, no skin discoloration Neuro:  gait normal; deep tendon reflexes normal and symmetric Psych: well oriented with normal range of affect and appropriate judgment/insight  Assessment and Plan  Well adult exam - Plan: CBC, Comprehensive metabolic panel, Lipid panel  Hyperlipidemia, unspecified hyperlipidemia type  Low iron - Plan: IBC + Ferritin  Vitamin D deficiency - Plan: VITAMIN D 25 Hydroxy (Vit-D Deficiency, Fractures)  Insomnia, unspecified type - Plan: traZODone (DESYREL) 50 MG tablet   Well 60 y.o. female. Counseled on diet and exercise. Other orders as above. Restart statin.  Follow up in 6 mo. The patient voiced understanding and agreement to the plan.  [place], DO 09/28/21 11:32 AM

## 2021-09-28 NOTE — Telephone Encounter (Signed)
PCP response. Stop any Vit D supplementation for now and DC any if on med. List. All done.patient informed

## 2021-09-28 NOTE — Telephone Encounter (Signed)
Received critical for Vitamin D--109.8 Will notify PCP.

## 2021-09-28 NOTE — Patient Instructions (Addendum)
Give us 2-3 business days to get the results of your labs back.   Keep the diet clean and stay active.  The new Shingrix vaccine (for shingles) is a 2 shot series. It can make people feel low energy, achy and almost like they have the flu for 48 hours after injection. Please plan accordingly when deciding on when to get this shot. Call our office for a nurse visit appointment to get this. The second shot of the series is less severe regarding the side effects, but it still lasts 48 hours.   I recommend getting the updated bivalent covid vaccination booster at your convenience.   Let us know if you need anything. 

## 2021-09-29 ENCOUNTER — Other Ambulatory Visit: Payer: Self-pay | Admitting: Family Medicine

## 2021-09-29 DIAGNOSIS — R7989 Other specified abnormal findings of blood chemistry: Secondary | ICD-10-CM

## 2021-09-29 NOTE — Progress Notes (Signed)
Vitamin D

## 2021-10-12 ENCOUNTER — Other Ambulatory Visit (HOSPITAL_BASED_OUTPATIENT_CLINIC_OR_DEPARTMENT_OTHER): Payer: Self-pay

## 2021-10-12 ENCOUNTER — Encounter: Payer: Self-pay | Admitting: Family Medicine

## 2021-10-12 ENCOUNTER — Other Ambulatory Visit: Payer: Self-pay | Admitting: Family Medicine

## 2021-10-12 ENCOUNTER — Other Ambulatory Visit: Payer: 59

## 2021-10-12 ENCOUNTER — Ambulatory Visit: Payer: 59 | Admitting: Family Medicine

## 2021-10-12 ENCOUNTER — Other Ambulatory Visit: Payer: Self-pay

## 2021-10-12 VITALS — BP 120/82 | HR 62 | Temp 98.0°F | Ht 66.0 in | Wt 179.0 lb

## 2021-10-12 DIAGNOSIS — R7989 Other specified abnormal findings of blood chemistry: Secondary | ICD-10-CM

## 2021-10-12 DIAGNOSIS — E669 Obesity, unspecified: Secondary | ICD-10-CM | POA: Diagnosis not present

## 2021-10-12 LAB — VITAMIN D 25 HYDROXY (VIT D DEFICIENCY, FRACTURES): VITD: 96.41 ng/mL (ref 30.00–100.00)

## 2021-10-12 MED ORDER — OZEMPIC (0.25 OR 0.5 MG/DOSE) 2 MG/1.5ML ~~LOC~~ SOPN
PEN_INJECTOR | SUBCUTANEOUS | 1 refills | Status: AC
  Filled 2021-10-12: qty 1.5, 42d supply, fill #0
  Filled 2021-11-20: qty 1.5, 28d supply, fill #1
  Filled 2021-12-17: qty 1.5, 28d supply, fill #2

## 2021-10-12 NOTE — Patient Instructions (Addendum)
Let me know if the medicine isn't covered.  Let us know if you need anything.  Keep the diet clean and stay active.

## 2021-10-12 NOTE — Progress Notes (Signed)
Chief Complaint  Patient presents with   Weight Loss    Subjective: Patient is a 60 y.o. female here for wt loss.  Pt interested in losing weight. Tried Phentermine which helped, but came off due to being on it for 3 mo. Diet/exercise is sound. No Cp or SOB.   Past Medical History:  Diagnosis Date   Anemia    Hamstring injury    Hx of gastric bypass 2004   Indigestion    Obesity    Post-operative nausea and vomiting    Varicose veins     Objective: BP 120/82   Pulse 62   Temp 98 F (36.7 C) (Oral)   Ht 5\' 6"  (1.676 m)   Wt 179 lb (81.2 kg)   SpO2 99%   BMI 28.89 kg/m  General: Awake, appears stated age Heart: RRR, no LE edema Lungs: CTAB, no rales, wheezes or rhonchi. No accessory muscle use Psych: Age appropriate judgment and insight, normal affect and mood  Assessment and Plan: Obesity (BMI 30-39.9) - Plan: Semaglutide,0.25 or 0.5MG /DOS, (OZEMPIC, 0.25 OR 0.5 MG/DOSE,) 2 MG/1.5ML SOPN  High serum vitamin D - Plan: VITAMIN D 25 Hydroxy (Vit-D Deficiency, Fractures)  Chronic, not currently controlled. Will see if semaglutide is covered, if not will try formulary alt like Saxenda. Wegovy on .  F/u in 5 weeks.  The patient voiced understanding and agreement to the plan.  Sport and exercise psychologist Anna [place], DO 10/12/21  8:16 AM

## 2021-10-21 IMAGING — CR DG CHEST 2V
2 series · 2 of 2 positions shown · non-contrast
Comparison: November 17, 2016

CLINICAL DATA: Left upper extremity numbness and syncope

EXAM:
CHEST - 2 VIEW

[chest pa]
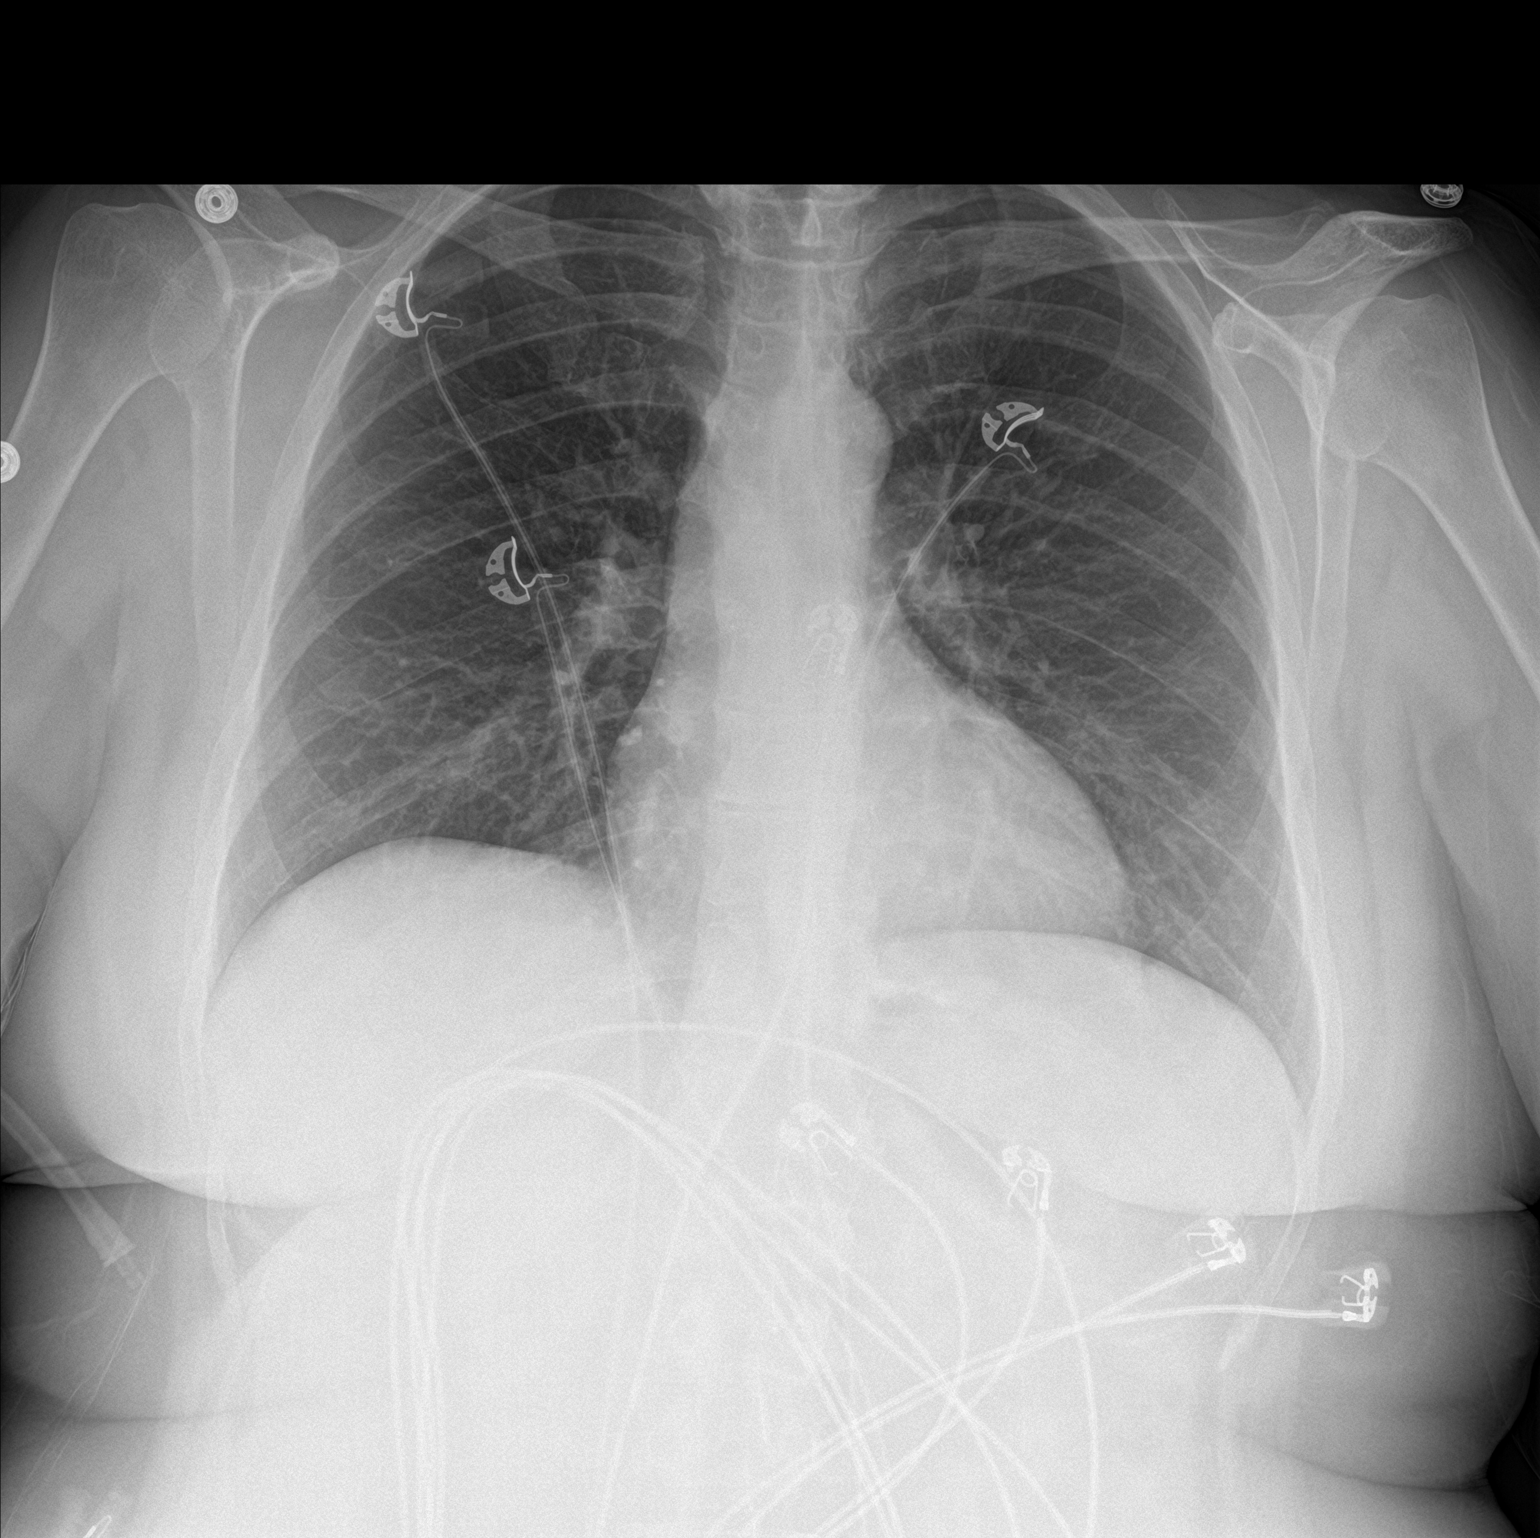

[chest lat]
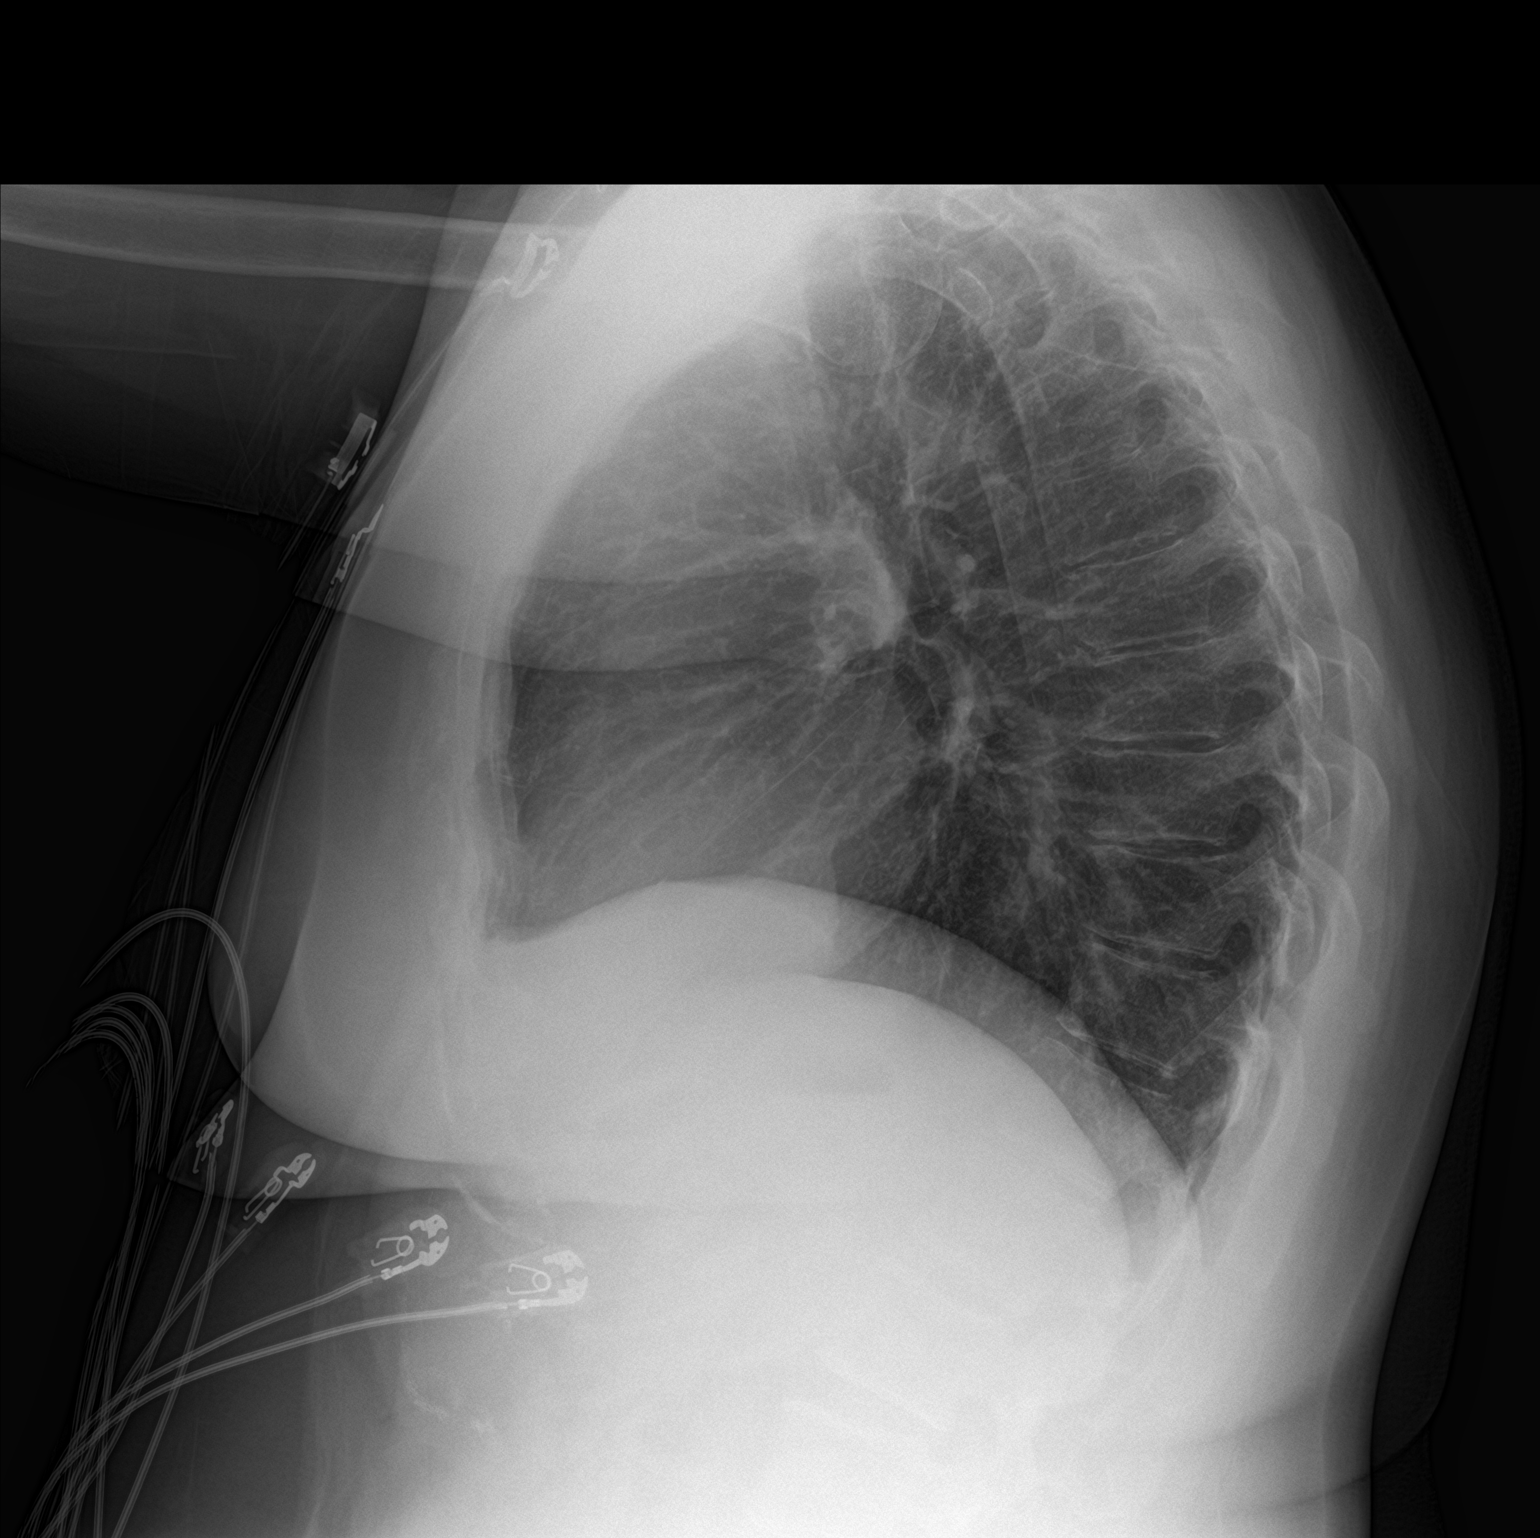

[2 of 2 positions shown; findings below may reference images not displayed]

FINDINGS: Lungs are clear. Heart size and pulmonary vascularity are normal. No
adenopathy. Postoperative changes noted in the lower cervical spine.
IMPRESSION: Lungs clear.  Cardiac silhouette within normal limits.

## 2021-10-25 ENCOUNTER — Other Ambulatory Visit (HOSPITAL_BASED_OUTPATIENT_CLINIC_OR_DEPARTMENT_OTHER): Payer: Self-pay

## 2021-10-25 MED ORDER — CARESTART COVID-19 HOME TEST VI KIT
PACK | 0 refills | Status: DC
Start: 2021-10-25 — End: 2021-12-17
  Filled 2021-10-25: qty 4, 4d supply, fill #0

## 2021-11-04 ENCOUNTER — Other Ambulatory Visit (HOSPITAL_BASED_OUTPATIENT_CLINIC_OR_DEPARTMENT_OTHER): Payer: Self-pay

## 2021-11-04 ENCOUNTER — Encounter: Payer: Self-pay | Admitting: Family Medicine

## 2021-11-04 MED FILL — Meloxicam Tab 15 MG: ORAL | 60 days supply | Qty: 60 | Fill #2 | Status: AC

## 2021-11-08 ENCOUNTER — Other Ambulatory Visit (HOSPITAL_BASED_OUTPATIENT_CLINIC_OR_DEPARTMENT_OTHER): Payer: Self-pay

## 2021-11-08 MED ORDER — METAXALONE 800 MG PO TABS
ORAL_TABLET | ORAL | 0 refills | Status: DC
  Filled 2021-11-08: qty 40, 14d supply, fill #0

## 2021-11-12 ENCOUNTER — Other Ambulatory Visit (HOSPITAL_BASED_OUTPATIENT_CLINIC_OR_DEPARTMENT_OTHER): Payer: Self-pay

## 2021-11-12 ENCOUNTER — Ambulatory Visit: Payer: 59 | Admitting: Family Medicine

## 2021-11-12 ENCOUNTER — Encounter: Payer: Self-pay | Admitting: Family Medicine

## 2021-11-12 VITALS — BP 112/78 | HR 63 | Temp 97.6°F | Ht 66.0 in | Wt 180.1 lb

## 2021-11-12 DIAGNOSIS — Z23 Encounter for immunization: Secondary | ICD-10-CM

## 2021-11-12 DIAGNOSIS — E669 Obesity, unspecified: Secondary | ICD-10-CM | POA: Diagnosis not present

## 2021-11-12 MED ORDER — PHENTERMINE HCL 37.5 MG PO CAPS
37.5000 mg | ORAL_CAPSULE | ORAL | 0 refills | Status: DC
  Filled 2021-11-12: qty 30, 30d supply, fill #0

## 2021-11-12 NOTE — Progress Notes (Signed)
Chief Complaint  Patient presents with   Medical Management of Chronic Issues    5 week ozempic for weight loss    Subjective: Patient is a 60 y.o. female here for f/u weight loss.  Around 1 mo ago, pt was placed on Ozempic 0.25 mg/week. Since that time, she reports no changes since starting. Reports compliance, no AE's. Eating healthy overall.   Past Medical History:  Diagnosis Date   Anemia    Hamstring injury    Hx of gastric bypass 2004   Indigestion    Obesity    Post-operative nausea and vomiting    Varicose veins     Objective: BP 112/78    Pulse 63    Temp 97.6 F (36.4 C) (Oral)    Ht 5\' 6"  (1.676 m)    Wt 180 lb 1.6 oz (81.7 kg)    SpO2 99%    BMI 29.07 kg/m  General: Awake, appears stated age Heart: RRR, no LE edema Lungs: CTAB, no rales, wheezes or rhonchi. No accessory muscle use Psych: Age appropriate judgment and insight, normal affect and mood  Assessment and Plan: Obesity (BMI 30-39.9)  BMI 27-30 w GERD. Will increase to Ozempic 0.5 mg/week and see how she does. Counseled on diet/exercise. The patient voiced understanding and agreement to the plan.  [place], DO 11/12/21  7:11 AM

## 2021-11-12 NOTE — Addendum Note (Signed)
Addended by: Lisbeth Renshaw, Lynford Espinoza HUA on: 11/12/2021 07:32 AM   Modules accepted: Orders

## 2021-11-12 NOTE — Patient Instructions (Signed)
Keep the diet clean and stay active.  Let's go up to 0.5 mg/week for a few doses and see how things go. OK to take 0.25 today and go to 0.5 mg next Th.   Let us know if you need anything.

## 2021-11-22 ENCOUNTER — Other Ambulatory Visit (HOSPITAL_BASED_OUTPATIENT_CLINIC_OR_DEPARTMENT_OTHER): Payer: Self-pay

## 2021-12-15 ENCOUNTER — Encounter: Payer: Self-pay | Admitting: Family Medicine

## 2021-12-15 ENCOUNTER — Other Ambulatory Visit: Payer: Self-pay | Admitting: Family Medicine

## 2021-12-15 MED FILL — Meloxicam Tab 15 MG: ORAL | 60 days supply | Qty: 60 | Fill #3 | Status: CN

## 2021-12-16 ENCOUNTER — Other Ambulatory Visit (HOSPITAL_BASED_OUTPATIENT_CLINIC_OR_DEPARTMENT_OTHER): Payer: Self-pay

## 2021-12-16 MED ORDER — PHENTERMINE HCL 37.5 MG PO CAPS
37.5000 mg | ORAL_CAPSULE | ORAL | 0 refills | Status: DC
  Filled 2021-12-16: qty 30, 30d supply, fill #0

## 2021-12-17 ENCOUNTER — Other Ambulatory Visit: Payer: Self-pay | Admitting: Family Medicine

## 2021-12-17 ENCOUNTER — Other Ambulatory Visit (HOSPITAL_BASED_OUTPATIENT_CLINIC_OR_DEPARTMENT_OTHER): Payer: Self-pay

## 2021-12-17 DIAGNOSIS — R7989 Other specified abnormal findings of blood chemistry: Secondary | ICD-10-CM

## 2021-12-17 DIAGNOSIS — E559 Vitamin D deficiency, unspecified: Secondary | ICD-10-CM

## 2021-12-17 DIAGNOSIS — Z862 Personal history of diseases of the blood and blood-forming organs and certain disorders involving the immune mechanism: Secondary | ICD-10-CM

## 2021-12-17 MED ORDER — CARESTART COVID-19 HOME TEST VI KIT
PACK | 0 refills | Status: DC
  Filled 2021-12-17: qty 2, 4d supply, fill #0

## 2021-12-20 ENCOUNTER — Other Ambulatory Visit (INDEPENDENT_AMBULATORY_CARE_PROVIDER_SITE_OTHER): Payer: 59

## 2021-12-20 ENCOUNTER — Other Ambulatory Visit: Payer: Self-pay | Admitting: Family Medicine

## 2021-12-20 ENCOUNTER — Other Ambulatory Visit (HOSPITAL_COMMUNITY): Payer: Self-pay

## 2021-12-20 ENCOUNTER — Encounter: Payer: Self-pay | Admitting: Family Medicine

## 2021-12-20 DIAGNOSIS — H53483 Generalized contraction of visual field, bilateral: Secondary | ICD-10-CM | POA: Diagnosis not present

## 2021-12-20 DIAGNOSIS — E559 Vitamin D deficiency, unspecified: Secondary | ICD-10-CM | POA: Diagnosis not present

## 2021-12-20 DIAGNOSIS — H02422 Myogenic ptosis of left eyelid: Secondary | ICD-10-CM | POA: Diagnosis not present

## 2021-12-20 DIAGNOSIS — Z862 Personal history of diseases of the blood and blood-forming organs and certain disorders involving the immune mechanism: Secondary | ICD-10-CM | POA: Diagnosis not present

## 2021-12-20 DIAGNOSIS — H57813 Brow ptosis, bilateral: Secondary | ICD-10-CM | POA: Diagnosis not present

## 2021-12-20 DIAGNOSIS — H02412 Mechanical ptosis of left eyelid: Secondary | ICD-10-CM | POA: Diagnosis not present

## 2021-12-20 MED ORDER — MELOXICAM 15 MG PO TABS
15.0000 mg | ORAL_TABLET | Freq: Every day | ORAL | 1 refills | Status: DC
  Filled 2021-12-20 – 2021-12-29 (×2): qty 60, 60d supply, fill #0
  Filled 2022-05-18: qty 60, 60d supply, fill #1

## 2021-12-21 LAB — VITAMIN D 25 HYDROXY (VIT D DEFICIENCY, FRACTURES): VITD: 63.1 ng/mL (ref 30.00–100.00)

## 2021-12-21 LAB — CBC
HCT: 36.6 % (ref 36.0–46.0)
Hemoglobin: 11.9 g/dL — ABNORMAL LOW (ref 12.0–15.0)
MCHC: 32.4 g/dL (ref 30.0–36.0)
MCV: 88.7 fl (ref 78.0–100.0)
Platelets: 295 10*3/uL (ref 150.0–400.0)
RBC: 4.13 Mil/uL (ref 3.87–5.11)
RDW: 15.1 % (ref 11.5–15.5)
WBC: 6.5 10*3/uL (ref 4.0–10.5)

## 2021-12-22 ENCOUNTER — Other Ambulatory Visit (HOSPITAL_COMMUNITY): Payer: Self-pay

## 2021-12-29 ENCOUNTER — Other Ambulatory Visit (HOSPITAL_BASED_OUTPATIENT_CLINIC_OR_DEPARTMENT_OTHER): Payer: Self-pay

## 2021-12-29 ENCOUNTER — Other Ambulatory Visit (HOSPITAL_COMMUNITY): Payer: Self-pay

## 2021-12-29 MED ORDER — METAXALONE 800 MG PO TABS
ORAL_TABLET | ORAL | 0 refills | Status: DC
  Filled 2021-12-29: qty 40, 13d supply, fill #0

## 2022-02-04 IMAGING — MR MR CERVICAL SPINE W/O CM
4 of 5 series · 18 of 48 positions shown · non-contrast
Comparison: Prior MRI from 01/14/2008.

CLINICAL DATA: Initial evaluation for left upper extremity numbness
and tingling. History of prior cervical fusion.

EXAM:
MRI CERVICAL SPINE WITHOUT CONTRAST
TECHNIQUE: Multiplanar, multisequence MR imaging of the cervical spine was
performed. No intravenous contrast was administered.

[Series 2: T2 · sagittal · 3.0mm · 0.41mm/px · 6 of 15 slices shown (1 of 2)]
[im 1/15]
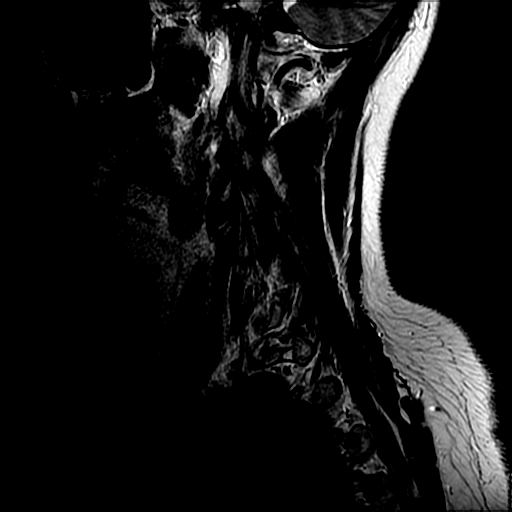
[im 3/15]
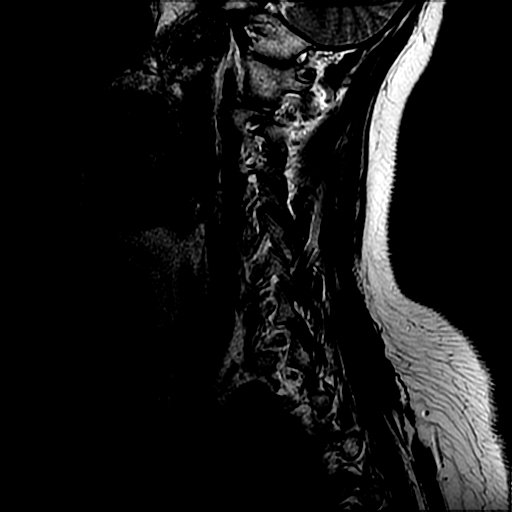
[im 6/15]
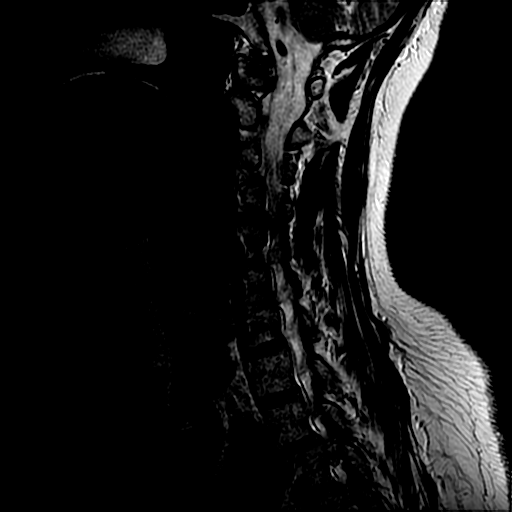
[im 9/15]
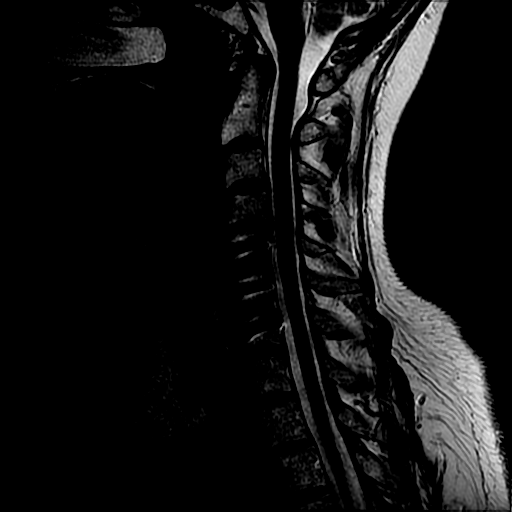
[im 12/15]
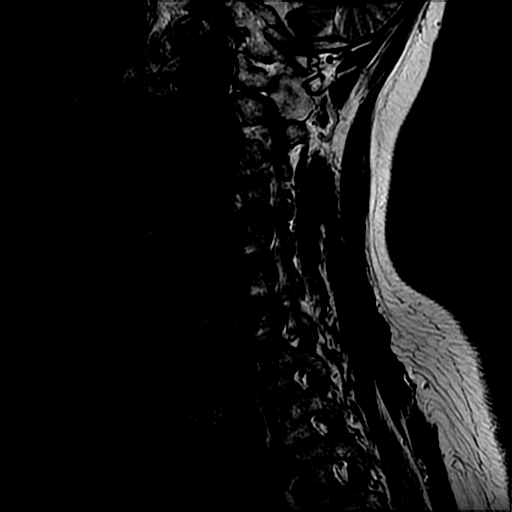
[im 15/15]
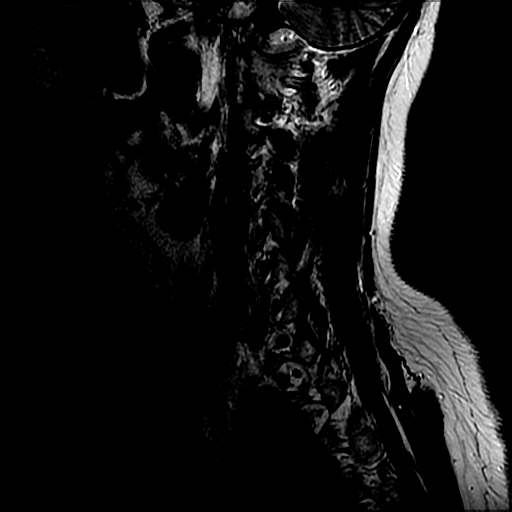

[Series 3: T1 · sagittal · 3.0mm · 0.35mm/px · 3 of 15 slices shown]
[im 3/15]
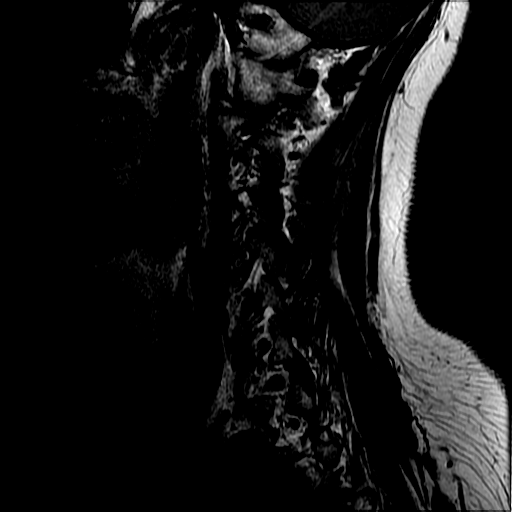
[im 9/15]
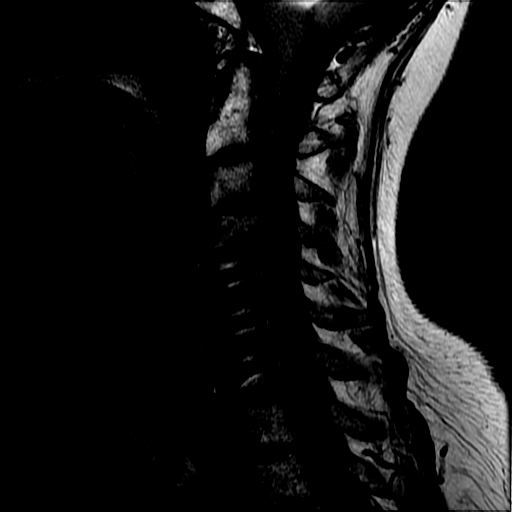
[im 15/15]
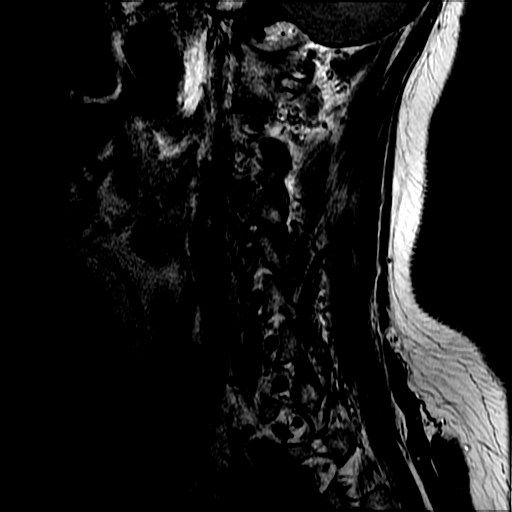

[Series 4: sag ir · sagittal · 3.0mm · 0.35mm/px · 3 of 15 slices shown]
[im 3/15]
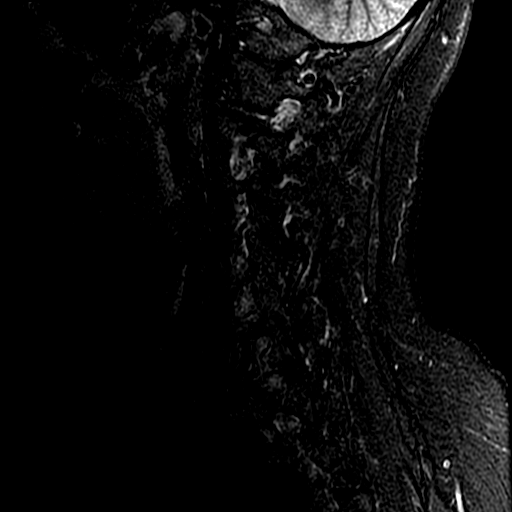
[im 9/15]
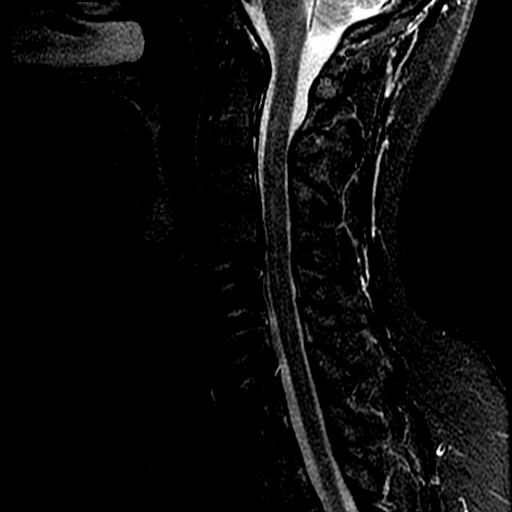
[im 15/15]
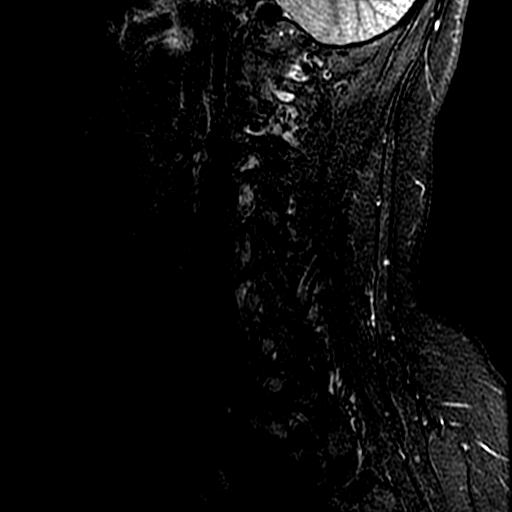

[Series 5: T2 · axial · 3.0mm · 0.39mm/px · z∈[-80,+33]mm · 6 of 40 slices shown (2 of 2)]
[im 1/40]
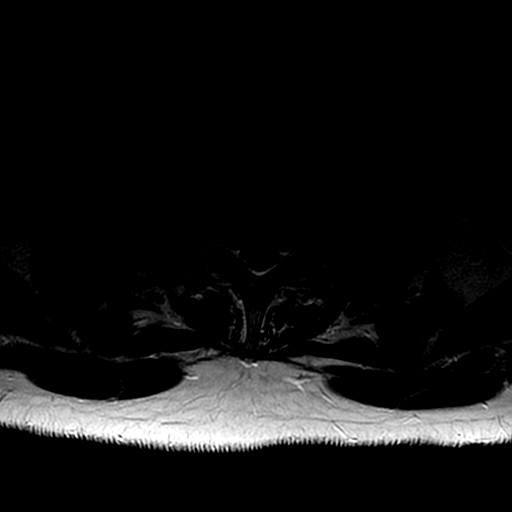
[im 6/40]
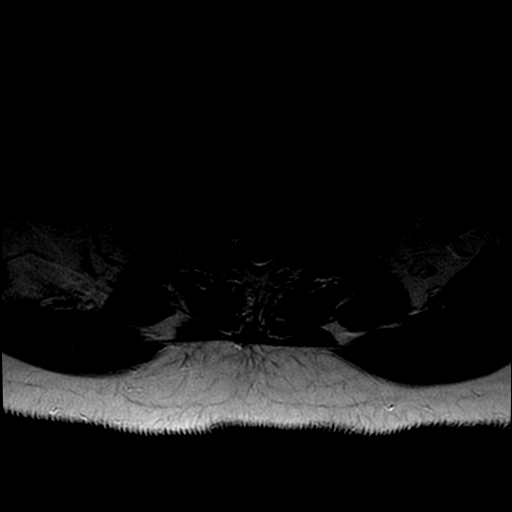
[im 12/40]
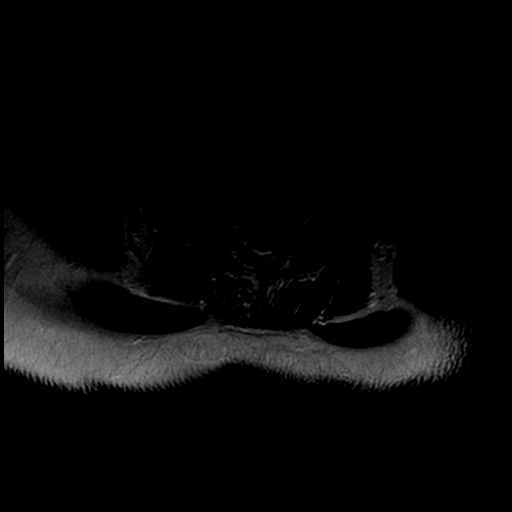
[im 17/40]
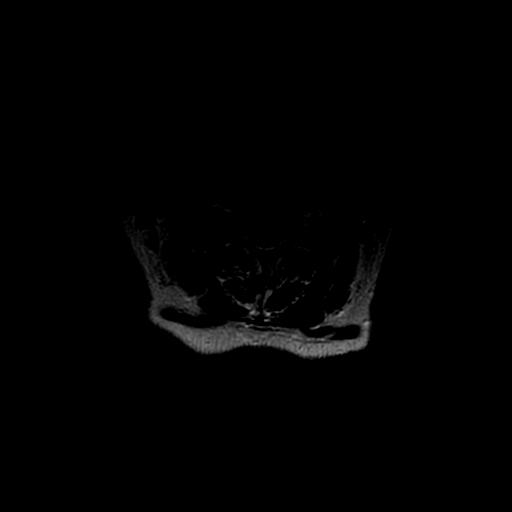
[im 20/40]
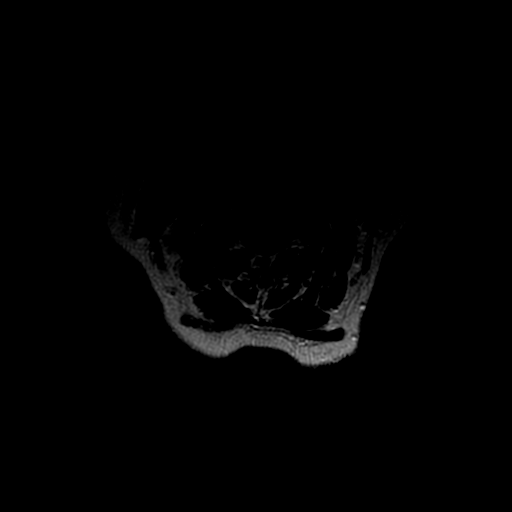
[im 34/40]
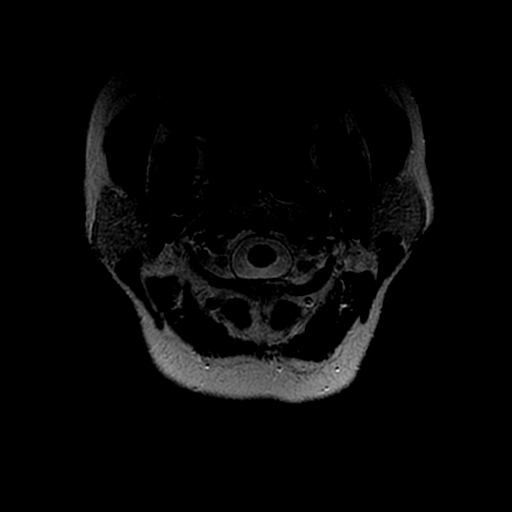

[18 of 48 positions shown; findings below may reference images not displayed]

FINDINGS: Alignment: Straightening of the normal cervical lordosis. Trace
anterolisthesis of C3 on C4, with trace retrolisthesis of C4 on C5.

Vertebrae: Susceptibility artifact related to prior fusion at C5-C7
with solid arthrodesis. Vertebral body height maintained without
acute or chronic fracture. Bone marrow signal intensity normal
without discrete or worrisome osseous lesion. No abnormal marrow
edema.

Cord: Signal intensity within the cervical spinal cord is normal.

Posterior Fossa, vertebral arteries, paraspinal tissues: Visualized
brain and posterior fossa within normal limits. Craniocervical
junction normal. Paraspinous and prevertebral soft tissues within
normal limits. Normal intravascular flow voids seen within the
vertebral arteries bilaterally.

Disc levels:

C2-C3: Normal interspace. Mild left-sided facet hypertrophy. No
canal or foraminal stenosis.

C3-C4: Trace anterolisthesis. Small central disc protrusion mildly
indents the ventral thecal sac. No significant spinal stenosis or
cord deformity. Superimposed mild uncinate spurring with resultant
mild right C4 foraminal stenosis. No significant left foraminal
narrowing.

C4-C5: Chronic intervertebral disc space narrowing with left
eccentric disc osteophyte complex. Broad posterior component
flattens and partially effaces the ventral thecal sac and resultant
mild spinal stenosis. Left greater than right uncovertebral spurring
with mild facet hypertrophy. Resultant moderate left worse than
right C5 foraminal stenosis.

C5-C6:  Prior fusion.  No residual stenosis.

C6-C7:  Prior fusion.  No residual stenosis.

C7-T1:  Unremarkable.

Visualized upper thoracic spine demonstrates no significant finding.
IMPRESSION: 1. Prior ACDF at C5-C7 with solid arthrodesis.
2. Adjacent segment disease at C4-5 with associated left eccentric
disc osteophyte complex, with resultant mild canal with moderate
left worse than right C5 foraminal stenosis. Query left C5
radiculitis.

## 2022-02-08 ENCOUNTER — Telehealth: Payer: Self-pay

## 2022-02-08 ENCOUNTER — Other Ambulatory Visit (HOSPITAL_BASED_OUTPATIENT_CLINIC_OR_DEPARTMENT_OTHER): Payer: Self-pay

## 2022-02-08 ENCOUNTER — Other Ambulatory Visit: Payer: Self-pay | Admitting: Pharmacist

## 2022-02-08 ENCOUNTER — Other Ambulatory Visit: Payer: Self-pay | Admitting: Family Medicine

## 2022-02-08 MED ORDER — METAXALONE 800 MG PO TABS
800.0000 mg | ORAL_TABLET | Freq: Three times a day (TID) | ORAL | 0 refills | Status: DC | PRN
  Filled 2022-02-08: qty 40, 14d supply, fill #0

## 2022-02-08 MED ORDER — LIDOCAINE 5 % EX PTCH
1.0000 | MEDICATED_PATCH | CUTANEOUS | 0 refills | Status: DC
  Filled 2022-02-08 – 2022-05-18 (×4): qty 30, 30d supply, fill #0

## 2022-02-08 NOTE — Telephone Encounter (Signed)
PA denied.  ? ?Lidocaine patches are only cover for diagnosis of: ?-Pain associated with post-herpetic neuralgia ?-Pain associated with diabetic neuropathy ?-Pain associated with cancer-related neuropathy (including cancer treatment-related ?neuropathy) ?

## 2022-02-08 NOTE — Telephone Encounter (Signed)
PA initiated via Covermymeds; KEY:  [id number]. Awaiting determination.  ?

## 2022-02-09 ENCOUNTER — Other Ambulatory Visit: Payer: Self-pay | Admitting: Family Medicine

## 2022-02-09 ENCOUNTER — Encounter: Payer: Self-pay | Admitting: Family Medicine

## 2022-02-09 ENCOUNTER — Other Ambulatory Visit (HOSPITAL_BASED_OUTPATIENT_CLINIC_OR_DEPARTMENT_OTHER): Payer: Self-pay

## 2022-02-09 NOTE — Telephone Encounter (Signed)
Requesting: phentermine 37.5mg  ?Contract: n/a ?UDS:n/a ?Last Visit: 11/12/2021 ?Next Visit: None ?Last Refill: 12/16/2021 #30 and 0RF ? ?Please Advise ? ?

## 2022-02-10 ENCOUNTER — Other Ambulatory Visit (HOSPITAL_BASED_OUTPATIENT_CLINIC_OR_DEPARTMENT_OTHER): Payer: Self-pay

## 2022-02-11 ENCOUNTER — Other Ambulatory Visit (HOSPITAL_BASED_OUTPATIENT_CLINIC_OR_DEPARTMENT_OTHER): Payer: Self-pay

## 2022-02-15 ENCOUNTER — Other Ambulatory Visit (HOSPITAL_BASED_OUTPATIENT_CLINIC_OR_DEPARTMENT_OTHER): Payer: Self-pay | Admitting: Family Medicine

## 2022-02-15 ENCOUNTER — Other Ambulatory Visit (HOSPITAL_BASED_OUTPATIENT_CLINIC_OR_DEPARTMENT_OTHER): Payer: Self-pay

## 2022-02-15 DIAGNOSIS — Z1231 Encounter for screening mammogram for malignant neoplasm of breast: Secondary | ICD-10-CM

## 2022-02-22 ENCOUNTER — Encounter (HOSPITAL_BASED_OUTPATIENT_CLINIC_OR_DEPARTMENT_OTHER): Payer: Self-pay

## 2022-02-22 ENCOUNTER — Ambulatory Visit (HOSPITAL_BASED_OUTPATIENT_CLINIC_OR_DEPARTMENT_OTHER)
Admission: RE | Admit: 2022-02-22 | Discharge: 2022-02-22 | Disposition: A | Discharge status: Home / Self Care | Payer: BC Managed Care – PPO | Source: Ambulatory Visit | Attending: Family Medicine | Admitting: Family Medicine

## 2022-02-22 ENCOUNTER — Ambulatory Visit: Payer: BC Managed Care – PPO | Attending: Internal Medicine

## 2022-02-22 DIAGNOSIS — Z1231 Encounter for screening mammogram for malignant neoplasm of breast: Secondary | ICD-10-CM | POA: Insufficient documentation

## 2022-02-22 DIAGNOSIS — Z23 Encounter for immunization: Secondary | ICD-10-CM

## 2022-02-22 NOTE — Progress Notes (Signed)
? ?  [facility]ov[facility] ? ?Name:  Kristie Martinez    ?MRN: [id number] ?DOB: Feb 03, 1961 ? ?02/22/2022 ? ?[name] was observed post Covid-19 immunization for 15 minutes without incident. She was provided with Vaccine Information Sheet and instruction to access the V-Safe system.  ? ?[name] was instructed to call 911 with any severe reactions post vaccine: ?Difficulty breathing  ?Swelling of face and throat  ?A fast heartbeat  ?A bad rash all over body  ?Dizziness and weakness  ? ?Immunizations Administered   ? ? Name Date Dose VIS Date Route  ? Art gallery manager Booster 02/22/2022  9:45 AM 0.3 mL 07/21/2021 Intramuscular  ? Manufacturer: [employer]  ? Lot: [id number]  ? NDC: [id number]  ? ?  ? ? ?

## 2022-02-23 ENCOUNTER — Other Ambulatory Visit (HOSPITAL_BASED_OUTPATIENT_CLINIC_OR_DEPARTMENT_OTHER): Payer: Self-pay

## 2022-02-23 ENCOUNTER — Telehealth: Payer: Self-pay

## 2022-02-23 ENCOUNTER — Encounter: Payer: Self-pay | Admitting: Family Medicine

## 2022-02-23 ENCOUNTER — Ambulatory Visit (INDEPENDENT_AMBULATORY_CARE_PROVIDER_SITE_OTHER): Payer: BC Managed Care – PPO | Admitting: Family Medicine

## 2022-02-23 ENCOUNTER — Other Ambulatory Visit: Payer: Self-pay | Admitting: Family Medicine

## 2022-02-23 VITALS — BP 104/68 | HR 87 | Temp 98.0°F | Ht 65.0 in | Wt 186.4 lb

## 2022-02-23 DIAGNOSIS — Z23 Encounter for immunization: Secondary | ICD-10-CM

## 2022-02-23 DIAGNOSIS — E669 Obesity, unspecified: Secondary | ICD-10-CM | POA: Diagnosis not present

## 2022-02-23 MED ORDER — SEMAGLUTIDE-WEIGHT MANAGEMENT 1 MG/0.5ML ~~LOC~~ SOAJ
1.0000 mg | SUBCUTANEOUS | 0 refills | Status: AC
  Filled 2022-02-23 – 2022-06-13 (×2): qty 2, 28d supply, fill #0

## 2022-02-23 MED ORDER — SEMAGLUTIDE-WEIGHT MANAGEMENT 2.4 MG/0.75ML ~~LOC~~ SOAJ
2.4000 mg | SUBCUTANEOUS | 0 refills | Status: AC
  Filled 2022-02-23 – 2022-07-12 (×2): qty 3, 28d supply, fill #0

## 2022-02-23 MED ORDER — SEMAGLUTIDE-WEIGHT MANAGEMENT 0.5 MG/0.5ML ~~LOC~~ SOAJ
0.5000 mg | SUBCUTANEOUS | 0 refills | Status: DC
  Filled 2022-02-23 – 2022-04-21 (×2): qty 2, 28d supply, fill #0

## 2022-02-23 MED ORDER — SEMAGLUTIDE-WEIGHT MANAGEMENT 0.25 MG/0.5ML ~~LOC~~ SOAJ
0.2500 mg | SUBCUTANEOUS | 0 refills | Status: DC
  Filled 2022-02-23: qty 2, 28d supply, fill #0

## 2022-02-23 MED ORDER — SEMAGLUTIDE-WEIGHT MANAGEMENT 1.7 MG/0.75ML ~~LOC~~ SOAJ
1.7000 mg | SUBCUTANEOUS | 0 refills | Status: AC
  Filled 2022-02-23: qty 3, 28d supply, fill #0

## 2022-02-23 NOTE — Patient Instructions (Addendum)
Aim to do some physical exertion for 150 minutes per week. This is typically divided into 5 days per week, 30 minutes per day. The activity should be enough to get your heart rate up. Anything is better than nothing if you have time constraints. Consider adding wt resistance exercise to help build muscle mass/metabolism. ? ?Keep the diet clean and stay active. ? ?Send me a message if you find the prescription is too expensive.  ? ?Let us know if you need anything. ?

## 2022-02-23 NOTE — Progress Notes (Signed)
Chief Complaint  ?Patient presents with  ? Weight Loss  ?  Wants to maintain weight loss  ? ? ?Subjective: ?Patient is a 61 y.o. female here for f/u wt. ? ?Pt has gained 6 lbs since last visit in 4 mo ago. Has started playing pickle ball. Some walking, dancing. Does not lift weights. 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  She is better on 4 total months of phentermine in the last year.  She knows she cannot go back on this for a while.  She is up 6 pounds in the last few months. ? ?Past Medical History:  ?Diagnosis Date  ? Anemia   ? Hamstring injury   ? Hx of gastric bypass 2004  ? Indigestion   ? Obesity   ? Post-operative nausea and vomiting   ? Varicose veins   ? ? ?Objective: ?BP 104/68   Pulse 87   Temp 98 ?F (36.7 ?C) (Oral)   Ht 5\' 5"  (1.651 m)   Wt 186 lb 6 oz (84.5 kg)   SpO2 99%   BMI 31.01 kg/m?  ?General: Awake, appears stated age ?Lungs:  No accessory muscle use ?Psych: Age appropriate judgment and insight, normal affect and mood ? ?Assessment and Plan: ?Obesity (BMI 30-39.9) - Plan: Semaglutide-Weight Management 0.25 MG/0.5ML SOAJ, Semaglutide-Weight Management 0.5 MG/0.5ML SOAJ, Semaglutide-Weight Management 1 MG/0.5ML SOAJ, Semaglutide-Weight Management 1.7 MG/0.75ML SOAJ, Semaglutide-Weight Management 2.4 MG/0.75ML SOAJ ? ?Chronic, not controlled.  Start Wegovy at 0.25 mg weekly for 4 doses.  Will increase until she gets to 2.4 mg weekly or the highest tolerated dose.  If she is able to get the medicine at an affordable rate, will recheck in 6 weeks.  If not, we will consider Saxenda.  Could also try Contrave or stand-alone bupropion, Topamax.  Counseled on diet and exercise.  I would like her to start lifting weights as a higher muscle mass could improve her metabolism. ?The patient voiced understanding and agreement to the plan. ? ? , DO ?02/23/22  ?9:33 AM ? ? ? ? ?

## 2022-02-23 NOTE — Telephone Encounter (Signed)
PA approved. Effective 02/23/22 to 09/25/22.  ?

## 2022-02-23 NOTE — Telephone Encounter (Signed)
PA initiated via Covermymeds; KEY:  [id number]. Awaiting determination.  ?

## 2022-02-24 ENCOUNTER — Other Ambulatory Visit (HOSPITAL_BASED_OUTPATIENT_CLINIC_OR_DEPARTMENT_OTHER): Payer: Self-pay

## 2022-02-24 MED ORDER — PFIZER COVID-19 VAC BIVALENT 30 MCG/0.3ML IM SUSP
INTRAMUSCULAR | 0 refills | Status: DC
  Filled 2022-02-24: qty 0.3, 1d supply, fill #0

## 2022-03-19 ENCOUNTER — Other Ambulatory Visit: Payer: Self-pay | Admitting: Family Medicine

## 2022-03-19 DIAGNOSIS — E669 Obesity, unspecified: Secondary | ICD-10-CM

## 2022-03-21 ENCOUNTER — Other Ambulatory Visit (HOSPITAL_BASED_OUTPATIENT_CLINIC_OR_DEPARTMENT_OTHER): Payer: Self-pay

## 2022-03-21 MED ORDER — WEGOVY 0.25 MG/0.5ML ~~LOC~~ SOAJ
0.2500 mg | SUBCUTANEOUS | 0 refills | Status: AC
  Filled 2022-03-21: qty 2, 28d supply, fill #0

## 2022-04-14 ENCOUNTER — Other Ambulatory Visit (HOSPITAL_BASED_OUTPATIENT_CLINIC_OR_DEPARTMENT_OTHER): Payer: Self-pay

## 2022-04-14 MED ORDER — DIAZEPAM 5 MG PO TABS
5.0000 mg | ORAL_TABLET | ORAL | 0 refills | Status: DC
  Filled 2022-04-14: qty 2, 1d supply, fill #0

## 2022-04-14 MED ORDER — NEOMYCIN-POLYMYXIN-DEXAMETH 3.5-10000-0.1 OP OINT
TOPICAL_OINTMENT | OPHTHALMIC | 0 refills | Status: DC
  Filled 2022-04-14: qty 3.5, 3d supply, fill #0

## 2022-04-14 MED ORDER — LORAZEPAM 1 MG PO TABS
1.0000 mg | ORAL_TABLET | ORAL | 0 refills | Status: DC
  Filled 2022-04-14: qty 2, 1d supply, fill #0

## 2022-04-21 ENCOUNTER — Encounter: Payer: Self-pay | Admitting: Family Medicine

## 2022-04-22 ENCOUNTER — Other Ambulatory Visit (HOSPITAL_BASED_OUTPATIENT_CLINIC_OR_DEPARTMENT_OTHER): Payer: Self-pay

## 2022-04-29 ENCOUNTER — Other Ambulatory Visit (HOSPITAL_BASED_OUTPATIENT_CLINIC_OR_DEPARTMENT_OTHER): Payer: Self-pay

## 2022-04-29 MED ORDER — MOXIFLOXACIN HCL 0.5 % OP SOLN
OPHTHALMIC | 0 refills | Status: DC
  Filled 2022-04-29: qty 3, 10d supply, fill #0

## 2022-05-18 ENCOUNTER — Other Ambulatory Visit: Payer: Self-pay | Admitting: Family Medicine

## 2022-05-18 ENCOUNTER — Telehealth: Payer: Self-pay

## 2022-05-18 ENCOUNTER — Other Ambulatory Visit (HOSPITAL_BASED_OUTPATIENT_CLINIC_OR_DEPARTMENT_OTHER): Payer: Self-pay

## 2022-05-18 DIAGNOSIS — M5412 Radiculopathy, cervical region: Secondary | ICD-10-CM | POA: Insufficient documentation

## 2022-05-18 DIAGNOSIS — E669 Obesity, unspecified: Secondary | ICD-10-CM

## 2022-05-18 DIAGNOSIS — M5030 Other cervical disc degeneration, unspecified cervical region: Secondary | ICD-10-CM | POA: Insufficient documentation

## 2022-05-18 MED ORDER — WEGOVY 0.5 MG/0.5ML ~~LOC~~ SOAJ
0.5000 mg | SUBCUTANEOUS | 0 refills | Status: AC
  Filled 2022-05-18: qty 2, 28d supply, fill #0

## 2022-05-18 NOTE — Telephone Encounter (Signed)
KEY :[id number]    Awaiting determination from cover my meds

## 2022-05-19 NOTE — Telephone Encounter (Signed)
PA approved.

## 2022-05-19 NOTE — Telephone Encounter (Signed)
Approved from 05/18/2022 to 05/18/2025.

## 2022-06-13 ENCOUNTER — Other Ambulatory Visit (HOSPITAL_BASED_OUTPATIENT_CLINIC_OR_DEPARTMENT_OTHER): Payer: Self-pay

## 2022-06-21 ENCOUNTER — Other Ambulatory Visit (HOSPITAL_BASED_OUTPATIENT_CLINIC_OR_DEPARTMENT_OTHER): Payer: Self-pay

## 2022-06-28 ENCOUNTER — Other Ambulatory Visit (HOSPITAL_COMMUNITY): Payer: Self-pay

## 2022-06-28 ENCOUNTER — Other Ambulatory Visit (HOSPITAL_BASED_OUTPATIENT_CLINIC_OR_DEPARTMENT_OTHER): Payer: Self-pay

## 2022-06-29 ENCOUNTER — Encounter (INDEPENDENT_AMBULATORY_CARE_PROVIDER_SITE_OTHER): Payer: Self-pay

## 2022-07-13 ENCOUNTER — Other Ambulatory Visit (HOSPITAL_BASED_OUTPATIENT_CLINIC_OR_DEPARTMENT_OTHER): Payer: Self-pay

## 2022-07-14 ENCOUNTER — Other Ambulatory Visit (HOSPITAL_BASED_OUTPATIENT_CLINIC_OR_DEPARTMENT_OTHER): Payer: Self-pay

## 2022-08-15 ENCOUNTER — Other Ambulatory Visit (HOSPITAL_BASED_OUTPATIENT_CLINIC_OR_DEPARTMENT_OTHER): Payer: Self-pay

## 2022-08-15 MED ORDER — FLUCONAZOLE 150 MG PO TABS
ORAL_TABLET | ORAL | 0 refills | Status: DC
  Filled 2022-08-15: qty 3, 9d supply, fill #0

## 2022-08-17 ENCOUNTER — Encounter: Payer: Self-pay | Admitting: Family Medicine

## 2022-08-17 ENCOUNTER — Other Ambulatory Visit (HOSPITAL_BASED_OUTPATIENT_CLINIC_OR_DEPARTMENT_OTHER): Payer: Self-pay

## 2022-08-18 ENCOUNTER — Other Ambulatory Visit: Payer: Self-pay | Admitting: Family Medicine

## 2022-08-18 ENCOUNTER — Other Ambulatory Visit (HOSPITAL_COMMUNITY): Payer: Self-pay

## 2022-08-18 MED ORDER — WEGOVY 2.4 MG/0.75ML ~~LOC~~ SOAJ
2.4000 mg | SUBCUTANEOUS | 5 refills | Status: DC
  Filled 2022-08-18 – 2022-08-23 (×2): qty 3, 28d supply, fill #0
  Filled 2022-09-15: qty 3, 28d supply, fill #1
  Filled 2022-11-08: qty 3, 28d supply, fill #2
  Filled 2023-01-09: qty 3, 28d supply, fill #3
  Filled 2023-02-01 – 2023-02-03 (×2): qty 3, 28d supply, fill #4
  Filled 2023-03-13: qty 3, 28d supply, fill #5

## 2022-08-19 ENCOUNTER — Other Ambulatory Visit (HOSPITAL_BASED_OUTPATIENT_CLINIC_OR_DEPARTMENT_OTHER): Payer: Self-pay

## 2022-08-23 ENCOUNTER — Other Ambulatory Visit (HOSPITAL_BASED_OUTPATIENT_CLINIC_OR_DEPARTMENT_OTHER): Payer: Self-pay

## 2022-08-23 ENCOUNTER — Other Ambulatory Visit (HOSPITAL_COMMUNITY): Payer: Self-pay

## 2022-08-29 ENCOUNTER — Other Ambulatory Visit (HOSPITAL_BASED_OUTPATIENT_CLINIC_OR_DEPARTMENT_OTHER): Payer: Self-pay

## 2022-08-29 MED ORDER — TRAMADOL HCL 50 MG PO TABS
50.0000 mg | ORAL_TABLET | Freq: Four times a day (QID) | ORAL | 0 refills | Status: DC | PRN
  Filled 2022-08-29: qty 6, 1d supply, fill #0

## 2022-09-09 ENCOUNTER — Other Ambulatory Visit (HOSPITAL_BASED_OUTPATIENT_CLINIC_OR_DEPARTMENT_OTHER): Payer: Self-pay

## 2022-09-09 MED ORDER — AMOXICILLIN 500 MG PO CAPS
500.0000 mg | ORAL_CAPSULE | Freq: Three times a day (TID) | ORAL | 0 refills | Status: DC
  Filled 2022-09-09: qty 21, 7d supply, fill #0

## 2022-09-12 ENCOUNTER — Other Ambulatory Visit (HOSPITAL_COMMUNITY): Payer: Self-pay

## 2022-09-12 ENCOUNTER — Encounter (INDEPENDENT_AMBULATORY_CARE_PROVIDER_SITE_OTHER): Payer: BC Managed Care – PPO | Admitting: Family Medicine

## 2022-09-12 ENCOUNTER — Other Ambulatory Visit (HOSPITAL_BASED_OUTPATIENT_CLINIC_OR_DEPARTMENT_OTHER): Payer: Self-pay

## 2022-09-12 DIAGNOSIS — B3731 Acute candidiasis of vulva and vagina: Secondary | ICD-10-CM

## 2022-09-12 MED ORDER — FLUCONAZOLE 150 MG PO TABS
150.0000 mg | ORAL_TABLET | ORAL | 0 refills | Status: DC
  Filled 2022-09-12 – 2022-09-13 (×2): qty 3, 9d supply, fill #0

## 2022-09-12 NOTE — Telephone Encounter (Signed)

## 2022-09-13 ENCOUNTER — Other Ambulatory Visit (HOSPITAL_COMMUNITY): Payer: Self-pay

## 2022-09-13 ENCOUNTER — Other Ambulatory Visit (HOSPITAL_BASED_OUTPATIENT_CLINIC_OR_DEPARTMENT_OTHER): Payer: Self-pay

## 2022-09-15 ENCOUNTER — Other Ambulatory Visit (HOSPITAL_BASED_OUTPATIENT_CLINIC_OR_DEPARTMENT_OTHER): Payer: Self-pay

## 2022-09-19 ENCOUNTER — Other Ambulatory Visit (HOSPITAL_BASED_OUTPATIENT_CLINIC_OR_DEPARTMENT_OTHER): Payer: Self-pay

## 2022-09-28 ENCOUNTER — Ambulatory Visit (INDEPENDENT_AMBULATORY_CARE_PROVIDER_SITE_OTHER): Payer: BC Managed Care – PPO | Admitting: Family Medicine

## 2022-09-28 ENCOUNTER — Encounter: Payer: Self-pay | Admitting: Family Medicine

## 2022-09-28 ENCOUNTER — Other Ambulatory Visit (HOSPITAL_COMMUNITY)
Admission: RE | Admit: 2022-09-28 | Discharge: 2022-09-28 | Disposition: A | Discharge status: Home / Self Care | Payer: BC Managed Care – PPO | Source: Ambulatory Visit | Attending: Family Medicine | Admitting: Family Medicine

## 2022-09-28 VITALS — BP 122/80 | HR 81 | Temp 98.3°F | Ht 66.0 in | Wt 185.4 lb

## 2022-09-28 DIAGNOSIS — R3915 Urgency of urination: Secondary | ICD-10-CM

## 2022-09-28 NOTE — Progress Notes (Signed)
Chief Complaint  Patient presents with   Vaginitis    Since being on an antibiotic Infection     Kristie Martinez is a 61 y.o. female here for possible UTI.  Duration: 3 weeks. Symptoms: D/c, urinary frequency, urinary retention, itching, and urgency Denies: hematuria, fever, nausea, vomiting, and flank pain, vaginal discharge Hx of recurrent UTI? No Denies new sexual partners.  Past Medical History:  Diagnosis Date   Anemia    Hamstring injury    Hx of gastric bypass 2004   Indigestion    Obesity    Post-operative nausea and vomiting    Varicose veins      BP 122/80 (BP Location: Left Arm, Patient Position: Sitting, Cuff Size: Normal)   Pulse 81   Temp 98.3 F (36.8 C) (Oral)   Ht 5\' 6"  (1.676 m)   Wt 185 lb 6 oz (84.1 kg)   SpO2 94%   BMI 29.92 kg/m  General: Awake, alert, appears stated age Heart: RRR Lungs: CTAB, normal respiratory effort, no accessory muscle usage Abd: BS+, soft, NT, ND, no masses or organomegaly MSK: No CVA tenderness, neg Lloyd's sign Psych: Age appropriate judgment and insight  Urinary urgency - Plan: Cervicovaginal ancillary only( [place]), Urinalysis, Urine Culture  Sounds like it could be a UTI which she does not have often.  Itching makes me think yeast but she has been on several treatments for that.  We will check ancillary in addition to urinalysis/culture.  We will treat accordingly.  Stay hydrated. Seek immediate care if pt starts to develop fevers, new/worsening symptoms, uncontrollable N/V. F/u prn. The patient voiced understanding and agreement to the plan.  [place], DO 09/28/22 4:27 PM

## 2022-09-28 NOTE — Patient Instructions (Signed)
Stay hydrated.   Warning signs/symptoms: Uncontrollable nausea/vomiting, fevers, worsening symptoms despite treatment, confusion.  Give us around 2 business days to get culture back to you.  Let us know if you need anything. 

## 2022-09-29 LAB — URINE CULTURE
MICRO NUMBER:: 14161327
SPECIMEN QUALITY:: ADEQUATE

## 2022-09-29 LAB — URINALYSIS
Bilirubin Urine: NEGATIVE
Hgb urine dipstick: NEGATIVE
Ketones, ur: NEGATIVE
Leukocytes,Ua: NEGATIVE
Nitrite: NEGATIVE
Specific Gravity, Urine: <=1.005 — AB (ref 1.000–1.030)
Total Protein, Urine: NEGATIVE
Urine Glucose: NEGATIVE
Urobilinogen, UA: 0.2 (ref 0.0–1.0)
pH: 6 (ref 5.0–8.0)

## 2022-09-30 LAB — CERVICOVAGINAL ANCILLARY ONLY
Bacterial Vaginitis (gardnerella): NEGATIVE
Candida Glabrata: NEGATIVE
Candida Vaginitis: NEGATIVE
Chlamydia: NEGATIVE
Comment: NEGATIVE
Comment: NEGATIVE
Comment: NEGATIVE
Comment: NEGATIVE
Comment: NEGATIVE
Comment: NORMAL
Neisseria Gonorrhea: NEGATIVE
Trichomonas: NEGATIVE

## 2022-10-01 ENCOUNTER — Encounter: Payer: Self-pay | Admitting: Family Medicine

## 2022-10-03 ENCOUNTER — Other Ambulatory Visit (HOSPITAL_COMMUNITY): Payer: Self-pay

## 2022-10-03 ENCOUNTER — Other Ambulatory Visit: Payer: Self-pay | Admitting: Family Medicine

## 2022-10-03 MED ORDER — OXYBUTYNIN CHLORIDE ER 5 MG PO TB24
5.0000 mg | ORAL_TABLET | Freq: Every day | ORAL | 1 refills | Status: DC
  Filled 2022-10-03 – 2022-10-04 (×2): qty 30, 30d supply, fill #0
  Filled 2022-11-08: qty 30, 30d supply, fill #1

## 2022-10-04 ENCOUNTER — Other Ambulatory Visit (HOSPITAL_BASED_OUTPATIENT_CLINIC_OR_DEPARTMENT_OTHER): Payer: Self-pay

## 2022-10-05 ENCOUNTER — Other Ambulatory Visit (HOSPITAL_COMMUNITY): Payer: Self-pay

## 2022-11-08 ENCOUNTER — Telehealth: Payer: Self-pay

## 2022-11-08 ENCOUNTER — Other Ambulatory Visit (HOSPITAL_BASED_OUTPATIENT_CLINIC_OR_DEPARTMENT_OTHER): Payer: Self-pay

## 2022-11-08 ENCOUNTER — Other Ambulatory Visit: Payer: Self-pay

## 2022-11-08 NOTE — Telephone Encounter (Signed)
Received approval from 11/08/2022---06/09/2023. Patient made aware.

## 2022-11-08 NOTE — Telephone Encounter (Signed)
PA initiated via Covermymeds; KEY: [id number]. Awaiting determination.

## 2022-11-09 ENCOUNTER — Other Ambulatory Visit (HOSPITAL_BASED_OUTPATIENT_CLINIC_OR_DEPARTMENT_OTHER): Payer: Self-pay

## 2023-01-02 ENCOUNTER — Other Ambulatory Visit (HOSPITAL_BASED_OUTPATIENT_CLINIC_OR_DEPARTMENT_OTHER): Payer: Self-pay

## 2023-01-09 ENCOUNTER — Other Ambulatory Visit (HOSPITAL_BASED_OUTPATIENT_CLINIC_OR_DEPARTMENT_OTHER): Payer: Self-pay

## 2023-01-11 ENCOUNTER — Other Ambulatory Visit (HOSPITAL_BASED_OUTPATIENT_CLINIC_OR_DEPARTMENT_OTHER): Payer: Self-pay

## 2023-02-02 ENCOUNTER — Other Ambulatory Visit (HOSPITAL_BASED_OUTPATIENT_CLINIC_OR_DEPARTMENT_OTHER): Payer: Self-pay

## 2023-02-10 ENCOUNTER — Other Ambulatory Visit (HOSPITAL_COMMUNITY): Payer: Self-pay

## 2023-02-10 MED ORDER — TOBRAMYCIN-DEXAMETHASONE 0.3-0.1 % OP SUSP
1.0000 [drp] | Freq: Three times a day (TID) | OPHTHALMIC | 1 refills | Status: DC
  Filled 2023-02-10: qty 5, 10d supply, fill #0
  Filled 2023-03-13: qty 5, 10d supply, fill #1

## 2023-03-13 ENCOUNTER — Other Ambulatory Visit (HOSPITAL_BASED_OUTPATIENT_CLINIC_OR_DEPARTMENT_OTHER): Payer: Self-pay

## 2023-03-14 ENCOUNTER — Other Ambulatory Visit (HOSPITAL_BASED_OUTPATIENT_CLINIC_OR_DEPARTMENT_OTHER): Payer: Self-pay

## 2023-03-23 ENCOUNTER — Other Ambulatory Visit (HOSPITAL_BASED_OUTPATIENT_CLINIC_OR_DEPARTMENT_OTHER): Payer: Self-pay

## 2023-04-19 ENCOUNTER — Other Ambulatory Visit (HOSPITAL_BASED_OUTPATIENT_CLINIC_OR_DEPARTMENT_OTHER): Payer: Self-pay

## 2023-04-21 ENCOUNTER — Encounter: Payer: Self-pay | Admitting: Family Medicine

## 2023-04-21 ENCOUNTER — Ambulatory Visit (INDEPENDENT_AMBULATORY_CARE_PROVIDER_SITE_OTHER)
Admission: RE | Admit: 2023-04-21 | Discharge: 2023-04-21 | Disposition: A | Discharge status: Home / Self Care | Payer: BC Managed Care – PPO | Source: Ambulatory Visit | Attending: Family Medicine | Admitting: Family Medicine

## 2023-04-21 ENCOUNTER — Other Ambulatory Visit (HOSPITAL_BASED_OUTPATIENT_CLINIC_OR_DEPARTMENT_OTHER): Payer: Self-pay | Admitting: Family Medicine

## 2023-04-21 ENCOUNTER — Other Ambulatory Visit (HOSPITAL_BASED_OUTPATIENT_CLINIC_OR_DEPARTMENT_OTHER): Payer: Self-pay

## 2023-04-21 ENCOUNTER — Ambulatory Visit: Payer: BC Managed Care – PPO | Admitting: Family Medicine

## 2023-04-21 VITALS — BP 120/76 | HR 77 | Temp 97.7°F | Ht 66.0 in | Wt 191.2 lb

## 2023-04-21 DIAGNOSIS — M25561 Pain in right knee: Secondary | ICD-10-CM

## 2023-04-21 DIAGNOSIS — G4700 Insomnia, unspecified: Secondary | ICD-10-CM | POA: Diagnosis not present

## 2023-04-21 DIAGNOSIS — Z6832 Body mass index (BMI) 32.0-32.9, adult: Secondary | ICD-10-CM

## 2023-04-21 DIAGNOSIS — E6609 Other obesity due to excess calories: Secondary | ICD-10-CM

## 2023-04-21 DIAGNOSIS — Z1231 Encounter for screening mammogram for malignant neoplasm of breast: Secondary | ICD-10-CM

## 2023-04-21 MED ORDER — MELOXICAM 15 MG PO TABS
15.0000 mg | ORAL_TABLET | Freq: Every day | ORAL | 0 refills | Status: DC
  Filled 2023-04-21: qty 30, 30d supply, fill #0

## 2023-04-21 MED ORDER — TRAZODONE HCL 50 MG PO TABS
25.0000 mg | ORAL_TABLET | Freq: Every evening | ORAL | 2 refills | Status: DC | PRN
  Filled 2023-04-21: qty 90, 90d supply, fill #0
  Filled 2023-07-23: qty 90, 90d supply, fill #1
  Filled 2023-11-28: qty 90, 90d supply, fill #2

## 2023-04-21 NOTE — Patient Instructions (Addendum)
Heat (pad or rice pillow in microwave) over affected area, 10-15 minutes twice daily.   Ice/cold pack over area for 10-15 min twice daily.  OK to take Tylenol 1000 mg (2 extra strength tabs) or 975 mg (3 regular strength tabs) every 6 hours as needed.  Please get your X-ray done in the basement of our [place] office located on: [address] [place], [place] [address]  You do not need an appointment for that location.   Let us know if you need anything.  Knee Exercises It is normal to feel mild stretching, pulling, tightness, or discomfort as you do these exercises, but you should stop right away if you feel sudden pain or your pain gets worse.  STRETCHING AND RANGE OF MOTION EXERCISES  These exercises warm up your muscles and joints and improve the movement and flexibility of your knee. These exercises also help to relieve pain, numbness, and tingling. Exercise A: Knee Extension, Prone  Lie on your abdomen on a bed. Place your left / right knee just beyond the edge of the surface so your knee is not on the bed. You can put a towel under your left / right thigh just above your knee for comfort. Relax your leg muscles and allow gravity to straighten your knee. You should feel a stretch behind your left / right knee. Hold this position for 30 seconds. Scoot up so your knee is supported between repetitions. Repeat 2 times. Complete this stretch 3 times per week. Exercise B: Knee Flexion, Active     Lie on your back with both knees straight. If this causes back discomfort, bend your left / right knee so your foot is flat on the floor. Slowly slide your left / right heel back toward your buttocks until you feel a gentle stretch in the front of your knee or thigh. Hold this position for 30 seconds. Slowly slide your left / right heel back to the starting position. Repeat 2 times. Complete this exercise 3 times per week. Exercise C: Quadriceps, Prone     Lie on your abdomen on a firm surface,  such as a bed or padded floor. Bend your left / right knee and hold your ankle. If you cannot reach your ankle or pant leg, loop a belt around your foot and grab the belt instead. Gently pull your heel toward your buttocks. Your knee should not slide out to the side. You should feel a stretch in the front of your thigh and knee. Hold this position for 30 seconds. Repeat 2 times. Complete this stretch 3 times per week. Exercise D: Hamstring, Supine  Lie on your back. Loop a belt or towel over the ball of your left / right foot. The ball of your foot is on the walking surface, right under your toes. Straighten your left / right knee and slowly pull on the belt to raise your leg until you feel a gentle stretch behind your knee. Do not let your left / right knee bend while you do this. Keep your other leg flat on the floor. Hold this position for 30 seconds. Repeat 2 times. Complete this stretch 3 times per week. STRENGTHENING EXERCISES  These exercises build strength and endurance in your knee. Endurance is the ability to use your muscles for a long time, even after they get tired. Exercise E: Quadriceps, Isometric     Lie on your back with your left / right leg extended and your other knee bent. Put a rolled  towel or small pillow under your knee if told by your health care provider. Slowly tense the muscles in the front of your left / right thigh. You should see your kneecap slide up toward your hip or see increased dimpling just above the knee. This motion will push the back of the knee toward the floor. For 3 seconds, keep the muscle as tight as you can without increasing your pain. Relax the muscles slowly and completely. Repeat for 10 total reps Repeat 2 ti mes. Complete this exercise 3 times per week. Exercise F: Straight Leg Raises - Quadriceps  Lie on your back with your left / right leg extended and your other knee bent. Tense the muscles in the front of your left / right thigh. You  should see your kneecap slide up or see increased dimpling just above the knee. Your thigh may even shake a bit. Keep these muscles tight as you raise your leg 4-6 inches (10-15 cm) off the floor. Do not let your knee bend. Hold this position for 3 seconds. Keep these muscles tense as you lower your leg. Relax your muscles slowly and completely after each repetition. 10 total reps. Repeat 2 times. Complete this exercise 3 times per week.  Exercise G: Hamstring Curls     If told by your health care provider, do this exercise while wearing ankle weights. Begin with 5 lb weights (optional). Then increase the weight by 1 lb (0.5 kg) increments. Do not wear ankle weights that are more than 20 lbs to start with. Lie on your abdomen with your legs straight. Bend your left / right knee as far as you can without feeling pain. Keep your hips flat against the floor. Hold this position for 3 seconds. Slowly lower your leg to the starting position. Repeat for 10 reps.  Repeat 2 times. Complete this exercise 3 times per week. Exercise H: Squats (Quadriceps)  Stand in front of a table, with your feet and knees pointing straight ahead. You may rest your hands on the table for balance but not for support. Slowly bend your knees and lower your hips like you are going to sit in a chair. Keep your weight over your heels, not over your toes. Keep your lower legs upright so they are parallel with the table legs. Do not let your hips go lower than your knees. Do not bend lower than told by your health care provider. If your knee pain increases, do not bend as low. Hold the squat position for 1 second. Slowly push with your legs to return to standing. Do not use your hands to pull yourself to standing. Repeat 2 times. Complete this exercise 3 times per week. Exercise I: Wall Slides (Quadriceps)     Lean your back against a smooth wall or door while you walk your feet out 18-24 inches (46-61 cm) from it. Place  your feet hip-width apart. Slowly slide down the wall or door until your knees Repeat 2 times. Complete this exercise every other day. Exercise K: Straight Leg Raises - Hip Abductors  Lie on your side with your left / right leg in the top position. Lie so your head, shoulder, knee, and hip line up. You may bend your bottom knee to help you keep your balance. Roll your hips slightly forward so your hips are stacked directly over each other and your left / right knee is facing forward. Leading with your heel, lift your top leg 4-6 inches (10-15 cm). You should  feel the muscles in your outer hip lifting. Do not let your foot drift forward. Do not let your knee roll toward the ceiling. Hold this position for 3 seconds. Slowly return your leg to the starting position. Let your muscles relax completely after each repetition. 10 total reps. Repeat 2 times. Complete this exercise 3 times per week. Exercise J: Straight Leg Raises - Hip Extensors  Lie on your abdomen on a firm surface. You can put a pillow under your hips if that is more comfortable. Tense the muscles in your buttocks and lift your left / right leg about 4-6 inches (10-15 cm). Keep your knee straight as you lift your leg. Hold this position for 3 seconds. Slowly lower your leg to the starting position. Let your leg relax completely after each repetition. Repeat 2 times. Complete this exercise 3 times per week. Document Released: 09/21/2005 Document Revised: 08/01/2016 Document Reviewed: 09/13/2015 Elsevier Interactive Patient Education  2017 [employer].

## 2023-04-21 NOTE — Progress Notes (Signed)
Musculoskeletal Exam  Patient: Kristie Martinez DOB: 1961-01-12  DOS: 04/21/2023  SUBJECTIVE:  Chief Complaint:   Chief Complaint  Patient presents with   Knee Pain    Right     Kristie Martinez is a 62 y.o.  female for evaluation and treatment of R knee pain.   Onset:  2 months ago. Playing more pickle ball, cycling; nothing obvious Location: front of knee Character:  sharp  Progression of issue:  has worsened Associated symptoms: No decreased ROM, catching/locking, redness, bruising, swelling Treatment: to date has been ice and kinesio-tape, Aleve, Tylenol, Voltaren gel.   Neurovascular symptoms: no +famhx of OA in mom who starting getting s/s's in her 24's.   Obesity Pt has gained some weight after going on a cruise and going camping. She was on Wegovy which was working well. Ins stopped covering. She has been walking and playing pickle ball for exercise. Some cycling as well. Historically has done well with phentermine as well.   Past Medical History:  Diagnosis Date   Anemia    Hamstring injury    Hx of gastric bypass 2004   Indigestion    Obesity    Post-operative nausea and vomiting    Varicose veins     Objective: VITAL SIGNS: BP 120/76 (BP Location: Left Arm, Patient Position: Sitting, Cuff Size: Normal)   Pulse 77   Temp 97.7 F (36.5 C) (Oral)   Ht 5\' 6"  (1.676 m)   Wt 191 lb 4 oz (86.8 kg)   SpO2 99%   BMI 30.87 kg/m  Constitutional: Well formed, well developed. No acute distress. Thorax & Lungs: No accessory muscle use Musculoskeletal: R knee.   Normal active range of motion: yes.   Normal passive range of motion: yes Tenderness to palpation: mild ttp over medial jt line Deformity: no Ecchymosis: no Tests positive: none Tests negative: Stine's, Lachman's, varus/valgus stress, patellar app/grind Neurologic: Normal sensory function. No focal deficits noted. DTR's equal and symmetric in LE's. No clonus. Psychiatric: Normal mood. Age appropriate  judgment and insight. Alert & oriented x 3.    Assessment:  Right knee pain, unspecified chronicity - Plan: meloxicam (MOBIC) 15 MG tablet, DG Knee Complete 4 Views Right  Insomnia, unspecified type - Plan: traZODone (DESYREL) 50 MG tablet  Class 1 obesity due to excess calories without serious comorbidity with body mass index (BMI) of 32.0 to 32.9 in adult  Plan: PFS vs OA. Ck XR at her convenience. Stretches/exercises, heat, ice, Tylenol.  Refill Discussed restarting phentermine. Would like for her to be off of NSAIDs prior to starting to avoid heart strain.  F/u as originally scheduled. The patient voiced understanding and agreement to the plan.  I spent 30 min w the pt discussing the above plans in addition to reviewing her chart on the same day of the visit.   Jilda Roche [place], DO 04/21/23  10:46 AM

## 2023-04-25 ENCOUNTER — Inpatient Hospital Stay (HOSPITAL_BASED_OUTPATIENT_CLINIC_OR_DEPARTMENT_OTHER): Admission: RE | Admit: 2023-04-25 | Payer: BC Managed Care – PPO | Source: Ambulatory Visit

## 2023-04-28 ENCOUNTER — Encounter: Payer: Self-pay | Admitting: Family Medicine

## 2023-04-28 ENCOUNTER — Other Ambulatory Visit: Payer: Self-pay | Admitting: Family Medicine

## 2023-04-28 ENCOUNTER — Other Ambulatory Visit (HOSPITAL_COMMUNITY): Payer: Self-pay

## 2023-04-28 ENCOUNTER — Other Ambulatory Visit (HOSPITAL_BASED_OUTPATIENT_CLINIC_OR_DEPARTMENT_OTHER): Payer: Self-pay

## 2023-04-28 DIAGNOSIS — M25561 Pain in right knee: Secondary | ICD-10-CM

## 2023-04-28 MED ORDER — PREDNISONE 20 MG PO TABS
40.0000 mg | ORAL_TABLET | Freq: Every day | ORAL | 0 refills | Status: AC
  Filled 2023-04-28 (×2): qty 10, 5d supply, fill #0

## 2023-05-01 ENCOUNTER — Encounter (HOSPITAL_BASED_OUTPATIENT_CLINIC_OR_DEPARTMENT_OTHER): Payer: Self-pay

## 2023-05-01 ENCOUNTER — Ambulatory Visit (HOSPITAL_BASED_OUTPATIENT_CLINIC_OR_DEPARTMENT_OTHER)
Admission: RE | Admit: 2023-05-01 | Discharge: 2023-05-01 | Disposition: A | Discharge status: Home / Self Care | Payer: BC Managed Care – PPO | Source: Ambulatory Visit | Attending: Family Medicine | Admitting: Family Medicine

## 2023-05-01 DIAGNOSIS — Z1231 Encounter for screening mammogram for malignant neoplasm of breast: Secondary | ICD-10-CM | POA: Insufficient documentation

## 2023-05-11 ENCOUNTER — Ambulatory Visit: Payer: BC Managed Care – PPO | Admitting: Family Medicine

## 2023-05-11 ENCOUNTER — Encounter: Payer: Self-pay | Admitting: Family Medicine

## 2023-05-11 VITALS — BP 110/64 | Ht 66.0 in | Wt 191.0 lb

## 2023-05-11 DIAGNOSIS — M25561 Pain in right knee: Secondary | ICD-10-CM | POA: Diagnosis not present

## 2023-05-11 NOTE — Patient Instructions (Signed)
You either flared mild arthritis in this knee or have a degenerative medial meniscus tear.  Both are treated similarly. Icing 15 minutes at a time if needed. Aleve 1-2 tabs twice a day with food if needed. Hip side raises, knee extensions, hamstring curls, calf raises - 3 sets of 10 most days of the week. Activities as tolerated. Call me if you have any issues otherwise follow up as needed.

## 2023-05-12 NOTE — Progress Notes (Signed)
PCP: Sharlene Dory, DO  Subjective:   HPI: Patient is a 62 y.o. female here for right knee pain.  Patient reports she has had some right knee pain for months but it really flared up over the past couple weeks. Was bothering more with stairs. Pain medial. She has been doing home exercise program and is very active. She started taking prednisone prescribed from her PCP and pain is much improved. No acute injury or trauma.  Past Medical History:  Diagnosis Date   Anemia    Hamstring injury    Hx of gastric bypass 2004   Indigestion    Obesity    Post-operative nausea and vomiting    Varicose veins     Current Outpatient Medications on File Prior to Visit  Medication Sig Dispense Refill   aspirin EC 81 MG EC tablet Take 1 tablet (81 mg total) by mouth daily. Swallow whole. 30 tablet 11   calcium carbonate (TUMS - DOSED IN MG ELEMENTAL CALCIUM) 500 MG chewable tablet Chew 1 tablet by mouth daily.     Cyanocobalamin (VITAMIN B-12 PO) Take 5,000 Units by mouth daily.      Magnesium 400 MG TABS Take 800 mg by mouth in the morning and at bedtime.      methylcellulose oral powder Take 1 packet by mouth in the morning and at bedtime.      traZODone (DESYREL) 50 MG tablet Take 1/2-1 tablet (25-50 mg total) by mouth at bedtime as needed for sleep. 90 tablet 2   vitamin k 100 MCG tablet Take 50 mcg by mouth daily.     No current facility-administered medications on file prior to visit.    Past Surgical History:  Procedure Laterality Date   CERVICAL DISC ARTHROPLASTY  2019   ENDOMETRIAL ABLATION  2004   ENDOVENOUS ABLATION SAPHENOUS VEIN W/ LASER  09-27-2012   left greater saphenous vein  by Gretta Began MD   ENDOVENOUS ABLATION SAPHENOUS VEIN W/ LASER  11-08-2012   right greater saphenous vein   by Gretta Began MD   mini gastric bypass   2004   OTHER SURGICAL HISTORY  2004   mini gastric bypass   TONSILLECTOMY     TONSILLECTOMY AND ADENOIDECTOMY  1970    No Known  Allergies  BP 110/64 (BP Location: Left Arm, Patient Position: Sitting)   Ht 5\' 6"  (1.676 m)   Wt 191 lb (86.6 kg)   BMI 30.83 kg/m       No data to display              No data to display              Objective:  Physical Exam:  Gen: NAD, comfortable in exam room  Right knee: No gross deformity, ecchymoses, swelling. Minimal TTP medial joint line. FROM with normal strength. Negative ant/post drawers. Negative valgus/varus testing. Negative lachman.  Negative mcmurrays, apleys. Mild pain with thessalys. NV intact distally.   Assessment & Plan:  1. Right knee pain - independently reviewed radiographs including standing views and no abnormalities - discussed possible she has a focal area of degenerative change vs degenerative medial meniscus tear.  She's doing much better though and both are treated similarly.  Icing, strengthening exercises reviewed.  Activities as tolerated.  F/u prn.

## 2023-05-23 ENCOUNTER — Other Ambulatory Visit (HOSPITAL_COMMUNITY): Payer: Self-pay

## 2023-05-23 ENCOUNTER — Other Ambulatory Visit: Payer: Self-pay | Admitting: Family Medicine

## 2023-05-23 ENCOUNTER — Encounter: Payer: Self-pay | Admitting: Family Medicine

## 2023-05-23 MED ORDER — PHENTERMINE HCL 37.5 MG PO CAPS
37.5000 mg | ORAL_CAPSULE | ORAL | 0 refills | Status: DC
  Filled 2023-05-23 – 2023-05-24 (×2): qty 30, 30d supply, fill #0

## 2023-05-24 ENCOUNTER — Other Ambulatory Visit (HOSPITAL_BASED_OUTPATIENT_CLINIC_OR_DEPARTMENT_OTHER): Payer: Self-pay

## 2023-05-24 ENCOUNTER — Other Ambulatory Visit (HOSPITAL_COMMUNITY): Payer: Self-pay

## 2023-07-07 ENCOUNTER — Other Ambulatory Visit (HOSPITAL_BASED_OUTPATIENT_CLINIC_OR_DEPARTMENT_OTHER): Payer: Self-pay

## 2023-08-15 ENCOUNTER — Other Ambulatory Visit (HOSPITAL_COMMUNITY): Payer: Self-pay

## 2023-08-15 MED ORDER — INFLUENZA VIRUS VACC SPLIT PF (FLUZONE) 0.5 ML IM SUSY
0.5000 mL | PREFILLED_SYRINGE | Freq: Once | INTRAMUSCULAR | 0 refills | Status: AC
  Filled 2023-08-15: qty 0.5, 1d supply, fill #0

## 2023-09-19 ENCOUNTER — Encounter: Payer: Self-pay | Admitting: Family Medicine

## 2023-10-03 ENCOUNTER — Other Ambulatory Visit (HOSPITAL_BASED_OUTPATIENT_CLINIC_OR_DEPARTMENT_OTHER): Payer: Self-pay

## 2023-10-03 ENCOUNTER — Ambulatory Visit: Payer: BC Managed Care – PPO | Admitting: Family Medicine

## 2023-10-03 ENCOUNTER — Encounter: Payer: Self-pay | Admitting: Family Medicine

## 2023-10-03 VITALS — BP 124/72 | HR 72 | Temp 98.0°F | Resp 16 | Ht 66.0 in | Wt 200.0 lb

## 2023-10-03 DIAGNOSIS — E669 Obesity, unspecified: Secondary | ICD-10-CM | POA: Diagnosis not present

## 2023-10-03 DIAGNOSIS — M67959 Unspecified disorder of synovium and tendon, unspecified thigh: Secondary | ICD-10-CM | POA: Diagnosis not present

## 2023-10-03 DIAGNOSIS — Z6832 Body mass index (BMI) 32.0-32.9, adult: Secondary | ICD-10-CM | POA: Diagnosis not present

## 2023-10-03 MED ORDER — MELOXICAM 15 MG PO TABS
15.0000 mg | ORAL_TABLET | Freq: Every day | ORAL | 3 refills | Status: DC
Start: 2023-10-03 — End: 2024-07-24
  Filled 2023-10-03: qty 30, 30d supply, fill #0
  Filled 2023-11-28: qty 30, 30d supply, fill #1
  Filled 2024-01-16: qty 30, 30d supply, fill #2
  Filled 2024-03-11: qty 30, 30d supply, fill #3

## 2023-10-03 MED ORDER — BUPROPION HCL ER (XL) 150 MG PO TB24
150.0000 mg | ORAL_TABLET | Freq: Every day | ORAL | 1 refills | Status: DC
Start: 2023-10-03 — End: 2023-12-13
  Filled 2023-10-03: qty 30, 30d supply, fill #0
  Filled 2023-11-06: qty 30, 30d supply, fill #1

## 2023-10-03 NOTE — Progress Notes (Signed)
Chief Complaint  Patient presents with   Weight Loss    Weight loss     Subjective: Patient is a 62 y.o. female here for discussion of weight.  Pt used to be on Wegovy but her ins stopped covering. Was on phentermine for a short period of time which did help. Diet is fair. Walking and playing pickle ball for exercise. She does state she will eat to cope w stress.   Several months of bilateral outer hip pain and bilateral knee pain.  She does have associated back pain.  No specific injury or change in activity.  Sometimes rolling over in bed affects the hip she is laying on.  No bruising, redness, swelling, or neurologic signs or symptoms.  Meloxicam has helped previously for neck pain.  Past Medical History:  Diagnosis Date   Anemia    Hamstring injury    Hx of gastric bypass 2004   Indigestion    Obesity    Post-operative nausea and vomiting    Varicose veins     Objective: BP 124/72 (BP Location: Left Arm, Patient Position: Sitting, Cuff Size: Normal)   Pulse 72   Temp 98 F (36.7 C) (Oral)   Resp 16   Ht 5\' 6"  (1.676 m)   Wt 200 lb (90.7 kg)   SpO2 97%   BMI 32.28 kg/m  General: Awake, appears stated age Lungs: No accessory muscle use MSK: TTP over the gluteus medius musculature bilaterally and greater trochanteric region bilaterally; no TTP over the SI joints or lumbar paraspinal musculature Neuro: DTRs equal and symmetric in the lower extremities bilaterally, no clonus, no cerebellar signs Psych: Age appropriate judgment and insight, normal affect and mood  Assessment and Plan: Obesity (BMI 30-39.9) - Plan: buPROPion (WELLBUTRIN XL) 150 MG 24 hr tablet  Tendinopathy of gluteus medius - Plan: meloxicam (MOBIC) 15 MG tablet  Chronic, uncontrolled. Changing ins so will hopefully have access to weekly injectables.  Start Wellbutrin XL 150 mg daily to help with stress eating.  She will let me know if insurance covers weekly injections for weight loss.  Counseled on diet  and exercise. Stretches and exercises for the IT band and gluteus medius provided.  Heat, ice, Tylenol.  No improvement, will consider injections versus physical therapy. The patient voiced understanding and agreement to the plan.  Jilda Roche [place], DO 10/03/23  4:41 PM

## 2023-10-03 NOTE — Patient Instructions (Addendum)
Keep the diet clean and stay active.  Aim to do some physical exertion for 150 minutes per week. This is typically divided into 5 days per week, 30 minutes per day. The activity should be enough to get your heart rate up. Anything is better than nothing if you have time constraints.  Please consider adding some weight resistance exercise to your routine. Consider yoga as well.   Foods that may reduce pain: 1) Ginger 2) Blueberries 3) Salmon 4) Pumpkin seeds 5) Dark chocolate 6) Turmeric 7) Tart cherries 8) Virgin olive oil 9) Chili peppers 10) Mint 11) Krill oil  Let us know if you need anything.  Iliotibial Band Syndrome Rehab It is normal to feel mild stretching, pulling, tightness, or discomfort as you do these exercises, but you should stop right away if you feel sudden pain or your pain gets worse.  Stretching and range of motion exercises These exercises warm up your muscles and joints and improve the movement and flexibility of your hip and pelvis. Exercise A: Quadriceps, prone    Lie on your abdomen on a firm surface, such as a bed or padded floor. Bend your left / right knee and hold your ankle. If you cannot reach your ankle or pant leg, loop a belt around your foot and grab the belt instead. Gently pull your heel toward your buttocks. Your knee should not slide out to the side. You should feel a stretch in the front of your thigh and knee. Hold this position for 30 seconds. Repeat 2 times. Complete this stretch 3 times per week. Exercise B: Iliotibial band    Lie on your side with your left / right leg in the top position. Bend both of your knees and grab your left / right ankle. Stretch out your bottom arm to help you balance. Slowly bring your top knee back so your thigh goes behind your trunk. Slowly lower your top leg toward the floor until you feel a gentle stretch on the outside of your left / right hip and thigh. If you do not feel a stretch and your knee will  not fall farther, place the heel of your other foot on top of your knee and pull your knee down toward the floor with your foot. Hold this position for 30 seconds. Repeat 2 times. Complete this stretch 3 times per week. Strengthening exercises These exercises build strength and endurance in your hip and pelvis. Endurance is the ability to use your muscles for a long time, even after they get tired. Exercise C: Straight leg raises (hip abductors)     Lie on your side with your left / right leg in the top position. Lie so your head, shoulder, knee, and hip line up. You may bend your bottom knee to help you balance. Roll your hips slightly forward so your hips are stacked directly over each other and your left / right knee is facing forward. Tense the muscles in your outer thigh and lift your top leg 4-6 inches (10-15 cm). Hold this position for 3 seconds. Repeat for a total of 10 reps. Slowly return to the starting position. Let your muscles relax completely before doing another repetition. Repeat 2 times. Complete this exercise 3 times per week. Exercise D: Straight leg raises (hip extensors) Lie on your abdomen on your bed or a firm surface. You can put a pillow under your hips if that is more comfortable. Bend your left / right knee so your foot is straight  up in the air. Squeeze your buttock muscles and lift your left / right thigh off the bed. Do not let your back arch. Tense this muscle as hard as you can without increasing any knee pain. Hold this position for 2 seconds. Repeat for a total of 10 reps Slowly lower your leg to the starting position and allow it to relax completely. Repeat 2 times. Complete this exercise 3 times per week. Exercise E: Hip hike Stand sideways on a bottom step. Stand on your left / right leg with your other foot unsupported next to the step. You can hold onto the railing or wall if needed for balance. Keep your knees straight and your torso square. Then, lift  your left / right hip up toward the ceiling. Slowly let your left / right hip lower toward the floor, past the starting position. Your foot should get closer to the floor. Do not lean or bend your knees. Repeat 2 times. Complete this exercise 3 times per week.  Document Released: 11/07/2005 Document Revised: 07/12/2016 Document Reviewed: 10/09/2015 Elsevier Interactive Patient Education  2018 [employer]   Gluteus Medius Syndrome Rehab It is normal to feel mild stretching, pulling, tightness, or discomfort as you do these exercises, but you should stop right away if you feel sudden pain or your pain gets worse.   Stretching and range of motion exercise This exercise warms up your muscles and joints and improves the movement and flexibility of your hip and pelvis. This exercise also helps to relieve pain and stiffness. Exercise A: Lunge (hip flexor stretch)      Kneel on the floor on your left / right knee. Bend your other knee so it is directly over your ankle. Keep good posture with your head over your shoulders. Tuck your tailbone underneath you. This will prevent your back from arching too much. You should feel a gentle stretch in the front of your thigh or hip. If you do not feel a stretch, slowly lunge forward with your chest up. Hold this position for 30 seconds. Slowly return to the starting position. Repeat 2 times. Complete this exercise 3 times per week. Strengthening exercises These exercises build strength and endurance in your hip and pelvis. Endurance is the ability to use your muscles for a long time, even after they get tired. Exercise B: Bridge (hip extensors)     Lie on your back on a firm surface with your knees bent and your feet flat on the floor. Tighten your buttocks muscles and lift your bottom off the floor until the trunk of your body is level with your thighs. You should feel the muscles working in your buttocks and the back of your thighs. If this exercise  is too easy, cross your arms over your chest or lift one leg while your bottom is up off the floor. Do not arch your back. Hold this position for 3 seconds. Slowly lower your hips to the starting position. Let your muscles relax completely between repetitions. Repeat 2 times. Complete this exercise 3 times per week. Exercise C: Straight leg raises (hip abductors)     Lie on your side with your left / right leg in the top position. Lie so your head, shoulder, knee, and hip line up. Bend your bottom knee to help you balance. Lift your top leg up 4-6 inches (10-15 cm), keeping your toes pointed straight ahead. Hold this position for 2 seconds. Slowly lower your leg to the starting position and let your  muscles relax completely. Repeat for a total of 10 repetitions. Repeat 2 times. Complete this exercise 3 times per week. Exercise D: Hip abductors and external rotators, quadruped Get on your hands and knees on a firm, lightly padded surface. Your hands should be directly below your shoulders, and your knees should be directly below your hips. Lift your left / right knee out to the side. Keep your knee bent. Do not twist your body. Hold this position for 3 seconds. Slowly lower your leg. Repeat for a total of 10 repetitions.  Repeat 2 times. Complete this exercise 3 times per week. Exercise E: Single leg stand Stand near a counter or door frame to hold onto as needed. It is helpful to look in a mirror for this exercise so you can watch your hip. Squeeze your left / right buttock muscles then lift up your other foot. Do not let your left / right hip push out to the side. Hold this position for 3 seconds. Repeat for a total of 10 repetitions. Repeat 2 times. Complete this exercise 3 times per week. Make sure you discuss any questions you have with your health care provider. Document Released: 11/07/2005 Document Revised: 07/14/2016 Document Reviewed: 10/20/2015 Elsevier Interactive Patient  Education  [employer].

## 2023-11-23 ENCOUNTER — Other Ambulatory Visit (HOSPITAL_BASED_OUTPATIENT_CLINIC_OR_DEPARTMENT_OTHER): Payer: Self-pay

## 2023-12-13 ENCOUNTER — Other Ambulatory Visit (HOSPITAL_BASED_OUTPATIENT_CLINIC_OR_DEPARTMENT_OTHER): Payer: Self-pay

## 2023-12-13 ENCOUNTER — Other Ambulatory Visit: Payer: Self-pay | Admitting: Family Medicine

## 2023-12-13 DIAGNOSIS — E669 Obesity, unspecified: Secondary | ICD-10-CM

## 2023-12-13 MED ORDER — BUPROPION HCL ER (XL) 150 MG PO TB24
150.0000 mg | ORAL_TABLET | Freq: Every day | ORAL | 0 refills | Status: DC
Start: 2023-12-13 — End: 2024-03-11
  Filled 2023-12-13: qty 90, 90d supply, fill #0

## 2023-12-14 ENCOUNTER — Other Ambulatory Visit (HOSPITAL_COMMUNITY): Payer: Self-pay

## 2023-12-14 ENCOUNTER — Encounter: Payer: Self-pay | Admitting: Family Medicine

## 2023-12-14 ENCOUNTER — Other Ambulatory Visit: Payer: Self-pay | Admitting: Family Medicine

## 2023-12-14 MED ORDER — SEMAGLUTIDE-WEIGHT MANAGEMENT 0.25 MG/0.5ML ~~LOC~~ SOAJ
0.2500 mg | SUBCUTANEOUS | 0 refills | Status: DC
  Filled 2023-12-14 – 2023-12-15 (×2): qty 2, 28d supply, fill #0

## 2023-12-14 MED ORDER — SEMAGLUTIDE-WEIGHT MANAGEMENT 1 MG/0.5ML ~~LOC~~ SOAJ
1.0000 mg | SUBCUTANEOUS | 0 refills | Status: DC
  Filled 2023-12-14: qty 2, 28d supply, fill #0

## 2023-12-14 MED ORDER — SEMAGLUTIDE-WEIGHT MANAGEMENT 2.4 MG/0.75ML ~~LOC~~ SOAJ
2.4000 mg | SUBCUTANEOUS | 0 refills | Status: DC
  Filled 2023-12-14: qty 3, 28d supply, fill #0

## 2023-12-14 MED ORDER — SEMAGLUTIDE-WEIGHT MANAGEMENT 1.7 MG/0.75ML ~~LOC~~ SOAJ
1.7000 mg | SUBCUTANEOUS | 0 refills | Status: DC
  Filled 2023-12-14: qty 3, 28d supply, fill #0

## 2023-12-14 MED ORDER — SEMAGLUTIDE-WEIGHT MANAGEMENT 0.5 MG/0.5ML ~~LOC~~ SOAJ
0.5000 mg | SUBCUTANEOUS | 0 refills | Status: DC
  Filled 2023-12-14: qty 2, 28d supply, fill #0

## 2023-12-15 ENCOUNTER — Other Ambulatory Visit (HOSPITAL_COMMUNITY): Payer: Self-pay

## 2023-12-15 ENCOUNTER — Other Ambulatory Visit: Payer: Self-pay | Admitting: Family Medicine

## 2023-12-15 ENCOUNTER — Other Ambulatory Visit (HOSPITAL_BASED_OUTPATIENT_CLINIC_OR_DEPARTMENT_OTHER): Payer: Self-pay

## 2023-12-25 ENCOUNTER — Other Ambulatory Visit (HOSPITAL_BASED_OUTPATIENT_CLINIC_OR_DEPARTMENT_OTHER): Payer: Self-pay

## 2023-12-25 ENCOUNTER — Other Ambulatory Visit: Payer: Self-pay | Admitting: Family Medicine

## 2023-12-25 DIAGNOSIS — E669 Obesity, unspecified: Secondary | ICD-10-CM

## 2023-12-27 ENCOUNTER — Other Ambulatory Visit (HOSPITAL_BASED_OUTPATIENT_CLINIC_OR_DEPARTMENT_OTHER): Payer: Self-pay

## 2024-01-16 ENCOUNTER — Encounter: Payer: Self-pay | Admitting: Family Medicine

## 2024-01-18 ENCOUNTER — Other Ambulatory Visit (HOSPITAL_BASED_OUTPATIENT_CLINIC_OR_DEPARTMENT_OTHER): Payer: Self-pay

## 2024-01-23 ENCOUNTER — Other Ambulatory Visit (HOSPITAL_BASED_OUTPATIENT_CLINIC_OR_DEPARTMENT_OTHER): Payer: Self-pay

## 2024-01-23 ENCOUNTER — Telehealth (INDEPENDENT_AMBULATORY_CARE_PROVIDER_SITE_OTHER): Payer: 59 | Admitting: Family Medicine

## 2024-01-23 ENCOUNTER — Encounter: Payer: Self-pay | Admitting: Family Medicine

## 2024-01-23 VITALS — Ht 66.0 in | Wt 188.0 lb

## 2024-01-23 DIAGNOSIS — E6609 Other obesity due to excess calories: Secondary | ICD-10-CM | POA: Diagnosis not present

## 2024-01-23 DIAGNOSIS — E66811 Obesity, class 1: Secondary | ICD-10-CM

## 2024-01-23 DIAGNOSIS — Z6832 Body mass index (BMI) 32.0-32.9, adult: Secondary | ICD-10-CM

## 2024-01-23 MED ORDER — PHENTERMINE HCL 37.5 MG PO CAPS
37.5000 mg | ORAL_CAPSULE | ORAL | 0 refills | Status: DC
  Filled 2024-01-23: qty 30, 30d supply, fill #0

## 2024-01-23 MED ORDER — PHENTERMINE HCL 37.5 MG PO CAPS
37.5000 mg | ORAL_CAPSULE | ORAL | 0 refills | Status: DC
  Filled 2024-03-11: qty 30, 30d supply, fill #0

## 2024-01-23 MED ORDER — PHENTERMINE HCL 37.5 MG PO CAPS
37.5000 mg | ORAL_CAPSULE | ORAL | 0 refills | Status: DC
  Filled 2024-04-09: qty 30, 30d supply, fill #0

## 2024-01-23 NOTE — Progress Notes (Signed)
 Chief Complaint  Patient presents with   Weight Loss    Subjective: Patient is a 63 y.o. female here for weight issues. We are interacting via web portal for an electronic face-to-face visit. I verified patient's ID using 2 identifiers. Patient agreed to proceed with visit via this method. Patient is at home, I am at office. Patient and I are present for visit.   Pt has hx of obesity. She is doing her best with her diet. She has random cravings and dealing with hunger. She has lost 12 lbs in the last 5 weeks. She had gained to over 200 lbs since stopping the [facility]. Ins stopped covering. She has been playing pickleball and doing some line dancing.   Past Medical History:  Diagnosis Date   Anemia    Hamstring injury    Hx of gastric bypass 2004   Indigestion    Obesity    Post-operative nausea and vomiting    Varicose veins     Objective: Ht 5\' 6"  (1.676 m)   Wt 188 lb (85.3 kg)   BMI 30.34 kg/m  No conversational dyspnea Age appropriate judgment and insight Nml affect and mood  Assessment and Plan: Class 1 obesity due to excess calories without serious comorbidity with body mass index (BMI) of 32.0 to 32.9 in adult - Plan: phentermine 37.5 MG capsule, phentermine 37.5 MG capsule, phentermine 37.5 MG capsule  Chronic, uncontrolled.  Counseled on diet and exercise.  Restart phentermine 37.5 mg daily for 90 days.  She has historically tolerated this well.  It has been over a year since she was most recently on it.  I will see her in 6 months for a physical or as needed. The patient voiced understanding and agreement to the plan.  Jilda Roche [place], DO 01/23/24  2:35 PM

## 2024-03-11 ENCOUNTER — Other Ambulatory Visit: Payer: Self-pay

## 2024-03-11 ENCOUNTER — Other Ambulatory Visit (HOSPITAL_BASED_OUTPATIENT_CLINIC_OR_DEPARTMENT_OTHER): Payer: Self-pay

## 2024-03-11 ENCOUNTER — Other Ambulatory Visit: Payer: Self-pay | Admitting: Family Medicine

## 2024-03-11 DIAGNOSIS — E669 Obesity, unspecified: Secondary | ICD-10-CM

## 2024-03-11 MED ORDER — BUPROPION HCL ER (XL) 150 MG PO TB24
150.0000 mg | ORAL_TABLET | Freq: Every day | ORAL | 0 refills | Status: DC
  Filled 2024-03-11: qty 90, 90d supply, fill #0

## 2024-04-09 ENCOUNTER — Other Ambulatory Visit (HOSPITAL_BASED_OUTPATIENT_CLINIC_OR_DEPARTMENT_OTHER): Payer: Self-pay

## 2024-04-12 ENCOUNTER — Other Ambulatory Visit (HOSPITAL_BASED_OUTPATIENT_CLINIC_OR_DEPARTMENT_OTHER): Payer: Self-pay | Admitting: Family Medicine

## 2024-04-12 DIAGNOSIS — Z1231 Encounter for screening mammogram for malignant neoplasm of breast: Secondary | ICD-10-CM

## 2024-05-22 ENCOUNTER — Telehealth (HOSPITAL_BASED_OUTPATIENT_CLINIC_OR_DEPARTMENT_OTHER): Payer: Self-pay

## 2024-05-23 ENCOUNTER — Inpatient Hospital Stay (HOSPITAL_BASED_OUTPATIENT_CLINIC_OR_DEPARTMENT_OTHER): Admission: RE | Admit: 2024-05-23 | Source: Ambulatory Visit

## 2024-05-27 ENCOUNTER — Encounter: Payer: Self-pay | Admitting: Family Medicine

## 2024-05-27 ENCOUNTER — Other Ambulatory Visit: Payer: Self-pay | Admitting: Family Medicine

## 2024-05-27 DIAGNOSIS — E6609 Other obesity due to excess calories: Secondary | ICD-10-CM

## 2024-05-28 ENCOUNTER — Other Ambulatory Visit (HOSPITAL_BASED_OUTPATIENT_CLINIC_OR_DEPARTMENT_OTHER): Payer: Self-pay

## 2024-06-03 ENCOUNTER — Other Ambulatory Visit (HOSPITAL_BASED_OUTPATIENT_CLINIC_OR_DEPARTMENT_OTHER): Payer: Self-pay

## 2024-06-03 ENCOUNTER — Other Ambulatory Visit: Payer: Self-pay | Admitting: Family Medicine

## 2024-06-03 DIAGNOSIS — G4700 Insomnia, unspecified: Secondary | ICD-10-CM

## 2024-06-03 DIAGNOSIS — E669 Obesity, unspecified: Secondary | ICD-10-CM

## 2024-06-03 MED ORDER — BUPROPION HCL ER (XL) 150 MG PO TB24
150.0000 mg | ORAL_TABLET | Freq: Every day | ORAL | 2 refills | Status: AC
  Filled 2024-06-03: qty 90, 90d supply, fill #0
  Filled 2024-10-15 (×2): qty 90, 90d supply, fill #1

## 2024-06-03 MED ORDER — TRAZODONE HCL 50 MG PO TABS
25.0000 mg | ORAL_TABLET | Freq: Every evening | ORAL | 2 refills | Status: AC | PRN
Start: 2024-06-03 — End: 2025-06-03
  Filled 2024-06-03: qty 90, 90d supply, fill #0
  Filled 2024-10-15 (×2): qty 90, 90d supply, fill #1

## 2024-06-17 ENCOUNTER — Encounter (HOSPITAL_BASED_OUTPATIENT_CLINIC_OR_DEPARTMENT_OTHER): Payer: Self-pay

## 2024-06-17 ENCOUNTER — Ambulatory Visit (HOSPITAL_BASED_OUTPATIENT_CLINIC_OR_DEPARTMENT_OTHER)
Admission: RE | Admit: 2024-06-17 | Discharge: 2024-06-17 | Disposition: A | Discharge status: Home / Self Care | Source: Ambulatory Visit | Attending: Family Medicine | Admitting: Family Medicine

## 2024-06-17 DIAGNOSIS — Z1231 Encounter for screening mammogram for malignant neoplasm of breast: Secondary | ICD-10-CM | POA: Insufficient documentation

## 2024-06-20 ENCOUNTER — Other Ambulatory Visit: Payer: Self-pay | Admitting: Family Medicine

## 2024-06-20 DIAGNOSIS — R928 Other abnormal and inconclusive findings on diagnostic imaging of breast: Secondary | ICD-10-CM

## 2024-07-02 ENCOUNTER — Encounter

## 2024-07-02 ENCOUNTER — Other Ambulatory Visit

## 2024-07-03 ENCOUNTER — Ambulatory Visit

## 2024-07-03 ENCOUNTER — Ambulatory Visit
Admission: RE | Admit: 2024-07-03 | Discharge: 2024-07-03 | Disposition: A | Discharge status: Home / Self Care | Source: Ambulatory Visit | Attending: Family Medicine | Admitting: Family Medicine

## 2024-07-03 DIAGNOSIS — R928 Other abnormal and inconclusive findings on diagnostic imaging of breast: Secondary | ICD-10-CM

## 2024-07-23 ENCOUNTER — Encounter: Payer: Self-pay | Admitting: Family Medicine

## 2024-07-24 ENCOUNTER — Ambulatory Visit: Admitting: Family Medicine

## 2024-07-24 ENCOUNTER — Encounter: Payer: Self-pay | Admitting: Family Medicine

## 2024-07-24 ENCOUNTER — Other Ambulatory Visit (HOSPITAL_BASED_OUTPATIENT_CLINIC_OR_DEPARTMENT_OTHER): Payer: Self-pay

## 2024-07-24 VITALS — BP 118/66 | HR 88 | Temp 98.5°F | Resp 18 | Ht 66.0 in | Wt 190.0 lb

## 2024-07-24 DIAGNOSIS — M7752 Other enthesopathy of left foot: Secondary | ICD-10-CM | POA: Diagnosis not present

## 2024-07-24 DIAGNOSIS — Z23 Encounter for immunization: Secondary | ICD-10-CM | POA: Diagnosis not present

## 2024-07-24 MED ORDER — MELOXICAM 15 MG PO TABS
15.0000 mg | ORAL_TABLET | Freq: Every day | ORAL | 3 refills | Status: AC
  Filled 2024-07-24: qty 30, 30d supply, fill #0
  Filled 2024-08-18: qty 30, 30d supply, fill #1
  Filled 2024-10-15 (×2): qty 30, 30d supply, fill #2
  Filled 2024-11-19: qty 30, 30d supply, fill #3

## 2024-07-24 NOTE — Patient Instructions (Signed)
 Listen to your body.   Ice/cold pack over area for 10-15 min twice daily.  OK to take Tylenol  1000 mg (2 extra strength tabs) or 975 mg (3 regular strength tabs) every 6 hours as needed.  Take the meloxicam  daily for 10 days and then daily as needed.   Let us  know if you need anything.  Achilles Tendinitis Rehab It is normal to feel mild stretching, pulling, tightness, or discomfort as you do these exercises, but you should stop right away if you feel sudden pain or your pain gets worse.   Stretching and range of motion exercises These exercises warm up your muscles and joints and improve the movement and flexibility of your ankle. These exercises also help to relieve pain, numbness, and tingling. Exercise A: Standing wall calf stretch, knee straight  Stand with your hands against a wall. Extend your affected leg behind you and bend your front knee slightly. Keep both of your heels on the floor. Point the toes of your back foot slightly inward. Keeping your heels on the floor and your back knee straight, shift your weight toward the wall. Do not allow your back to arch. You should feel a gentle stretch in your calf. Hold this position for seconds. Repeat 2 times. Complete this stretch 3 times per week Exercise B: Standing wall calf stretch, knee bent Stand with your hands against a wall. Extend your affected leg behind you, and bend your front knee slightly. Keep both of your heels on the floor. Point the toes of your back foot slightly inward. Keeping your heels on the floor, unlock your back knee so that it is bent. You should feel a gentle stretch deep in your calf. Hold this position for 30 seconds. Repeat 2 times. Complete this stretch 3 times per week.  Strengthening exercises These exercises build strength and control of your ankle. Endurance is the ability to use your muscles for a long time, even after they get tired. Exercise C: Plantar flexion with band  Sit on the  floor with your affected leg extended. You may put a pillow under your calf to give your foot more room to move. Loop a rubber exercise band or tube around the ball of your affected foot. The ball of your foot is on the walking surface, right under your toes. The band or tube should be slightly tense when your foot is relaxed. If the band or tube slips, you can put on your shoe or put a washcloth between the band and your foot to help it stay in place. Slowly point your toes downward, pushing them away from you. Hold this position for 10 seconds. Slowly release the tension in the band or tube, controlling smoothly until your foot is back to the starting position. Repeat 2 times. Complete this exercise 3 times per week. Exercise D: Heel raise with eccentric lower  Stand on a step with the balls of your feet. The ball of your foot is on the walking surface, right under your toes. Do not put your heels on the step. For balance, rest your hands on the wall or on a railing. Rise up onto the balls of your feet. Keeping your heels up, shift all of your weight to your affected leg and pick up your other leg. Slowly lower your affected leg so your heel drops below the level of the step. Put down your foot. If told by your health care provider, build up to: 3 sets of 15 repetitions while  keeping your knees straight. 3 sets of 15 repetitions while keeping your knees bent as far as told by your health care provider.  Complete this exercise 3 times per week. If this exercise is too easy, try doing it while wearing a backpack with weights in it. Balance exercises These exercises improve or maintain your balance. Balance is important in preventing falls. Exercise E: Single leg stand Without shoes, stand near a railing or in a door frame. Hold on to the railing or door frame as needed. Stand on your affected foot. Keep your big toe down on the floor and try to keep your arch lifted. Hold this position for  10 seconds. Repeat 2 times. Complete this exercise 3 times per week. If this exercise is too easy, you can try it with your eyes closed or while standing on a pillow. Make sure you discuss any questions you have with your health care provider. Document Released: 06/08/2005 Document Revised: 07/14/2016 Document Reviewed: 07/14/2015 Elsevier Interactive Patient Education  [employer].

## 2024-07-24 NOTE — Progress Notes (Signed)
 Musculoskeletal Exam  Patient: Kristie Martinez: 06-26-1961  DOS: 07/24/2024  SUBJECTIVE:  Chief Complaint:   Chief Complaint  Patient presents with   Ankle Pain    Left-- injury     Kristie Martinez is a 63 y.o.  female for evaluation and treatment of L heel pain.   Onset:  1 month ago. Strained his L heel.   Location: L achilles tendon Character:  aching  Progression of issue:  steadily improves and then strains when she plays pickle ball Associated symptoms: no redness, bruising, swelling.  Treatment: to date has been rest, prescription NSAIDS, and Voltaren gel.   Neurovascular symptoms: no  Past Medical History:  Diagnosis Date   Anemia    Hamstring injury    Hx of gastric bypass 2004   Indigestion    Obesity    Post-operative nausea and vomiting    Varicose veins     Objective: VITAL SIGNS: BP 118/66   Pulse 88   Temp 98.5 F (36.9 C)   Resp 18   Ht 5' 6 (1.676 m)   Wt 190 lb (86.2 kg)   SpO2 98%   BMI 30.67 kg/m  Constitutional: Well formed, well developed. No acute distress. Thorax & Lungs: No accessory muscle use Musculoskeletal: L ankle.   Normal active range of motion: yes.   Normal passive range of motion: yes Tenderness to palpation: yes over calcaneal bursa; no bony ttp, no ttp over achilles Deformity: no Ecchymosis: no Tests positive: none Tests negative: squeeze Neurologic: Normal sensory function.  Psychiatric: Normal mood. Age appropriate judgment and insight. Alert & oriented x 3.    Assessment:  Calcaneal bursitis (heel), left - Plan: meloxicam  (MOBIC ) 15 MG tablet  Need for influenza vaccination - Plan: Flu vaccine trivalent PF, 6mos and older(Flulaval ,Afluria,Fluarix,Fluzone)  Plan: Stretches/exercises, heat, ice, Tylenol . Mobic  daily for 7-10 d and then prn afterwards. Could consider a boot if not improving.  F/u as originally scheduled. The patient voiced understanding and agreement to the plan.   Mabel [place],  DO 07/24/24  3:15 PM

## 2024-07-24 NOTE — Telephone Encounter (Signed)
 Called pt appt today at 230.

## 2024-08-18 ENCOUNTER — Encounter: Payer: Self-pay | Admitting: Family Medicine

## 2024-08-18 DIAGNOSIS — E611 Iron deficiency: Secondary | ICD-10-CM

## 2024-08-18 DIAGNOSIS — E785 Hyperlipidemia, unspecified: Secondary | ICD-10-CM

## 2024-08-18 DIAGNOSIS — Z833 Family history of diabetes mellitus: Secondary | ICD-10-CM

## 2024-08-19 ENCOUNTER — Other Ambulatory Visit: Payer: Self-pay

## 2024-08-19 DIAGNOSIS — M79672 Pain in left foot: Secondary | ICD-10-CM

## 2024-08-19 NOTE — Telephone Encounter (Signed)
 Called pt and lvm and sent her message to call setup lab appt, CPE and EKG.

## 2024-08-20 ENCOUNTER — Ambulatory Visit (HOSPITAL_BASED_OUTPATIENT_CLINIC_OR_DEPARTMENT_OTHER)
Admission: RE | Admit: 2024-08-20 | Discharge: 2024-08-20 | Disposition: A | Discharge status: Home / Self Care | Source: Ambulatory Visit

## 2024-08-20 ENCOUNTER — Ambulatory Visit

## 2024-08-20 ENCOUNTER — Other Ambulatory Visit (HOSPITAL_BASED_OUTPATIENT_CLINIC_OR_DEPARTMENT_OTHER): Payer: Self-pay

## 2024-08-20 ENCOUNTER — Other Ambulatory Visit: Payer: Self-pay

## 2024-08-20 ENCOUNTER — Ambulatory Visit: Payer: Self-pay

## 2024-08-20 VITALS — BP 112/70 | Ht 66.0 in | Wt 190.0 lb

## 2024-08-20 DIAGNOSIS — M79672 Pain in left foot: Secondary | ICD-10-CM | POA: Diagnosis not present

## 2024-08-20 DIAGNOSIS — M7660 Achilles tendinitis, unspecified leg: Secondary | ICD-10-CM | POA: Diagnosis not present

## 2024-08-20 DIAGNOSIS — S86012A Strain of left Achilles tendon, initial encounter: Secondary | ICD-10-CM

## 2024-08-20 MED ORDER — NAPROXEN 500 MG PO TABS
500.0000 mg | ORAL_TABLET | Freq: Two times a day (BID) | ORAL | 1 refills | Status: DC
  Filled 2024-08-20: qty 42, 21d supply, fill #0
  Filled 2024-09-05: qty 42, 21d supply, fill #1

## 2024-08-20 NOTE — Progress Notes (Signed)
   Subjective:    Patient ID: Karna JAYSON Serve, female    DOB: 63 y.o., November 10, 1961   MRN: [id number]  HPI  Chief Complaint: Left heel pain  Patient is an avid pickleball player In July she noted feeling a pop while playing pickle ball in her posterior left heel Pain followed immediately and has lasted for quite a while since then Has been using wraps, ice, elevation and initiated a home exercise program.  Voltaren gel and meloxicam  have also been included in her treatment regimen (partially directed by her primary care physician who on 07/24/2024 diagnosed her with calcaneal bursitis) She then reports that after modifying her activities and abstaining from pickleball play, she resumed this 3 days ago and experienced a pop in the posterior heel similar to what she had in July.  Pain has ensued since and she has been unable to control this.   Objective:   Physical Exam Vitals:   08/20/24 1004  BP: 112/70   Left heel: Swelling observed near the insertion of the Achilles.  Tenderness palpating this area.  Increased warmth compared to contralateral heel.  No significant overlying skin changes.  Mild pain with resisted plantarflexion.  No pain with resisted dorsiflexion.  No pain over medial, lateral malleoli.  No tenderness over medial calcaneal tuberosity.  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 female presenting  with an acute on chronic injury to her left heel.  On exam she has exquisite tenderness around the Achilles insertion point and ultrasound she appears to have intrasubstance tear at the footprint of the Achilles attaching to the calcaneus with some other evidence of chronic insertional Achilles tendinopathy evident.  I recommend that she engage with physical therapy, use only ground-level exercises, and providing her with a heel lift today, I recommend follow-up in 6-8 weeks, and we will obtain x-rays on her way out the door today to fully characterize her ankle anatomy and ensure that what appeared to be the enthesophyte exchange noted at her calcaneus is not in fact a large tendon calcification.

## 2024-08-27 ENCOUNTER — Other Ambulatory Visit

## 2024-08-27 DIAGNOSIS — E785 Hyperlipidemia, unspecified: Secondary | ICD-10-CM | POA: Diagnosis not present

## 2024-08-27 DIAGNOSIS — Z833 Family history of diabetes mellitus: Secondary | ICD-10-CM

## 2024-08-27 DIAGNOSIS — E611 Iron deficiency: Secondary | ICD-10-CM

## 2024-08-27 LAB — CBC WITH DIFFERENTIAL/PLATELET
Basophils Absolute: 0 K/uL (ref 0.0–0.1)
Basophils Relative: 0.6 % (ref 0.0–3.0)
Eosinophils Absolute: 0.3 K/uL (ref 0.0–0.7)
Eosinophils Relative: 5.1 % — ABNORMAL HIGH (ref 0.0–5.0)
HCT: 43.4 % (ref 36.0–46.0)
Hemoglobin: 14.5 g/dL (ref 12.0–15.0)
Lymphocytes Relative: 24.3 % (ref 12.0–46.0)
Lymphs Abs: 1.6 K/uL (ref 0.7–4.0)
MCHC: 33.3 g/dL (ref 30.0–36.0)
MCV: 93.9 fl (ref 78.0–100.0)
Monocytes Absolute: 0.5 K/uL (ref 0.1–1.0)
Monocytes Relative: 7.8 % (ref 3.0–12.0)
Neutro Abs: 4 K/uL (ref 1.4–7.7)
Neutrophils Relative %: 62.2 % (ref 43.0–77.0)
Platelets: 324 K/uL (ref 150.0–400.0)
RBC: 4.63 Mil/uL (ref 3.87–5.11)
RDW: 12.6 % (ref 11.5–15.5)
WBC: 6.5 K/uL (ref 4.0–10.5)

## 2024-08-27 LAB — LIPID PANEL
Cholesterol: 196 mg/dL (ref 0–200)
HDL: 59 mg/dL (ref >39.00)
LDL Cholesterol: 121 mg/dL — ABNORMAL HIGH (ref 0–99)
NonHDL: 136.84
Total CHOL/HDL Ratio: 3
Triglycerides: 81 mg/dL (ref 0.0–149.0)
VLDL: 16.2 mg/dL (ref 0.0–40.0)

## 2024-08-27 LAB — COMPREHENSIVE METABOLIC PANEL WITH GFR
ALT: 19 U/L (ref 0–35)
AST: 21 U/L (ref 0–37)
Albumin: 4.5 g/dL (ref 3.5–5.2)
Alkaline Phosphatase: 86 U/L (ref 39–117)
BUN: 18 mg/dL (ref 6–23)
CO2: 32 meq/L (ref 19–32)
Calcium: 9.6 mg/dL (ref 8.4–10.5)
Chloride: 101 meq/L (ref 96–112)
Creatinine, Ser: 0.58 mg/dL (ref 0.40–1.20)
GFR: 97.6 mL/min (ref >60.00)
Glucose, Bld: 79 mg/dL (ref 70–99)
Potassium: 4.4 meq/L (ref 3.5–5.1)
Sodium: 140 meq/L (ref 135–145)
Total Bilirubin: 0.6 mg/dL (ref 0.2–1.2)
Total Protein: 6.9 g/dL (ref 6.0–8.3)

## 2024-08-27 LAB — IRON,TIBC AND FERRITIN PANEL
%SAT: 52 % — ABNORMAL HIGH (ref 16–45)
Ferritin: 59 ng/mL (ref 16–288)
Iron: 174 ug/dL — ABNORMAL HIGH (ref 45–160)
TIBC: 337 ug/dL (ref 250–450)

## 2024-10-15 ENCOUNTER — Ambulatory Visit

## 2024-10-15 ENCOUNTER — Other Ambulatory Visit (HOSPITAL_BASED_OUTPATIENT_CLINIC_OR_DEPARTMENT_OTHER): Payer: Self-pay

## 2024-10-15 ENCOUNTER — Other Ambulatory Visit: Payer: Self-pay

## 2024-10-15 VITALS — BP 120/86 | Ht 66.0 in | Wt 190.0 lb

## 2024-10-15 DIAGNOSIS — S86012A Strain of left Achilles tendon, initial encounter: Secondary | ICD-10-CM

## 2024-10-15 DIAGNOSIS — M7660 Achilles tendinitis, unspecified leg: Secondary | ICD-10-CM

## 2024-10-15 NOTE — Progress Notes (Signed)
   Subjective:    Patient ID: Kristie Martinez, female    DOB: 63 y.o., [birth year]   MRN: [id number]  Chief Complaint: Left Insertional Achilles tendinopathy follow up (6 weeks)  Discussed the use of AI scribe software for clinical note transcription with the patient, who gave verbal consent to proceed.  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  X-rays at that time showed prominent enthesophyte.  Started on ground level physical therapy exercises, provided with heel lifts, recommended 6-week follow-up.  Today she reports she feels approximately 70% improved      Objective:   Vitals:   10/15/24 0804  BP: 120/86    Const: appears well, non-toxic, well groomed Psych: affect bright, interactive, smiling EENT: EOMI intact, conjunctiva appear normal Neck: no obvious masses, appears symmetric Resp: non-labored, appears symmetric Neuro: muscle bulk appears normal Skin: no obvious rashes noted  Left ankle: Moderate tenderness to palpation with an anteriorly directed pressure at the posterior aspect of the heel near the calcaneal insertion of the Achilles tendon. No mid substance tenderness. Negative calcaneal squeeze.     Assessment & Plan:   Assessment & Plan Kristie Martinez has continued to progress exceptionally well given the significance of her pain and imaging findings on ultrasound.  I recommend continuing physical therapy for now but holding off on progressing to dynamic activities/exercises until she has less than 2 days worth of post PT session soreness.  Continue heel lifts for now but plan to wean these as tolerated.  Follow-up in additional 6 weeks.  If patient experiences plateauing of improvement, consider shockwave therapy adjunct.

## 2024-11-19 ENCOUNTER — Other Ambulatory Visit: Payer: Self-pay

## 2024-11-19 ENCOUNTER — Other Ambulatory Visit (HOSPITAL_BASED_OUTPATIENT_CLINIC_OR_DEPARTMENT_OTHER): Payer: Self-pay

## 2024-11-19 DIAGNOSIS — M7660 Achilles tendinitis, unspecified leg: Secondary | ICD-10-CM

## 2024-11-19 DIAGNOSIS — S86012A Strain of left Achilles tendon, initial encounter: Secondary | ICD-10-CM

## 2024-11-20 ENCOUNTER — Other Ambulatory Visit (HOSPITAL_BASED_OUTPATIENT_CLINIC_OR_DEPARTMENT_OTHER): Payer: Self-pay

## 2024-11-20 MED ORDER — NAPROXEN 500 MG PO TABS
500.0000 mg | ORAL_TABLET | Freq: Two times a day (BID) | ORAL | 1 refills | Status: AC
  Filled 2024-11-20: qty 42, 21d supply, fill #0
  Filled 2024-12-06: qty 42, 21d supply, fill #1

## 2024-12-02 ENCOUNTER — Other Ambulatory Visit (HOSPITAL_BASED_OUTPATIENT_CLINIC_OR_DEPARTMENT_OTHER): Payer: Self-pay

## 2024-12-03 ENCOUNTER — Ambulatory Visit

## 2024-12-03 VITALS — BP 120/78 | Ht 66.0 in | Wt 190.0 lb

## 2024-12-03 DIAGNOSIS — M7660 Achilles tendinitis, unspecified leg: Secondary | ICD-10-CM

## 2024-12-03 DIAGNOSIS — S86012D Strain of left Achilles tendon, subsequent encounter: Secondary | ICD-10-CM | POA: Diagnosis not present

## 2024-12-03 NOTE — Progress Notes (Signed)
" ° °  Subjective:    Patient ID: Kristie Martinez, female    DOB: 64 y.o., [birth year]   MRN: [id number]  Chief Complaint: Left insertional Achilles tendinopathy (6-week follow-up)  Discussed the use of AI scribe software for clinical note transcription with the patient, who gave verbal consent to proceed.  History of Present Illness Patient reports approximately 90% improvement. She has played some pickle ball with little to no aftereffect though she has asked her husband to do most of the charging and due to concern for a sudden forward acceleration and straining Achilles again. Progressing well in physical therapy. Curious about next steps for progressing to full activity  Objective:   Vitals:   12/03/24 0811  BP: 120/78   Left ankle: Mild swelling present at the insertion of the Achilles which appears improved from prior.  Mild to minimal tenderness present in this area as well. No overlying skin changes. Able to do several single-leg calf raises without pain.     Assessment & Plan:   Assessment & Plan Indyah has progressed excellently through her rehab protocol for her insertional Achilles tendinopathy and partial Achilles tendon tear.  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  If she enjoys having this extra layer of stability during high output activities, she can wear this but I recommend taking it off for all other activities.  Continue physical therapy and plan for follow-up in 3 weeks to reassess her progress now that she is beginning high-output activities again.   "

## 2024-12-06 ENCOUNTER — Other Ambulatory Visit (HOSPITAL_BASED_OUTPATIENT_CLINIC_OR_DEPARTMENT_OTHER): Payer: Self-pay

## 2024-12-24 ENCOUNTER — Ambulatory Visit

## 2024-12-24 VITALS — BP 110/72 | Ht 66.0 in | Wt 190.0 lb

## 2024-12-24 DIAGNOSIS — M7660 Achilles tendinitis, unspecified leg: Secondary | ICD-10-CM

## 2024-12-24 DIAGNOSIS — S86012D Strain of left Achilles tendon, subsequent encounter: Secondary | ICD-10-CM

## 2024-12-24 NOTE — Progress Notes (Signed)
" ° °  Subjective:    Patient ID: Kristie Martinez, female    DOB: 64 y.o., Jul 25, 1961   MRN: [id number]  Chief Complaint: Left insertional Achilles tendinopathy (9-week follow-up)  History of Present Illness Had improvement 90% at last visit 3 weeks ago Initiated complete return to pickleball with lace up ankle brace. Has not been able to play as much pickleball recently due to the weather. Progressing through more dynamic activities with physical therapy and these have tested her more Feels more stable on her feet after initiating these.     Objective:   Vitals:   12/24/24 1303  BP: 110/72   Left ankle: Mild swelling present at the insertion of the Achilles which appears improved from prior.  No appreciable tenderness to palpation present in this area. No overlying skin changes. Able to do several single-leg calf raise without aid. Able to hop up and down on ipsilateral leg without pain.  She is able to do this with a similar amount of power output as the contralateral side, but it is slightly diminished     Assessment & Plan:   Assessment & Plan Kristie Martinez has progressed back excellently through activities/exercises for her insertional Achilles tendinopathy/partial thickness Achilles tear.  I recommend she continue her assiduous physical therapy work, and progress into full activity with pickleball with as needed use of the lace up ankle brace for comfort.  Follow-up as needed.   "
